# Patient Record
Sex: Female | Born: 1935 | Race: White | Hispanic: No | Marital: Married | Smoking: Never smoker
Health system: Southern US, Community
[De-identification: ages and names within clinical notes are randomized; demographics above are authoritative.]

## PROBLEM LIST (undated history)

## (undated) DIAGNOSIS — F419 Anxiety disorder, unspecified: Secondary | ICD-10-CM

## (undated) DIAGNOSIS — E785 Hyperlipidemia, unspecified: Secondary | ICD-10-CM

## (undated) DIAGNOSIS — F0390 Unspecified dementia without behavioral disturbance: Secondary | ICD-10-CM

## (undated) DIAGNOSIS — T7840XA Allergy, unspecified, initial encounter: Secondary | ICD-10-CM

## (undated) DIAGNOSIS — I10 Essential (primary) hypertension: Secondary | ICD-10-CM

## (undated) HISTORY — PX: VAGINAL HYSTERECTOMY: SUR661

## (undated) HISTORY — DX: Allergy, unspecified, initial encounter: T78.40XA

## (undated) HISTORY — DX: Anxiety disorder, unspecified: F41.9

## (undated) HISTORY — DX: Unspecified dementia, unspecified severity, without behavioral disturbance, psychotic disturbance, mood disturbance, and anxiety: F03.90

## (undated) HISTORY — PX: CATARACT EXTRACTION, BILATERAL: SHX1313

## (undated) HISTORY — PX: EYE SURGERY: SHX253

## (undated) HISTORY — DX: Essential (primary) hypertension: I10

## (undated) HISTORY — DX: Hyperlipidemia, unspecified: E78.5

---

## 2003-01-07 LAB — HM COLONOSCOPY

## 2004-07-01 ENCOUNTER — Ambulatory Visit: Payer: Self-pay | Admitting: Obstetrics and Gynecology

## 2005-07-07 ENCOUNTER — Ambulatory Visit: Payer: Self-pay | Admitting: Obstetrics and Gynecology

## 2006-07-10 ENCOUNTER — Ambulatory Visit: Payer: Self-pay | Admitting: Obstetrics and Gynecology

## 2007-07-16 ENCOUNTER — Ambulatory Visit: Payer: Self-pay | Admitting: Obstetrics and Gynecology

## 2007-12-25 ENCOUNTER — Ambulatory Visit: Payer: Self-pay | Admitting: Ophthalmology

## 2007-12-25 ENCOUNTER — Other Ambulatory Visit: Payer: Self-pay

## 2008-01-01 ENCOUNTER — Ambulatory Visit: Payer: Self-pay | Admitting: Ophthalmology

## 2008-07-17 ENCOUNTER — Ambulatory Visit: Payer: Self-pay | Admitting: Obstetrics and Gynecology

## 2009-04-06 ENCOUNTER — Ambulatory Visit: Payer: Self-pay | Admitting: Ophthalmology

## 2009-07-20 ENCOUNTER — Ambulatory Visit: Payer: Self-pay | Admitting: Obstetrics and Gynecology

## 2010-07-22 ENCOUNTER — Ambulatory Visit: Payer: Self-pay | Admitting: Obstetrics and Gynecology

## 2011-04-15 ENCOUNTER — Ambulatory Visit: Payer: Self-pay | Admitting: Family Medicine

## 2011-07-25 ENCOUNTER — Ambulatory Visit: Payer: Self-pay | Admitting: Obstetrics and Gynecology

## 2012-07-25 ENCOUNTER — Ambulatory Visit: Payer: Self-pay | Admitting: Obstetrics and Gynecology

## 2013-07-30 ENCOUNTER — Ambulatory Visit: Payer: Self-pay | Admitting: Family Medicine

## 2013-08-02 ENCOUNTER — Ambulatory Visit: Payer: Self-pay | Admitting: Family Medicine

## 2014-06-18 LAB — BASIC METABOLIC PANEL
CREATININE: 1.1 mg/dL (ref <=1.1)
GLUCOSE: 111 mg/dL

## 2014-06-18 LAB — LIPID PANEL
CHOLESTEROL: 202 mg/dL — AB (ref 0–200)
HDL: 66 mg/dL (ref 35–70)
LDL Cholesterol: 113 mg/dL
TRIGLYCERIDES: 115 mg/dL (ref 40–160)

## 2014-08-05 ENCOUNTER — Ambulatory Visit: Payer: Self-pay | Admitting: Family Medicine

## 2014-08-05 LAB — HM MAMMOGRAPHY: HM Mammogram: NORMAL

## 2014-11-13 DIAGNOSIS — E7849 Other hyperlipidemia: Secondary | ICD-10-CM | POA: Insufficient documentation

## 2014-11-13 DIAGNOSIS — F419 Anxiety disorder, unspecified: Secondary | ICD-10-CM | POA: Insufficient documentation

## 2014-11-13 DIAGNOSIS — Z0000 Encounter for general adult medical examination without abnormal findings: Secondary | ICD-10-CM | POA: Insufficient documentation

## 2014-11-13 DIAGNOSIS — I10 Essential (primary) hypertension: Secondary | ICD-10-CM | POA: Insufficient documentation

## 2014-11-13 DIAGNOSIS — Z1331 Encounter for screening for depression: Secondary | ICD-10-CM | POA: Insufficient documentation

## 2014-11-13 DIAGNOSIS — J309 Allergic rhinitis, unspecified: Secondary | ICD-10-CM | POA: Insufficient documentation

## 2014-11-13 DIAGNOSIS — N951 Menopausal and female climacteric states: Secondary | ICD-10-CM | POA: Insufficient documentation

## 2014-12-19 ENCOUNTER — Ambulatory Visit: Payer: Self-pay | Admitting: Family Medicine

## 2014-12-23 ENCOUNTER — Encounter: Payer: Self-pay | Admitting: Family Medicine

## 2014-12-23 ENCOUNTER — Ambulatory Visit (INDEPENDENT_AMBULATORY_CARE_PROVIDER_SITE_OTHER): Payer: Medicare Other | Admitting: Family Medicine

## 2014-12-23 VITALS — BP 102/62 | HR 64 | Ht 64.0 in | Wt 169.0 lb

## 2014-12-23 DIAGNOSIS — E784 Other hyperlipidemia: Secondary | ICD-10-CM | POA: Diagnosis not present

## 2014-12-23 DIAGNOSIS — I10 Essential (primary) hypertension: Secondary | ICD-10-CM | POA: Diagnosis not present

## 2014-12-23 DIAGNOSIS — F419 Anxiety disorder, unspecified: Secondary | ICD-10-CM | POA: Diagnosis not present

## 2014-12-23 DIAGNOSIS — J301 Allergic rhinitis due to pollen: Secondary | ICD-10-CM | POA: Diagnosis not present

## 2014-12-23 DIAGNOSIS — E7849 Other hyperlipidemia: Secondary | ICD-10-CM

## 2014-12-23 MED ORDER — METOPROLOL SUCCINATE ER 50 MG PO TB24: 50.0000 mg | ORAL_TABLET | Freq: Every day | ORAL | Status: DC

## 2014-12-23 MED ORDER — CLORAZEPATE DIPOTASSIUM 7.5 MG PO TABS: 7.5000 mg | ORAL_TABLET | Freq: Every day | ORAL | Status: DC

## 2014-12-23 MED ORDER — HYDROCHLOROTHIAZIDE 12.5 MG PO CAPS: 12.5000 mg | ORAL_CAPSULE | Freq: Every day | ORAL | Status: DC

## 2014-12-23 MED ORDER — OMEGA 3 1000 MG PO CAPS: 2.0000 | ORAL_CAPSULE | Freq: Two times a day (BID) | ORAL | Status: DC

## 2014-12-23 MED ORDER — LISINOPRIL 20 MG PO TABS: 20.0000 mg | ORAL_TABLET | Freq: Every day | ORAL | Status: DC

## 2014-12-23 NOTE — Progress Notes (Signed)
Name: Karen Hooper   MRN: [id number]    DOB: 1932-03-18   Date:12/23/2014       Progress Note  Subjective  Chief Complaint  Chief Complaint  Patient presents with  . Hypertension  . Panic Attack  . Allergic Rhinitis     Hypertension This is a chronic problem. The current episode started more than 1 year ago. The problem is unchanged. The problem is controlled. Associated symptoms include anxiety. Pertinent negatives include no blurred vision, chest pain, headaches, malaise/fatigue, neck pain, orthopnea, palpitations, peripheral edema, PND, shortness of breath or sweats. There are no associated agents to hypertension. Risk factors for coronary artery disease include dyslipidemia and post-menopausal state. Past treatments include nothing. The current treatment provides mild improvement. There are no compliance problems.  There is no history of angina, CAD/MI, CVA, heart failure, left ventricular hypertrophy, PVD or retinopathy. There is no history of chronic renal disease.  Hyperlipidemia This is a chronic problem. The current episode started more than 1 year ago. The problem is controlled. Recent lipid tests were reviewed and are variable. She has no history of chronic renal disease. Pertinent negatives include no chest pain, focal weakness, myalgias or shortness of breath. Current antihyperlipidemic treatment includes diet change. The current treatment provides moderate improvement of lipids. There are no compliance problems.   Anxiety Presents for follow-up visit. Symptoms include excessive worry. Patient reports no chest pain, dizziness, insomnia, irritability, malaise, nausea, nervous/anxious behavior, palpitations, shortness of breath or suicidal ideas. Symptoms occur occasionally. The severity of symptoms is mild. The symptoms are aggravated by family issues. The quality of sleep is fair.   There is no history of anxiety/panic attacks. Past treatments include benzodiazephines. Compliance  with prior treatments has been good.    No problem-specific assessment & plan notes found for this encounter.   Past Medical History  Diagnosis Date  . Allergy   . Anxiety   . Hypertension   . Hyperlipidemia     Past Surgical History  Procedure Laterality Date  . Vaginal hysterectomy    . Cataract extraction, bilateral      History reviewed. No pertinent family history.  History   Social History  . Marital Status: Married    Spouse Name: N/A  . Number of Children: N/A  . Years of Education: N/A   Occupational History  . Not on file.   Social History Main Topics  . Smoking status: Never Smoker   . Smokeless tobacco: Not on file  . Alcohol Use: No  . Drug Use: No  . Sexual Activity: Not Currently    Birth Control/ Protection: Abstinence   Other Topics Concern  . Not on file   Social History Narrative    Allergies  Allergen Reactions  . Atorvastatin   . Etodolac   . Ezetimibe      Review of Systems  Constitutional: Negative for fever, chills, weight loss, malaise/fatigue and irritability.  HENT: Negative for ear discharge, ear pain and sore throat.   Eyes: Negative for blurred vision.  Respiratory: Negative for cough, sputum production, shortness of breath and wheezing.   Cardiovascular: Negative for chest pain, palpitations, orthopnea, leg swelling and PND.  Gastrointestinal: Negative for heartburn, nausea, abdominal pain, diarrhea, constipation, blood in stool and melena.  Genitourinary: Negative for dysuria, urgency, frequency and hematuria.  Musculoskeletal: Negative for myalgias, back pain, joint pain and neck pain.  Skin: Negative for rash.  Neurological: Negative for dizziness, tingling, sensory change, focal weakness and headaches.  Endo/Heme/Allergies: Negative  for environmental allergies and polydipsia. Does not bruise/bleed easily.  Psychiatric/Behavioral: Negative for depression and suicidal ideas. The patient is not nervous/anxious and does  not have insomnia.      Objective  Filed Vitals:   12/23/14 0851  BP: 102/62  Pulse: 64  Height: 5\' 4"  (1.626 m)  Weight: 169 lb (76.658 kg)    Physical Exam  Constitutional: She is well-developed, well-nourished, and in no distress. No distress.  HENT:  Head: Normocephalic and atraumatic.  Right Ear: External ear normal.  Left Ear: External ear normal.  Nose: Nose normal.  Mouth/Throat: Oropharynx is clear and moist.  Eyes: Conjunctivae and EOM are normal. Pupils are equal, round, and reactive to light. Right eye exhibits no discharge. Left eye exhibits no discharge.  Neck: Normal range of motion. Neck supple. No JVD present. No thyromegaly present.  Cardiovascular: Normal rate, regular rhythm, normal heart sounds and intact distal pulses.  Exam reveals no gallop and no friction rub.   No murmur heard. Pulmonary/Chest: Effort normal and breath sounds normal. No respiratory distress. She has no wheezes. She has no rales. She exhibits no tenderness.  Abdominal: Soft. Bowel sounds are normal. She exhibits no distension and no mass. There is no tenderness. There is no rebound and no guarding.  Musculoskeletal: Normal range of motion. She exhibits no edema.  Lymphadenopathy:    She has no cervical adenopathy.  Neurological: She is alert. She has normal reflexes. No cranial nerve deficit.  Skin: Skin is warm and dry. No rash noted.  Psychiatric: Mood and affect normal.  Nursing note and vitals reviewed.     No results found for this or any previous visit (from the past 2160 hour(s)).   Assessment & Plan  Problem List Items Addressed This Visit      Cardiovascular and Mediastinum   BP (high blood pressure) - Primary   Relevant Medications   hydrochlorothiazide (MICROZIDE) 12.5 MG capsule   lisinopril (PRINIVIL,ZESTRIL) 20 MG tablet   metoprolol succinate (TOPROL-XL) 50 MG 24 hr tablet   Other Relevant Orders   Renal Function Panel     Respiratory   Allergic rhinitis      Other   Familial multiple lipoprotein-type hyperlipidemia   Relevant Medications   hydrochlorothiazide (MICROZIDE) 12.5 MG capsule   lisinopril (PRINIVIL,ZESTRIL) 20 MG tablet   metoprolol succinate (TOPROL-XL) 50 MG 24 hr tablet   Omega 3 1000 MG CAPS   Other Relevant Orders   Lipid Profile   Anxiety   Relevant Medications   clorazepate (TRANXENE) 7.5 MG tablet        Dr. [facility]  12/23/2014

## 2014-12-24 LAB — RENAL FUNCTION PANEL
Albumin: 4.4 g/dL (ref 3.5–4.7)
BUN/Creatinine Ratio: 28 — ABNORMAL HIGH (ref 11–26)
BUN: 29 mg/dL — ABNORMAL HIGH (ref 8–27)
CALCIUM: 10.7 mg/dL — AB (ref 8.7–10.3)
CO2: 23 mmol/L (ref 18–29)
CREATININE: 1.05 mg/dL — AB (ref 0.57–1.00)
Chloride: 106 mmol/L (ref 97–108)
GFR calc Af Amer: 58 mL/min/{1.73_m2} — ABNORMAL LOW (ref >59)
GFR calc non Af Amer: 50 mL/min/{1.73_m2} — ABNORMAL LOW (ref >59)
GLUCOSE: 113 mg/dL — AB (ref 65–99)
Phosphorus: 3.7 mg/dL (ref 2.5–4.5)
Potassium: 4.5 mmol/L (ref 3.5–5.2)
Sodium: 143 mmol/L (ref 134–144)

## 2014-12-24 LAB — LIPID PANEL
CHOL/HDL RATIO: 3.4 ratio (ref 0.0–4.4)
Cholesterol, Total: 223 mg/dL — ABNORMAL HIGH (ref 100–199)
HDL: 66 mg/dL (ref >39)
LDL CALC: 128 mg/dL — AB (ref 0–99)
TRIGLYCERIDES: 145 mg/dL (ref 0–149)
VLDL Cholesterol Cal: 29 mg/dL (ref 5–40)

## 2015-01-05 ENCOUNTER — Encounter: Payer: Self-pay | Admitting: Family Medicine

## 2015-01-05 ENCOUNTER — Ambulatory Visit (INDEPENDENT_AMBULATORY_CARE_PROVIDER_SITE_OTHER): Payer: Medicare Other | Admitting: Family Medicine

## 2015-01-05 VITALS — BP 120/80 | HR 72 | Temp 98.1°F | Ht 64.0 in | Wt 169.0 lb

## 2015-01-05 DIAGNOSIS — J0100 Acute maxillary sinusitis, unspecified: Secondary | ICD-10-CM | POA: Diagnosis not present

## 2015-01-05 DIAGNOSIS — J029 Acute pharyngitis, unspecified: Secondary | ICD-10-CM

## 2015-01-05 MED ORDER — AMOXICILLIN 500 MG PO CAPS: 500.0000 mg | ORAL_CAPSULE | Freq: Three times a day (TID) | ORAL | Status: DC

## 2015-01-05 NOTE — Progress Notes (Signed)
Name: Karen Hooper   MRN: [id number]    DOB: 06/14/1932   Date:01/05/2015       Progress Note  Subjective  Chief Complaint  Chief Complaint  Patient presents with  . Sinusitis    headache, runnny nose and sore throat     Sinusitis This is a recurrent problem. The current episode started in the past 7 days. The problem has been waxing and waning since onset. There has been no fever. She is experiencing no pain. Associated symptoms include chills, neck pain and sinus pressure. Pertinent negatives include no congestion, coughing, diaphoresis, ear pain, headaches, hoarse voice, shortness of breath, sneezing, sore throat or swollen glands. Past treatments include acetaminophen. The treatment provided mild relief.  Sore Throat  This is a new problem. The current episode started in the past 7 days. The problem has been gradually worsening. There has been no fever. Associated symptoms include neck pain. Pertinent negatives include no congestion, coughing, diarrhea, ear pain, headaches, hoarse voice, shortness of breath or swollen glands. She has had no exposure to strep. She has tried acetaminophen for the symptoms. The treatment provided mild relief.    No problem-specific assessment & plan notes found for this encounter.   Past Medical History  Diagnosis Date  . Allergy   . Anxiety   . Hypertension   . Hyperlipidemia     Past Surgical History  Procedure Laterality Date  . Vaginal hysterectomy    . Cataract extraction, bilateral      History reviewed. No pertinent family history.  History   Social History  . Marital Status: Married    Spouse Name: N/A  . Number of Children: N/A  . Years of Education: N/A   Occupational History  . Not on file.   Social History Main Topics  . Smoking status: Never Smoker   . Smokeless tobacco: Not on file  . Alcohol Use: No  . Drug Use: No  . Sexual Activity: Not Currently    Birth Control/ Protection: Abstinence   Other Topics  Concern  . Not on file   Social History Narrative    Allergies  Allergen Reactions  . Atorvastatin   . Etodolac   . Ezetimibe      Review of Systems  Constitutional: Positive for chills. Negative for fever, weight loss and diaphoresis.  HENT: Positive for sinus pressure. Negative for congestion, ear pain, hoarse voice, sneezing and sore throat.   Eyes: Negative for blurred vision, double vision and redness.  Respiratory: Negative for cough, hemoptysis, sputum production, shortness of breath and wheezing.   Cardiovascular: Negative for chest pain, palpitations, orthopnea and claudication.  Gastrointestinal: Negative for nausea, diarrhea and melena.  Genitourinary: Negative for dysuria, urgency, frequency and hematuria.  Musculoskeletal: Positive for neck pain. Negative for myalgias.  Skin: Negative for rash.  Neurological: Negative for dizziness, weakness and headaches.  Endo/Heme/Allergies: Negative for environmental allergies. Does not bruise/bleed easily.     Objective  Filed Vitals:   01/05/15 0906  BP: 120/80  Pulse: 72  Temp: 98.1 F (36.7 C)  Height: 5\' 4"  (1.626 m)  Weight: 169 lb (76.658 kg)    Physical Exam  Constitutional: She is well-developed, well-nourished, and in no distress. No distress.  HENT:  Head: Normocephalic and atraumatic.  Right Ear: External ear normal.  Left Ear: External ear normal.  Nose: Nose normal.  Mouth/Throat: Uvula is midline and mucous membranes are normal. Posterior oropharyngeal erythema present. No oropharyngeal exudate or posterior oropharyngeal edema.  Eyes:  Conjunctivae and EOM are normal. Pupils are equal, round, and reactive to light. Right eye exhibits no discharge. Left eye exhibits no discharge.  Neck: Normal range of motion. Neck supple. No JVD present. No thyromegaly present.  Cardiovascular: Normal rate, regular rhythm, normal heart sounds and intact distal pulses.  Exam reveals no gallop and no friction rub.   No  murmur heard. Pulmonary/Chest: Effort normal and breath sounds normal.  Abdominal: Soft. Bowel sounds are normal. She exhibits no mass. There is no tenderness. There is no guarding.  Musculoskeletal: Normal range of motion. She exhibits no edema.  Lymphadenopathy:    She has no cervical adenopathy.  Neurological: She is alert. She has normal reflexes.  Skin: Skin is warm and dry. She is not diaphoretic.  Psychiatric: Mood and affect normal.      Assessment & Plan  Problem List Items Addressed This Visit    None    Visit Diagnoses    Pharyngitis    -  Primary    Relevant Medications    amoxicillin (AMOXIL) 500 MG capsule    Acute maxillary sinusitis, recurrence not specified        Relevant Medications    amoxicillin (AMOXIL) 500 MG capsule         Dr. [facility]  01/05/2015

## 2015-02-17 ENCOUNTER — Encounter: Payer: Self-pay | Admitting: Family Medicine

## 2015-02-17 ENCOUNTER — Ambulatory Visit (INDEPENDENT_AMBULATORY_CARE_PROVIDER_SITE_OTHER): Payer: Medicare Other | Admitting: Family Medicine

## 2015-02-17 VITALS — BP 100/60 | HR 80 | Ht 64.0 in | Wt 168.0 lb

## 2015-02-17 DIAGNOSIS — J029 Acute pharyngitis, unspecified: Secondary | ICD-10-CM | POA: Diagnosis not present

## 2015-02-17 MED ORDER — AZITHROMYCIN 250 MG PO TABS: ORAL_TABLET | ORAL | Status: DC

## 2015-02-17 NOTE — Progress Notes (Signed)
Name: Karen Hooper   MRN: [id number]    DOB: 1931-12-09   Date:02/17/2015       Progress Note  Subjective  Chief Complaint  Chief Complaint  Patient presents with  . Sore Throat    hurts to swallow x 1 day    Sore Throat  This is a new problem. The current episode started yesterday. The problem has been gradually worsening. There has been no fever. Associated symptoms include congestion, coughing and ear pain. Pertinent negatives include no abdominal pain, diarrhea, drooling, ear discharge, headaches, hoarse voice, plugged ear sensation, neck pain, shortness of breath, swollen glands, trouble swallowing or vomiting. She has had no exposure to strep or mono. She has tried acetaminophen for the symptoms. The treatment provided mild relief.    No problem-specific assessment & plan notes found for this encounter.   Past Medical History  Diagnosis Date  . Allergy   . Anxiety   . Hypertension   . Hyperlipidemia     Past Surgical History  Procedure Laterality Date  . Vaginal hysterectomy    . Cataract extraction, bilateral      History reviewed. No pertinent family history.  History   Social History  . Marital Status: Married    Spouse Name: N/A  . Number of Children: N/A  . Years of Education: N/A   Occupational History  . Not on file.   Social History Main Topics  . Smoking status: Never Smoker   . Smokeless tobacco: Not on file  . Alcohol Use: No  . Drug Use: No  . Sexual Activity: Not Currently    Birth Control/ Protection: Abstinence   Other Topics Concern  . Not on file   Social History Narrative    Allergies  Allergen Reactions  . Atorvastatin   . Etodolac   . Ezetimibe      Review of Systems  Constitutional: Negative for fever, chills, weight loss and malaise/fatigue.  HENT: Positive for congestion and ear pain. Negative for drooling, ear discharge, hoarse voice, sore throat and trouble swallowing.   Eyes: Negative for blurred vision.   Respiratory: Positive for cough. Negative for sputum production, shortness of breath and wheezing.   Cardiovascular: Negative for chest pain, palpitations and leg swelling.  Gastrointestinal: Negative for heartburn, nausea, vomiting, abdominal pain, diarrhea, constipation, blood in stool and melena.  Genitourinary: Negative for dysuria, urgency, frequency and hematuria.  Musculoskeletal: Negative for myalgias, back pain, joint pain and neck pain.  Skin: Negative for rash.  Neurological: Negative for dizziness, tingling, sensory change, focal weakness and headaches.  Endo/Heme/Allergies: Negative for environmental allergies and polydipsia. Does not bruise/bleed easily.  Psychiatric/Behavioral: Negative for depression and suicidal ideas. The patient is not nervous/anxious and does not have insomnia.      Objective  Filed Vitals:   02/17/15 1416  BP: 100/60  Pulse: 80  Height:  (1.626 m)  Weight: 168 lb (76.204 kg)    Physical Exam  Constitutional: She is well-developed, well-nourished, and in no distress. No distress.  HENT:  Head: Normocephalic and atraumatic.  Right Ear: External ear normal.  Left Ear: External ear normal.  Nose: Nose normal.  Mouth/Throat: Posterior oropharyngeal edema and posterior oropharyngeal erythema present. No oropharyngeal exudate.  Eyes: Conjunctivae and EOM are normal. Pupils are equal, round, and reactive to light. Right eye exhibits no discharge. Left eye exhibits no discharge.  Neck: Normal range of motion. Neck supple. No JVD present. No thyromegaly present.  Cardiovascular: Normal rate, regular rhythm, normal  heart sounds and intact distal pulses.  Exam reveals no gallop and no friction rub.   No murmur heard. Pulmonary/Chest: Effort normal and breath sounds normal.  Abdominal: Soft. Bowel sounds are normal. She exhibits no mass. There is no tenderness. There is no guarding.  Musculoskeletal: Normal range of motion. She exhibits no edema.   Lymphadenopathy:    She has no cervical adenopathy.  Neurological: She is alert. She has normal reflexes.  Skin: Skin is warm and dry. She is not diaphoretic.  Psychiatric: Mood and affect normal.      Assessment & Plan  Problem List Items Addressed This Visit    None    Visit Diagnoses    Pharyngitis    -  Primary    Relevant Medications    azithromycin (ZITHROMAX) 250 MG tablet         Dr. [facility]  02/17/2015

## 2015-03-13 ENCOUNTER — Other Ambulatory Visit: Payer: Self-pay | Admitting: Family Medicine

## 2015-03-13 DIAGNOSIS — I10 Essential (primary) hypertension: Secondary | ICD-10-CM

## 2015-04-21 ENCOUNTER — Ambulatory Visit (INDEPENDENT_AMBULATORY_CARE_PROVIDER_SITE_OTHER): Payer: Medicare Other

## 2015-04-21 DIAGNOSIS — Z23 Encounter for immunization: Secondary | ICD-10-CM | POA: Diagnosis not present

## 2015-05-21 ENCOUNTER — Encounter: Payer: Self-pay | Admitting: Family Medicine

## 2015-05-21 ENCOUNTER — Ambulatory Visit (INDEPENDENT_AMBULATORY_CARE_PROVIDER_SITE_OTHER): Payer: Medicare Other | Admitting: Family Medicine

## 2015-05-21 VITALS — BP 120/60 | HR 68 | Temp 99.2°F | Ht 64.0 in | Wt 167.0 lb

## 2015-05-21 DIAGNOSIS — J0100 Acute maxillary sinusitis, unspecified: Secondary | ICD-10-CM | POA: Diagnosis not present

## 2015-05-21 DIAGNOSIS — J40 Bronchitis, not specified as acute or chronic: Secondary | ICD-10-CM | POA: Diagnosis not present

## 2015-05-21 MED ORDER — AMOXICILLIN 500 MG PO CAPS: 500.0000 mg | ORAL_CAPSULE | Freq: Three times a day (TID) | ORAL | Status: DC

## 2015-05-21 MED ORDER — GUAIFENESIN-CODEINE 100-10 MG/5ML PO SOLN
5.0000 mL | Freq: Three times a day (TID) | ORAL | Status: DC | PRN
Start: 2015-05-21 — End: 2015-06-24

## 2015-05-21 NOTE — Progress Notes (Signed)
Name: Karen Hooper   MRN: [id number]    DOB: [birth year]   Date:05/21/2015       Progress Note  Subjective  Chief Complaint  Chief Complaint  Patient presents with  . Sinusitis    Sinusitis This is a new problem. The current episode started in the past 7 days. The problem has been gradually worsening since onset. The maximum temperature recorded prior to her arrival was 100.4 - 100.9 F. The fever has been present for 1 to 2 days. The pain is mild. Associated symptoms include congestion, coughing, shortness of breath, sinus pressure and swollen glands. Pertinent negatives include no chills, diaphoresis, ear pain, headaches, hoarse voice, neck pain, sneezing or sore throat. Past treatments include acetaminophen.    No problem-specific assessment & plan notes found for this encounter.   Past Medical History  Diagnosis Date  . Allergy   . Anxiety   . Hypertension   . Hyperlipidemia     Past Surgical History  Procedure Laterality Date  . Vaginal hysterectomy    . Cataract extraction, bilateral      History reviewed. No pertinent family history.  Social History   Social History  . Marital Status: Married    Spouse Name: N/A  . Number of Children: N/A  . Years of Education: N/A   Occupational History  . Not on file.   Social History Main Topics  . Smoking status: Never Smoker   . Smokeless tobacco: Not on file  . Alcohol Use: No  . Drug Use: No  . Sexual Activity: Not Currently    Birth Control/ Protection: Abstinence   Other Topics Concern  . Not on file   Social History Narrative    Allergies  Allergen Reactions  . Atorvastatin   . Etodolac   . Ezetimibe      Review of Systems  Constitutional: Negative for fever, chills, weight loss, malaise/fatigue and diaphoresis.  HENT: Positive for congestion and sinus pressure. Negative for ear discharge, ear pain, hoarse voice, sneezing and sore throat.   Eyes: Negative for blurred vision.  Respiratory:  Positive for cough and shortness of breath. Negative for sputum production and wheezing.   Cardiovascular: Negative for chest pain, palpitations and leg swelling.  Gastrointestinal: Negative for heartburn, nausea, abdominal pain, diarrhea, constipation, blood in stool and melena.  Genitourinary: Negative for dysuria, urgency, frequency and hematuria.  Musculoskeletal: Negative for myalgias, back pain, joint pain and neck pain.  Skin: Negative for rash.  Neurological: Negative for dizziness, tingling, sensory change, focal weakness and headaches.  Endo/Heme/Allergies: Negative for environmental allergies and polydipsia. Does not bruise/bleed easily.  Psychiatric/Behavioral: Negative for depression and suicidal ideas. The patient is not nervous/anxious and does not have insomnia.      Objective  Filed Vitals:   05/21/15 1100  BP: 120/60  Pulse: 68  Temp: 99.2 F (37.3 C)  TempSrc: Oral  Height:  (1.626 m)  Weight: 167 lb (75.751 kg)    Physical Exam  Constitutional: She is well-developed, well-nourished, and in no distress. No distress.  HENT:  Head: Normocephalic and atraumatic.  Right Ear: External ear normal.  Left Ear: External ear normal.  Nose: Nose normal.  Mouth/Throat: Oropharynx is clear and moist.  Eyes: Conjunctivae and EOM are normal. Pupils are equal, round, and reactive to light. Right eye exhibits no discharge. Left eye exhibits no discharge.  Neck: Normal range of motion. Neck supple. No JVD present. No thyromegaly present.  Cardiovascular: Normal rate, regular rhythm, normal heart  sounds and intact distal pulses.  Exam reveals no gallop and no friction rub.   No murmur heard. Pulmonary/Chest: Effort normal and breath sounds normal.  Abdominal: Soft. Bowel sounds are normal. She exhibits no mass. There is no tenderness. There is no guarding.  Musculoskeletal: Normal range of motion. She exhibits no edema.  Lymphadenopathy:    She has no cervical  adenopathy.  Neurological: She is alert.  Skin: Skin is warm and dry. She is not diaphoretic.  Psychiatric: Mood and affect normal.  Nursing note and vitals reviewed.     Assessment & Plan  Problem List Items Addressed This Visit    None    Visit Diagnoses    Acute maxillary sinusitis, recurrence not specified    -  Primary    Relevant Medications    amoxicillin (AMOXIL) 500 MG capsule    guaiFENesin-codeine 100-10 MG/5ML syrup    Bronchitis        Relevant Medications    amoxicillin (AMOXIL) 500 MG capsule    guaiFENesin-codeine 100-10 MG/5ML syrup         Dr. Hayden Rasmusseneanna Denyla Cortese [facility]  05/21/2015

## 2015-06-24 ENCOUNTER — Encounter: Payer: Self-pay | Admitting: Family Medicine

## 2015-06-24 ENCOUNTER — Ambulatory Visit (INDEPENDENT_AMBULATORY_CARE_PROVIDER_SITE_OTHER): Payer: Medicare Other | Admitting: Family Medicine

## 2015-06-24 VITALS — BP 100/62 | HR 70 | Ht 64.0 in | Wt 167.0 lb

## 2015-06-24 DIAGNOSIS — F419 Anxiety disorder, unspecified: Secondary | ICD-10-CM | POA: Diagnosis not present

## 2015-06-24 DIAGNOSIS — I10 Essential (primary) hypertension: Secondary | ICD-10-CM | POA: Diagnosis not present

## 2015-06-24 DIAGNOSIS — E7849 Other hyperlipidemia: Secondary | ICD-10-CM

## 2015-06-24 DIAGNOSIS — E784 Other hyperlipidemia: Secondary | ICD-10-CM

## 2015-06-24 MED ORDER — LISINOPRIL 20 MG PO TABS: ORAL_TABLET | ORAL | Status: DC

## 2015-06-24 MED ORDER — HYDROCHLOROTHIAZIDE 12.5 MG PO CAPS: 12.5000 mg | ORAL_CAPSULE | Freq: Every day | ORAL | Status: DC

## 2015-06-24 MED ORDER — CLORAZEPATE DIPOTASSIUM 7.5 MG PO TABS: 7.5000 mg | ORAL_TABLET | Freq: Every day | ORAL | Status: DC

## 2015-06-24 MED ORDER — METOPROLOL SUCCINATE ER 50 MG PO TB24: 50.0000 mg | ORAL_TABLET | Freq: Every day | ORAL | Status: DC

## 2015-06-24 NOTE — Progress Notes (Signed)
Name: Karen Hooper   MRN: [id number]    DOB: 07/20/1931   Date:06/24/2015       Progress Note  Subjective  Chief Complaint  Chief Complaint  Patient presents with  . Hypertension  . Allergic Rhinitis   . Anxiety    Hypertension This is a chronic problem. The current episode started more than 1 year ago. The problem has been gradually improving since onset. The problem is controlled. Associated symptoms include anxiety. Pertinent negatives include no blurred vision, chest pain, headaches, malaise/fatigue, neck pain, orthopnea, palpitations, peripheral edema, PND, shortness of breath or sweats. There are no associated agents to hypertension. There are no known risk factors for coronary artery disease. Past treatments include beta blockers, ACE inhibitors and diuretics. The current treatment provides moderate improvement. There are no compliance problems.  There is no history of angina, kidney disease, CAD/MI, CVA, heart failure, left ventricular hypertrophy, PVD or retinopathy. There is no history of chronic renal disease or a hypertension causing med.  Anxiety Presents for follow-up visit. The problem has been gradually improving. Symptoms include excessive worry, irritability and nervous/anxious behavior. Patient reports no chest pain, compulsions, confusion, decreased concentration, depressed mood, dizziness, dry mouth, feeling of choking, hyperventilation, impotence, insomnia, malaise, muscle tension, nausea, obsessions, palpitations, panic, restlessness, shortness of breath or suicidal ideas. Symptoms occur most days. The severity of symptoms is moderate. Exacerbated by: stress. The quality of sleep is good. Nighttime awakenings: none.   There is no history of anemia, anxiety/panic attacks, arrhythmia, asthma, bipolar disorder, CAD, CHF, chronic lung disease, depression, fibromyalgia, hyperthyroidism or suicide attempts. Past treatments include benzodiazephines. Compliance with prior  treatments has been good.    No problem-specific assessment & plan notes found for this encounter.   Past Medical History  Diagnosis Date  . Allergy   . Anxiety   . Hypertension   . Hyperlipidemia     Past Surgical History  Procedure Laterality Date  . Vaginal hysterectomy    . Cataract extraction, bilateral      History reviewed. No pertinent family history.  Social History   Social History  . Marital Status: Married    Spouse Name: N/A  . Number of Children: N/A  . Years of Education: N/A   Occupational History  . Not on file.   Social History Main Topics  . Smoking status: Never Smoker   . Smokeless tobacco: Not on file  . Alcohol Use: No  . Drug Use: No  . Sexual Activity: Not Currently    Birth Control/ Protection: Abstinence   Other Topics Concern  . Not on file   Social History Narrative    Allergies  Allergen Reactions  . Atorvastatin   . Etodolac   . Ezetimibe      Review of Systems  Constitutional: Positive for irritability. Negative for fever, chills, weight loss and malaise/fatigue.  HENT: Negative for ear discharge, ear pain and sore throat.   Eyes: Negative for blurred vision.  Respiratory: Negative for cough, sputum production, shortness of breath and wheezing.   Cardiovascular: Negative for chest pain, palpitations, orthopnea, leg swelling and PND.  Gastrointestinal: Negative for heartburn, nausea, abdominal pain, diarrhea, constipation, blood in stool and melena.  Genitourinary: Negative for dysuria, urgency, frequency, hematuria and impotence.  Musculoskeletal: Negative for myalgias, back pain, joint pain and neck pain.  Skin: Negative for rash.  Neurological: Negative for dizziness, tingling, sensory change, focal weakness and headaches.  Endo/Heme/Allergies: Negative for environmental allergies and polydipsia. Does not bruise/bleed easily.  Psychiatric/Behavioral: Negative for depression, suicidal ideas, confusion and decreased  concentration. The patient is nervous/anxious. The patient does not have insomnia.      Objective  Filed Vitals:   06/24/15 0802  BP: 100/62  Pulse: 70  Height:  (1.626 m)  Weight: 167 lb (75.751 kg)    Physical Exam  Constitutional: She is well-developed, well-nourished, and in no distress. No distress.  HENT:  Head: Normocephalic and atraumatic.  Right Ear: External ear normal.  Left Ear: External ear normal.  Nose: Nose normal.  Mouth/Throat: Oropharynx is clear and moist.  Eyes: Conjunctivae and EOM are normal. Pupils are equal, round, and reactive to light. Right eye exhibits no discharge. Left eye exhibits no discharge.  Neck: Normal range of motion. Neck supple. No JVD present. No thyromegaly present.  Cardiovascular: Normal rate, regular rhythm, normal heart sounds and intact distal pulses.  Exam reveals no gallop and no friction rub.   No murmur heard. Pulmonary/Chest: Effort normal and breath sounds normal.  Abdominal: Soft. Bowel sounds are normal. She exhibits no mass. There is no tenderness. There is no guarding.  Musculoskeletal: Normal range of motion. She exhibits no edema.  Lymphadenopathy:    She has no cervical adenopathy.  Neurological: She is alert.  Skin: Skin is warm and dry. She is not diaphoretic.  Psychiatric: Mood and affect normal.      Assessment & Plan  Problem List Items Addressed This Visit      Cardiovascular and Mediastinum   BP (high blood pressure) - Primary   Relevant Medications   lisinopril (PRINIVIL,ZESTRIL) 20 MG tablet   metoprolol succinate (TOPROL-XL) 50 MG 24 hr tablet   hydrochlorothiazide (MICROZIDE) 12.5 MG capsule   Other Relevant Orders   Renal Function Panel     Other   Familial multiple lipoprotein-type hyperlipidemia   Relevant Medications   lisinopril (PRINIVIL,ZESTRIL) 20 MG tablet   metoprolol succinate (TOPROL-XL) 50 MG 24 hr tablet   hydrochlorothiazide (MICROZIDE) 12.5 MG capsule   Other Relevant  Orders   Lipid Profile   Anxiety   Relevant Medications   clorazepate (TRANXENE) 7.5 MG tablet        Dr. [facility]  06/24/2015

## 2015-06-25 ENCOUNTER — Encounter: Payer: Self-pay | Admitting: Family Medicine

## 2015-06-25 DIAGNOSIS — N183 Chronic kidney disease, stage 3 unspecified: Secondary | ICD-10-CM | POA: Insufficient documentation

## 2015-06-25 LAB — RENAL FUNCTION PANEL
Albumin: 4.2 g/dL (ref 3.5–4.7)
BUN / CREAT RATIO: 28 — AB (ref 11–26)
BUN: 32 mg/dL — AB (ref 8–27)
CO2: 23 mmol/L (ref 18–29)
CREATININE: 1.14 mg/dL — AB (ref 0.57–1.00)
Calcium: 10 mg/dL (ref 8.7–10.3)
Chloride: 106 mmol/L (ref 96–106)
GFR, EST AFRICAN AMERICAN: 52 mL/min/{1.73_m2} — AB (ref >59)
GFR, EST NON AFRICAN AMERICAN: 45 mL/min/{1.73_m2} — AB (ref >59)
Glucose: 94 mg/dL (ref 65–99)
Phosphorus: 3.4 mg/dL (ref 2.5–4.5)
Potassium: 4.7 mmol/L (ref 3.5–5.2)
Sodium: 144 mmol/L (ref 134–144)

## 2015-06-25 LAB — LIPID PANEL
CHOL/HDL RATIO: 3.2 ratio (ref 0.0–4.4)
Cholesterol, Total: 206 mg/dL — ABNORMAL HIGH (ref 100–199)
HDL: 64 mg/dL (ref >39)
LDL CALC: 121 mg/dL — AB (ref 0–99)
Triglycerides: 103 mg/dL (ref 0–149)
VLDL CHOLESTEROL CAL: 21 mg/dL (ref 5–40)

## 2015-07-27 ENCOUNTER — Other Ambulatory Visit: Payer: Self-pay

## 2015-07-27 DIAGNOSIS — Z1231 Encounter for screening mammogram for malignant neoplasm of breast: Secondary | ICD-10-CM

## 2015-08-06 ENCOUNTER — Encounter: Payer: Self-pay | Admitting: Family Medicine

## 2015-08-06 ENCOUNTER — Ambulatory Visit (INDEPENDENT_AMBULATORY_CARE_PROVIDER_SITE_OTHER): Payer: Medicare Other | Admitting: Family Medicine

## 2015-08-06 VITALS — BP 120/62 | HR 62 | Ht 64.0 in | Wt 163.0 lb

## 2015-08-06 DIAGNOSIS — M545 Low back pain: Secondary | ICD-10-CM | POA: Diagnosis not present

## 2015-08-06 DIAGNOSIS — S338XXA Sprain of other parts of lumbar spine and pelvis, initial encounter: Secondary | ICD-10-CM | POA: Diagnosis not present

## 2015-08-06 DIAGNOSIS — S39012A Strain of muscle, fascia and tendon of lower back, initial encounter: Secondary | ICD-10-CM

## 2015-08-06 LAB — POCT URINALYSIS DIPSTICK
Bilirubin, UA: NEGATIVE
GLUCOSE UA: NEGATIVE
Ketones, UA: NEGATIVE
Leukocytes, UA: NEGATIVE
NITRITE UA: NEGATIVE
PH UA: 6
PROTEIN UA: NEGATIVE
SPEC GRAV UA: 1.01
UROBILINOGEN UA: 0.2

## 2015-08-06 MED ORDER — TRAMADOL HCL 50 MG PO TABS: 50.0000 mg | ORAL_TABLET | Freq: Three times a day (TID) | ORAL | Status: DC | PRN

## 2015-08-06 NOTE — Progress Notes (Signed)
Name: Karen Hooper   MRN: [id number]    DOB: May 10, 1932   Date:08/06/2015       Progress Note  Subjective  Chief Complaint  Chief Complaint  Patient presents with  . Back Pain    hurting in R) lower back- stabbing pain when she starts to walk in the am- started hurting when taking down the christmas tree approx 3 weeks ago    Back Pain This is a new problem. The current episode started 1 to 4 weeks ago. The problem occurs daily. The problem is unchanged. The pain is present in the lumbar spine. The quality of the pain is described as aching. Radiates to: right gluteus. The pain is at a severity of 2/10. The pain is mild. The pain is worse during the day. Stiffness is present in the morning. Pertinent negatives include no abdominal pain, bladder incontinence, bowel incontinence, chest pain, dysuria, fever, headaches, leg pain, numbness, paresis, paresthesias, pelvic pain, perianal numbness, tingling, weakness or weight loss. She has tried NSAIDs for the symptoms. The treatment provided mild relief.    No problem-specific assessment & plan notes found for this encounter.   Past Medical History  Diagnosis Date  . Allergy   . Anxiety   . Hypertension   . Hyperlipidemia     Past Surgical History  Procedure Laterality Date  . Vaginal hysterectomy    . Cataract extraction, bilateral      History reviewed. No pertinent family history.  Social History   Social History  . Marital Status: Married    Spouse Name: N/A  . Number of Children: N/A  . Years of Education: N/A   Occupational History  . Not on file.   Social History Main Topics  . Smoking status: Never Smoker   . Smokeless tobacco: Not on file  . Alcohol Use: No  . Drug Use: No  . Sexual Activity: Not Currently    Birth Control/ Protection: Abstinence   Other Topics Concern  . Not on file   Social History Narrative    Allergies  Allergen Reactions  . Atorvastatin   . Etodolac   . Ezetimibe       Review of Systems  Constitutional: Negative for fever, chills, weight loss and malaise/fatigue.  HENT: Negative for ear discharge, ear pain and sore throat.   Eyes: Negative for blurred vision.  Respiratory: Negative for cough, sputum production, shortness of breath and wheezing.   Cardiovascular: Negative for chest pain, palpitations and leg swelling.  Gastrointestinal: Negative for heartburn, nausea, abdominal pain, diarrhea, constipation, blood in stool, melena and bowel incontinence.  Genitourinary: Negative for bladder incontinence, dysuria, urgency, frequency, hematuria and pelvic pain.  Musculoskeletal: Positive for back pain. Negative for myalgias, joint pain and neck pain.  Skin: Negative for rash.  Neurological: Negative for dizziness, tingling, sensory change, focal weakness, weakness, numbness, headaches and paresthesias.  Endo/Heme/Allergies: Negative for environmental allergies and polydipsia. Does not bruise/bleed easily.  Psychiatric/Behavioral: Negative for depression and suicidal ideas. The patient is not nervous/anxious and does not have insomnia.      Objective  Filed Vitals:   08/06/15 1551  BP: 120/62  Pulse: 62  Height:  (1.626 m)  Weight: 163 lb (73.936 kg)    Physical Exam  Constitutional: She is well-developed, well-nourished, and in no distress. No distress.  HENT:  Head: Normocephalic and atraumatic.  Right Ear: External ear normal.  Left Ear: External ear normal.  Nose: Nose normal.  Mouth/Throat: Oropharynx is clear and moist.  Eyes: Conjunctivae and EOM are normal. Pupils are equal, round, and reactive to light. Right eye exhibits no discharge. Left eye exhibits no discharge.  Neck: Normal range of motion. Neck supple. No JVD present. No thyromegaly present.  Cardiovascular: Normal rate, regular rhythm, normal heart sounds and intact distal pulses.  Exam reveals no gallop and no friction rub.   No murmur heard. Pulmonary/Chest: Effort  normal and breath sounds normal. No respiratory distress. She has no wheezes. She has no rales.  Abdominal: Soft. Bowel sounds are normal. She exhibits no mass. There is no tenderness. There is no guarding.  Musculoskeletal: Normal range of motion. She exhibits no edema.  Lymphadenopathy:    She has no cervical adenopathy.  Neurological: She is alert. She has normal reflexes.  Skin: Skin is warm and dry. She is not diaphoretic.  Psychiatric: Mood and affect normal.      Assessment & Plan  Problem List Items Addressed This Visit    None    Visit Diagnoses    Right low back pain, with sciatica presence unspecified    -  Primary    Relevant Medications    traMADol (ULTRAM) 50 MG tablet    Other Relevant Orders    POCT urinalysis dipstick (Completed)    Lumbosacral strain, initial encounter        Relevant Medications    traMADol (ULTRAM) 50 MG tablet         Dr. [facility]  08/06/2015

## 2015-08-07 ENCOUNTER — Ambulatory Visit: Payer: Medicare Other | Admitting: Family Medicine

## 2015-08-07 ENCOUNTER — Ambulatory Visit
Admission: RE | Admit: 2015-08-07 | Discharge: 2015-08-07 | Disposition: A | Discharge status: Home / Self Care | Payer: Medicare Other | Source: Ambulatory Visit | Attending: Family Medicine | Admitting: Family Medicine

## 2015-08-07 DIAGNOSIS — Z1231 Encounter for screening mammogram for malignant neoplasm of breast: Secondary | ICD-10-CM | POA: Insufficient documentation

## 2015-12-23 ENCOUNTER — Encounter: Payer: Self-pay | Admitting: Family Medicine

## 2015-12-23 ENCOUNTER — Ambulatory Visit (INDEPENDENT_AMBULATORY_CARE_PROVIDER_SITE_OTHER): Payer: Medicare Other | Admitting: Family Medicine

## 2015-12-23 VITALS — BP 120/60 | HR 70 | Ht 64.0 in | Wt 168.0 lb

## 2015-12-23 DIAGNOSIS — I10 Essential (primary) hypertension: Secondary | ICD-10-CM

## 2015-12-23 DIAGNOSIS — M1712 Unilateral primary osteoarthritis, left knee: Secondary | ICD-10-CM

## 2015-12-23 DIAGNOSIS — E784 Other hyperlipidemia: Secondary | ICD-10-CM | POA: Diagnosis not present

## 2015-12-23 DIAGNOSIS — F419 Anxiety disorder, unspecified: Secondary | ICD-10-CM | POA: Diagnosis not present

## 2015-12-23 DIAGNOSIS — Z23 Encounter for immunization: Secondary | ICD-10-CM

## 2015-12-23 DIAGNOSIS — E7849 Other hyperlipidemia: Secondary | ICD-10-CM

## 2015-12-23 MED ORDER — TRAMADOL HCL 50 MG PO TABS
50.0000 mg | ORAL_TABLET | Freq: Three times a day (TID) | ORAL | Status: DC | PRN
Start: 2015-12-23 — End: 2016-05-02

## 2015-12-23 MED ORDER — OMEGA 3 1000 MG PO CAPS: 2.0000 | ORAL_CAPSULE | Freq: Two times a day (BID) | ORAL | Status: DC

## 2015-12-23 MED ORDER — HYDROCHLOROTHIAZIDE 12.5 MG PO CAPS: 12.5000 mg | ORAL_CAPSULE | Freq: Every day | ORAL | Status: DC

## 2015-12-23 MED ORDER — METOPROLOL SUCCINATE ER 50 MG PO TB24: 50.0000 mg | ORAL_TABLET | Freq: Every day | ORAL | Status: DC

## 2015-12-23 MED ORDER — LISINOPRIL 20 MG PO TABS: ORAL_TABLET | ORAL | Status: DC

## 2015-12-23 MED ORDER — CLORAZEPATE DIPOTASSIUM 7.5 MG PO TABS: 7.5000 mg | ORAL_TABLET | Freq: Every day | ORAL | Status: DC

## 2015-12-23 NOTE — Progress Notes (Signed)
Name: Karen Hooper   MRN: [id number]    DOB: 1931/10/07   Date:12/23/2015       Progress Note  Subjective  Chief Complaint  Chief Complaint  Patient presents with  . Allergic Rhinitis   . Hypertension  . Anxiety    Hypertension This is a chronic problem. The current episode started more than 1 year ago. The problem has been gradually improving since onset. The problem is controlled. Associated symptoms include anxiety. Pertinent negatives include no blurred vision, chest pain, headaches, malaise/fatigue, neck pain, orthopnea, palpitations, peripheral edema, PND, shortness of breath or sweats. There are no associated agents to hypertension. There are no known risk factors for coronary artery disease. Past treatments include ACE inhibitors, beta blockers and diuretics. The current treatment provides mild improvement. There are no compliance problems.  There is no history of angina, kidney disease, CAD/MI, CVA, heart failure, left ventricular hypertrophy, PVD, renovascular disease or retinopathy. There is no history of chronic renal disease or a hypertension causing med.  Anxiety Presents for follow-up visit. Onset was at an unknown time. The problem has been waxing and waning. Symptoms include excessive worry. Patient reports no chest pain, dizziness, feeling of choking, insomnia, irritability, nausea, nervous/anxious behavior, palpitations, panic, shortness of breath or suicidal ideas. Symptoms occur most days. The quality of sleep is good.   The treatment provided no relief. Compliance with prior treatments has been good.    No problem-specific assessment & plan notes found for this encounter.   Past Medical History  Diagnosis Date  . Allergy   . Anxiety   . Hypertension   . Hyperlipidemia     Past Surgical History  Procedure Laterality Date  . Vaginal hysterectomy    . Cataract extraction, bilateral      Family History  Problem Relation Age of Onset  . Breast cancer  Sister 45  . Breast cancer Daughter 58    Social History   Social History  . Marital Status: Married    Spouse Name: N/A  . Number of Children: N/A  . Years of Education: N/A   Occupational History  . Not on file.   Social History Main Topics  . Smoking status: Never Smoker   . Smokeless tobacco: Not on file  . Alcohol Use: No  . Drug Use: No  . Sexual Activity: Not Currently    Birth Control/ Protection: Abstinence   Other Topics Concern  . Not on file   Social History Narrative    Allergies  Allergen Reactions  . Atorvastatin   . Etodolac   . Ezetimibe      Review of Systems  Constitutional: Negative for fever, chills, weight loss, malaise/fatigue and irritability.  HENT: Negative for ear discharge, ear pain and sore throat.   Eyes: Negative for blurred vision.  Respiratory: Negative for cough, sputum production, shortness of breath and wheezing.   Cardiovascular: Negative for chest pain, palpitations, orthopnea, leg swelling and PND.  Gastrointestinal: Negative for heartburn, nausea, abdominal pain, diarrhea, constipation, blood in stool and melena.  Genitourinary: Negative for dysuria, urgency, frequency and hematuria.  Musculoskeletal: Negative for myalgias, back pain, joint pain and neck pain.  Skin: Negative for rash.  Neurological: Negative for dizziness, tingling, sensory change, focal weakness and headaches.  Endo/Heme/Allergies: Negative for environmental allergies and polydipsia. Does not bruise/bleed easily.  Psychiatric/Behavioral: Negative for depression and suicidal ideas. The patient is not nervous/anxious and does not have insomnia.      Objective  Filed Vitals:  12/23/15 0800  BP: 120/60  Pulse: 70  Height: 5\' 4"  (1.626 m)  Weight: 168 lb (76.204 kg)    Physical Exam  Constitutional: She is well-developed, well-nourished, and in no distress. No distress.  HENT:  Head: Normocephalic and atraumatic.  Right Ear: External ear normal.   Left Ear: External ear normal.  Nose: Nose normal.  Mouth/Throat: Oropharynx is clear and moist.  Eyes: Conjunctivae and EOM are normal. Pupils are equal, round, and reactive to light. Right eye exhibits no discharge. Left eye exhibits no discharge.  Neck: Normal range of motion. Neck supple. No JVD present. No thyromegaly present.  Cardiovascular: Normal rate, regular rhythm, normal heart sounds and intact distal pulses.  Exam reveals no gallop and no friction rub.   No murmur heard. Pulmonary/Chest: Effort normal and breath sounds normal.  Abdominal: Soft. Bowel sounds are normal. She exhibits no mass. There is no tenderness. There is no guarding.  Musculoskeletal: Normal range of motion. She exhibits no edema.  Lymphadenopathy:    She has no cervical adenopathy.  Neurological: She is alert.  Skin: Skin is warm and dry. She is not diaphoretic.  Psychiatric: Mood and affect normal.  Nursing note and vitals reviewed.     Assessment & Plan  Problem List Items Addressed This Visit      Cardiovascular and Mediastinum   BP (high blood pressure) - Primary   Relevant Medications   hydrochlorothiazide (MICROZIDE) 12.5 MG capsule   lisinopril (PRINIVIL,ZESTRIL) 20 MG tablet   metoprolol succinate (TOPROL-XL) 50 MG 24 hr tablet   Other Relevant Orders   Renal Function Panel   Lipid Panel With LDL/HDL Ratio     Other   Familial multiple lipoprotein-type hyperlipidemia   Relevant Medications   hydrochlorothiazide (MICROZIDE) 12.5 MG capsule   lisinopril (PRINIVIL,ZESTRIL) 20 MG tablet   metoprolol succinate (TOPROL-XL) 50 MG 24 hr tablet   Omega 3 1000 MG CAPS   Other Relevant Orders   Lipid Panel With LDL/HDL Ratio   Anxiety   Relevant Medications   clorazepate (TRANXENE) 7.5 MG tablet    Other Visit Diagnoses    Primary osteoarthritis of left knee        Relevant Medications    traMADol (ULTRAM) 50 MG tablet    Other Relevant Orders    Ambulatory referral to Orthopedic  Surgery    Need for pneumococcal vaccination        Relevant Orders    Pneumococcal conjugate vaccine 13-valent (Completed)         Dr. Hayden [facility]  12/23/2015

## 2015-12-24 LAB — RENAL FUNCTION PANEL
Albumin: 4.3 g/dL (ref 3.5–4.7)
BUN / CREAT RATIO: 29 — AB (ref 12–28)
BUN: 32 mg/dL — ABNORMAL HIGH (ref 8–27)
CALCIUM: 10.4 mg/dL — AB (ref 8.7–10.3)
CHLORIDE: 104 mmol/L (ref 96–106)
CO2: 20 mmol/L (ref 18–29)
Creatinine, Ser: 1.12 mg/dL — ABNORMAL HIGH (ref 0.57–1.00)
GFR calc Af Amer: 53 mL/min/{1.73_m2} — ABNORMAL LOW (ref >59)
GFR calc non Af Amer: 46 mL/min/{1.73_m2} — ABNORMAL LOW (ref >59)
Glucose: 103 mg/dL — ABNORMAL HIGH (ref 65–99)
POTASSIUM: 5.2 mmol/L (ref 3.5–5.2)
Phosphorus: 3.3 mg/dL (ref 2.5–4.5)
SODIUM: 142 mmol/L (ref 134–144)

## 2015-12-24 LAB — LIPID PANEL WITH LDL/HDL RATIO
CHOLESTEROL TOTAL: 210 mg/dL — AB (ref 100–199)
HDL: 63 mg/dL (ref >39)
LDL Calculated: 121 mg/dL — ABNORMAL HIGH (ref 0–99)
LDl/HDL Ratio: 1.9 ratio units (ref 0.0–3.2)
Triglycerides: 130 mg/dL (ref 0–149)
VLDL Cholesterol Cal: 26 mg/dL (ref 5–40)

## 2015-12-30 DIAGNOSIS — M1712 Unilateral primary osteoarthritis, left knee: Secondary | ICD-10-CM | POA: Diagnosis not present

## 2015-12-30 DIAGNOSIS — Z961 Presence of intraocular lens: Secondary | ICD-10-CM | POA: Diagnosis not present

## 2015-12-30 DIAGNOSIS — M25562 Pain in left knee: Secondary | ICD-10-CM | POA: Diagnosis not present

## 2016-03-07 ENCOUNTER — Other Ambulatory Visit: Payer: Self-pay

## 2016-04-12 DIAGNOSIS — M1712 Unilateral primary osteoarthritis, left knee: Secondary | ICD-10-CM | POA: Diagnosis not present

## 2016-05-02 ENCOUNTER — Encounter: Payer: Self-pay | Admitting: Family Medicine

## 2016-05-02 ENCOUNTER — Ambulatory Visit (INDEPENDENT_AMBULATORY_CARE_PROVIDER_SITE_OTHER): Payer: Medicare Other | Admitting: Family Medicine

## 2016-05-02 VITALS — BP 100/62 | HR 66 | Temp 97.6°F | Ht 64.0 in | Wt 168.0 lb

## 2016-05-02 DIAGNOSIS — J0101 Acute recurrent maxillary sinusitis: Secondary | ICD-10-CM

## 2016-05-02 DIAGNOSIS — J40 Bronchitis, not specified as acute or chronic: Secondary | ICD-10-CM

## 2016-05-02 MED ORDER — GUAIFENESIN-CODEINE 100-10 MG/5ML PO SYRP: 5.0000 mL | ORAL_SOLUTION | Freq: Three times a day (TID) | ORAL | 0 refills | Status: DC | PRN

## 2016-05-02 MED ORDER — AZITHROMYCIN 250 MG PO TABS: ORAL_TABLET | ORAL | 0 refills | Status: DC

## 2016-05-02 NOTE — Progress Notes (Signed)
Name: Karen Hooper   MRN: [id number]    DOB: 06/09/1932   Date:05/02/2016       Progress Note  Subjective  Chief Complaint  Chief Complaint  Patient presents with  . Sinusitis    cough and cong- yellow/green bloody production- cough is worse at night    Sinusitis  This is a recurrent problem. The current episode started 1 to 4 weeks ago. The problem has been waxing and waning since onset. There has been no fever. Associated symptoms include congestion, coughing, headaches, sinus pressure and sneezing. Pertinent negatives include no chills, diaphoresis, ear pain, hoarse voice, neck pain, shortness of breath, sore throat or swollen glands. (Frontal headache) Past treatments include acetaminophen. The treatment provided no relief.  Cough  This is a new problem. The current episode started 1 to 4 weeks ago. The problem has been waxing and waning. The cough is productive of purulent sputum and productive of blood-tinged sputum (yellow/green). Associated symptoms include headaches, nasal congestion, postnasal drip and wheezing. Pertinent negatives include no chest pain, chills, ear pain, fever, heartburn, myalgias, rash, sore throat, shortness of breath or weight loss. She has tried a beta-agonist inhaler for the symptoms. The treatment provided mild relief. There is no history of asthma, bronchiectasis, bronchitis, COPD, emphysema, environmental allergies or pneumonia.    No problem-specific Assessment & Plan notes found for this encounter.   Past Medical History:  Diagnosis Date  . Allergy   . Anxiety   . Hyperlipidemia   . Hypertension     Past Surgical History:  Procedure Laterality Date  . CATARACT EXTRACTION, BILATERAL    . VAGINAL HYSTERECTOMY      Family History  Problem Relation Age of Onset  . Breast cancer Sister [id number]  . Breast cancer Daughter [id number]    Social History   Social History  . Marital status: Married    Spouse name: N/A  . Number of children: N/A  . Years  of education: N/A   Occupational History  . Not on file.   Social History Main Topics  . Smoking status: Never Smoker  . Smokeless tobacco: Not on file  . Alcohol use No  . Drug use: No  . Sexual activity: Not Currently    Birth control/ protection: Abstinence   Other Topics Concern  . Not on file   Social History Narrative  . No narrative on file    Allergies  Allergen Reactions  . Atorvastatin   . Etodolac   . Ezetimibe      Review of Systems  Constitutional: Negative for chills, diaphoresis, fever, malaise/fatigue and weight loss.  HENT: Positive for congestion, postnasal drip, sinus pressure and sneezing. Negative for ear discharge, ear pain, hoarse voice and sore throat.   Eyes: Negative for blurred vision.  Respiratory: Positive for cough and wheezing. Negative for sputum production and shortness of breath.   Cardiovascular: Negative for chest pain, palpitations and leg swelling.  Gastrointestinal: Negative for abdominal pain, blood in stool, constipation, diarrhea, heartburn, melena and nausea.  Genitourinary: Negative for dysuria, frequency, hematuria and urgency.  Musculoskeletal: Negative for back pain, joint pain, myalgias and neck pain.  Skin: Negative for rash.  Neurological: Positive for headaches. Negative for dizziness, tingling, sensory change and focal weakness.  Endo/Heme/Allergies: Negative for environmental allergies and polydipsia. Does not bruise/bleed easily.  Psychiatric/Behavioral: Negative for depression and suicidal ideas. The patient is not nervous/anxious and does not have insomnia.      Objective  Vitals:   05/02/16 1119  BP: 100/62  Pulse: 66  Temp: 97.6 F (36.4 C)  TempSrc: Oral  SpO2: 99%  Weight: 168 lb (76.2 kg)  Height: 5\' 4"  (1.626 m)    Physical Exam  Constitutional: She is well-developed, well-nourished, and in no distress. No distress.  HENT:  Head: Normocephalic and atraumatic.  Right Ear: External ear normal.   Left Ear: External ear normal.  Nose: Nose normal.  Mouth/Throat: Oropharynx is clear and moist.  Eyes: Conjunctivae and EOM are normal. Pupils are equal, round, and reactive to light. Right eye exhibits no discharge. Left eye exhibits no discharge.  Neck: Normal range of motion. Neck supple. No JVD present. No thyromegaly present.  Cardiovascular: Normal rate, regular rhythm, normal heart sounds and intact distal pulses.  Exam reveals no gallop and no friction rub.   No murmur heard. Pulmonary/Chest: Effort normal and breath sounds normal. No respiratory distress. She has no wheezes. She has no rales. She exhibits no tenderness.  Abdominal: Soft. Bowel sounds are normal. She exhibits no mass. There is no tenderness. There is no guarding.  Musculoskeletal: Normal range of motion. She exhibits deformity. She exhibits no edema or tenderness.  Lymphadenopathy:    She has no cervical adenopathy.  Neurological: She is alert. She has normal reflexes.  Skin: Skin is warm and dry. She is not diaphoretic.  Psychiatric: Mood and affect normal.  Nursing note and vitals reviewed.     Assessment & Plan  Problem List Items Addressed This Visit    None    Visit Diagnoses    Acute recurrent maxillary sinusitis    -  Primary   Relevant Medications   azithromycin (ZITHROMAX) 250 MG tablet   guaiFENesin-codeine (ROBITUSSIN AC) 100-10 MG/5ML syrup   Bronchitis       Relevant Medications   azithromycin (ZITHROMAX) 250 MG tablet   guaiFENesin-codeine (ROBITUSSIN AC) 100-10 MG/5ML syrup        Dr. [facility]  05/02/16

## 2016-06-07 ENCOUNTER — Other Ambulatory Visit: Payer: Self-pay

## 2016-06-23 ENCOUNTER — Encounter: Payer: Self-pay | Admitting: Family Medicine

## 2016-06-23 ENCOUNTER — Ambulatory Visit (INDEPENDENT_AMBULATORY_CARE_PROVIDER_SITE_OTHER): Payer: Medicare Other | Admitting: Family Medicine

## 2016-06-23 VITALS — BP 120/70 | HR 68 | Ht 64.0 in | Wt 164.0 lb

## 2016-06-23 DIAGNOSIS — E784 Other hyperlipidemia: Secondary | ICD-10-CM

## 2016-06-23 DIAGNOSIS — N183 Chronic kidney disease, stage 3 unspecified: Secondary | ICD-10-CM

## 2016-06-23 DIAGNOSIS — F419 Anxiety disorder, unspecified: Secondary | ICD-10-CM | POA: Diagnosis not present

## 2016-06-23 DIAGNOSIS — Z23 Encounter for immunization: Secondary | ICD-10-CM | POA: Diagnosis not present

## 2016-06-23 DIAGNOSIS — I10 Essential (primary) hypertension: Secondary | ICD-10-CM | POA: Diagnosis not present

## 2016-06-23 DIAGNOSIS — J302 Other seasonal allergic rhinitis: Secondary | ICD-10-CM

## 2016-06-23 DIAGNOSIS — E7849 Other hyperlipidemia: Secondary | ICD-10-CM

## 2016-06-23 MED ORDER — METOPROLOL SUCCINATE ER 50 MG PO TB24: 50.0000 mg | ORAL_TABLET | Freq: Every day | ORAL | 5 refills | Status: DC

## 2016-06-23 MED ORDER — CLORAZEPATE DIPOTASSIUM 7.5 MG PO TABS: 7.5000 mg | ORAL_TABLET | Freq: Every day | ORAL | 5 refills | Status: DC

## 2016-06-23 MED ORDER — OMEGA 3 1000 MG PO CAPS
2.0000 | ORAL_CAPSULE | Freq: Two times a day (BID) | ORAL | 11 refills | Status: DC
Start: 2016-06-23 — End: 2016-12-22

## 2016-06-23 MED ORDER — LISINOPRIL 20 MG PO TABS: ORAL_TABLET | ORAL | 5 refills | Status: DC

## 2016-06-23 MED ORDER — LORATADINE 10 MG PO TABS: 10.0000 mg | ORAL_TABLET | Freq: Every day | ORAL | 5 refills | Status: DC

## 2016-06-23 MED ORDER — HYDROCHLOROTHIAZIDE 12.5 MG PO CAPS: 12.5000 mg | ORAL_CAPSULE | Freq: Every day | ORAL | 5 refills | Status: DC

## 2016-06-23 NOTE — Progress Notes (Signed)
Name: Karen Hooper   MRN: [id number]    DOB: 02/14/1932   Date:06/23/2016       Progress Note  Subjective  Chief Complaint  Chief Complaint  Patient presents with  . Hypertension  . Allergic Rhinitis   . Anxiety    Hypertension  This is a chronic problem. The current episode started more than 1 year ago. The problem has been gradually improving since onset. The problem is controlled. Associated symptoms include anxiety. Pertinent negatives include no blurred vision, chest pain, headaches, malaise/fatigue, neck pain, orthopnea, palpitations, peripheral edema, PND, shortness of breath or sweats. There are no associated agents to hypertension. There are no known risk factors for coronary artery disease. Past treatments include ACE inhibitors, diuretics and beta blockers. The current treatment provides mild improvement. There are no compliance problems.  There is no history of angina, kidney disease, CAD/MI, CVA, heart failure, left ventricular hypertrophy, PVD, renovascular disease or retinopathy. There is no history of chronic renal disease or a hypertension causing med.  Anxiety  Presents for follow-up visit. Symptoms include excessive worry, nervous/anxious behavior and panic. Patient reports no chest pain, compulsions, confusion, decreased concentration, depressed mood, dizziness, dry mouth, feeling of choking, hyperventilation, impotence, insomnia, irritability, malaise, muscle tension, nausea, obsessions, palpitations, restlessness, shortness of breath or suicidal ideas. Symptoms occur most days. The severity of symptoms is mild. The quality of sleep is good. Nighttime awakenings: occasional.      No problem-specific Assessment & Plan notes found for this encounter.   Past Medical History:  Diagnosis Date  . Allergy   . Anxiety   . Hyperlipidemia   . Hypertension     Past Surgical History:  Procedure Laterality Date  . CATARACT EXTRACTION, BILATERAL    . VAGINAL  HYSTERECTOMY      Family History  Problem Relation Age of Onset  . Breast cancer Sister [id number]  . Breast cancer Daughter [id number]    Social History   Social History  . Marital status: Married    Spouse name: N/A  . Number of children: N/A  . Years of education: N/A   Occupational History  . Not on file.   Social History Main Topics  . Smoking status: Never Smoker  . Smokeless tobacco: Not on file  . Alcohol use No  . Drug use: No  . Sexual activity: Not Currently    Birth control/ protection: Abstinence   Other Topics Concern  . Not on file   Social History Narrative  . No narrative on file    Allergies  Allergen Reactions  . Atorvastatin   . Etodolac   . Ezetimibe      Review of Systems  Constitutional: Negative for chills, fever, irritability, malaise/fatigue and weight loss.  HENT: Negative for ear discharge, ear pain and sore throat.   Eyes: Negative for blurred vision.  Respiratory: Negative for cough, sputum production, shortness of breath and wheezing.   Cardiovascular: Negative for chest pain, palpitations, orthopnea, claudication, leg swelling and PND.  Gastrointestinal: Negative for abdominal pain, blood in stool, constipation, diarrhea, heartburn, melena and nausea.  Genitourinary: Negative for dysuria, frequency, hematuria, impotence and urgency.  Musculoskeletal: Negative for back pain, joint pain, myalgias and neck pain.  Skin: Negative for rash.  Neurological: Negative for dizziness, tingling, sensory change, focal weakness and headaches.  Endo/Heme/Allergies: Negative for environmental allergies and polydipsia. Does not bruise/bleed easily.  Psychiatric/Behavioral: Negative for confusion, decreased concentration, depression and suicidal ideas. The patient is nervous/anxious. The patient does not have  insomnia.      Objective  Vitals:   06/23/16 0805  BP: 120/70  Pulse: 68  Weight: 164 lb (74.4 kg)  Height: 5\' 4"  (1.626 m)    Physical Exam   Constitutional: She is well-developed, well-nourished, and in no distress. No distress.  HENT:  Head: Normocephalic and atraumatic.  Right Ear: External ear normal.  Left Ear: External ear normal.  Nose: Nose normal.  Mouth/Throat: Oropharynx is clear and moist.  Eyes: Conjunctivae and EOM are normal. Pupils are equal, round, and reactive to light. Right eye exhibits no discharge. Left eye exhibits no discharge.  Neck: Normal range of motion. Neck supple. No JVD present. No thyromegaly present.  Cardiovascular: Normal rate, regular rhythm, normal heart sounds and intact distal pulses.  Exam reveals no gallop and no friction rub.   No murmur heard. Pulmonary/Chest: Effort normal and breath sounds normal. She has no wheezes. She has no rales.  Abdominal: Soft. Bowel sounds are normal. She exhibits no mass. There is no tenderness. There is no guarding.  Musculoskeletal: Normal range of motion. She exhibits no edema.  Lymphadenopathy:    She has no cervical adenopathy.  Neurological: She is alert. She has normal reflexes.  Skin: Skin is warm and dry. No rash noted. She is not diaphoretic.  Psychiatric: Mood and affect normal.  Nursing note and vitals reviewed.     Assessment & Plan  Problem List Items Addressed This Visit      Cardiovascular and Mediastinum   Essential hypertension - Primary   Relevant Medications   hydrochlorothiazide (MICROZIDE) 12.5 MG capsule   lisinopril (PRINIVIL,ZESTRIL) 20 MG tablet   metoprolol succinate (TOPROL-XL) 50 MG 24 hr tablet   Other Relevant Orders   Renal Function Panel     Respiratory   Allergic rhinitis   Relevant Medications   loratadine (CLARITIN) 10 MG tablet     Genitourinary   CKD (chronic kidney disease) stage 3, GFR 30-59 ml/min     Other   Familial multiple lipoprotein-type hyperlipidemia   Relevant Medications   hydrochlorothiazide (MICROZIDE) 12.5 MG capsule   lisinopril (PRINIVIL,ZESTRIL) 20 MG tablet   metoprolol  succinate (TOPROL-XL) 50 MG 24 hr tablet   Omega 3 1000 MG CAPS   Other Relevant Orders   Lipid Profile   Anxiety   Relevant Medications   clorazepate (TRANXENE) 7.5 MG tablet    Other Visit Diagnoses    Immunization due       Relevant Orders   Flu Vaccine QUAD 36+ mos PF IM (Fluarix & Fluzone Quad PF) (Completed)        Dr. Hayden Rasmusseneanna Eliette Drumwright [facility]  06/23/16

## 2016-06-24 ENCOUNTER — Other Ambulatory Visit: Payer: Self-pay

## 2016-06-24 LAB — RENAL FUNCTION PANEL
ALBUMIN: 4.3 g/dL (ref 3.5–4.7)
BUN/Creatinine Ratio: 30 — ABNORMAL HIGH (ref 12–28)
BUN: 33 mg/dL — ABNORMAL HIGH (ref 8–27)
CALCIUM: 10.4 mg/dL — AB (ref 8.7–10.3)
CO2: 23 mmol/L (ref 18–29)
CREATININE: 1.09 mg/dL — AB (ref 0.57–1.00)
Chloride: 103 mmol/L (ref 96–106)
GFR calc Af Amer: 55 mL/min/{1.73_m2} — ABNORMAL LOW (ref >59)
GFR, EST NON AFRICAN AMERICAN: 47 mL/min/{1.73_m2} — AB (ref >59)
GLUCOSE: 106 mg/dL — AB (ref 65–99)
PHOSPHORUS: 3.3 mg/dL (ref 2.5–4.5)
POTASSIUM: 4.9 mmol/L (ref 3.5–5.2)
SODIUM: 141 mmol/L (ref 134–144)

## 2016-06-24 LAB — LIPID PANEL
CHOLESTEROL TOTAL: 220 mg/dL — AB (ref 100–199)
Chol/HDL Ratio: 3.3 ratio units (ref 0.0–4.4)
HDL: 66 mg/dL (ref >39)
LDL Calculated: 130 mg/dL — ABNORMAL HIGH (ref 0–99)
TRIGLYCERIDES: 118 mg/dL (ref 0–149)
VLDL Cholesterol Cal: 24 mg/dL (ref 5–40)

## 2016-06-27 ENCOUNTER — Other Ambulatory Visit: Payer: Self-pay | Admitting: Family Medicine

## 2016-06-27 DIAGNOSIS — Z1231 Encounter for screening mammogram for malignant neoplasm of breast: Secondary | ICD-10-CM

## 2016-08-08 ENCOUNTER — Ambulatory Visit
Admission: RE | Admit: 2016-08-08 | Discharge: 2016-08-08 | Disposition: A | Discharge status: Home / Self Care | Payer: Medicare Other | Source: Ambulatory Visit | Attending: Family Medicine | Admitting: Family Medicine

## 2016-08-08 DIAGNOSIS — Z1231 Encounter for screening mammogram for malignant neoplasm of breast: Secondary | ICD-10-CM | POA: Diagnosis not present

## 2016-08-11 ENCOUNTER — Other Ambulatory Visit: Payer: Self-pay

## 2016-12-07 ENCOUNTER — Other Ambulatory Visit: Payer: Self-pay | Admitting: Family Medicine

## 2016-12-07 DIAGNOSIS — I10 Essential (primary) hypertension: Secondary | ICD-10-CM

## 2016-12-22 ENCOUNTER — Ambulatory Visit (INDEPENDENT_AMBULATORY_CARE_PROVIDER_SITE_OTHER): Payer: Medicare Other | Admitting: Family Medicine

## 2016-12-22 ENCOUNTER — Encounter: Payer: Self-pay | Admitting: Family Medicine

## 2016-12-22 VITALS — BP 120/60 | HR 60 | Ht 64.0 in | Wt 165.0 lb

## 2016-12-22 DIAGNOSIS — I10 Essential (primary) hypertension: Secondary | ICD-10-CM

## 2016-12-22 DIAGNOSIS — J301 Allergic rhinitis due to pollen: Secondary | ICD-10-CM

## 2016-12-22 DIAGNOSIS — E784 Other hyperlipidemia: Secondary | ICD-10-CM | POA: Diagnosis not present

## 2016-12-22 DIAGNOSIS — E7849 Other hyperlipidemia: Secondary | ICD-10-CM

## 2016-12-22 DIAGNOSIS — H6121 Impacted cerumen, right ear: Secondary | ICD-10-CM | POA: Diagnosis not present

## 2016-12-22 DIAGNOSIS — F419 Anxiety disorder, unspecified: Secondary | ICD-10-CM | POA: Diagnosis not present

## 2016-12-22 MED ORDER — HYDROCHLOROTHIAZIDE 12.5 MG PO CAPS: 12.5000 mg | ORAL_CAPSULE | Freq: Every day | ORAL | 1 refills | Status: DC

## 2016-12-22 MED ORDER — OMEGA 3 1000 MG PO CAPS: 2.0000 | ORAL_CAPSULE | Freq: Two times a day (BID) | ORAL | 11 refills | Status: DC

## 2016-12-22 MED ORDER — METOPROLOL SUCCINATE ER 50 MG PO TB24: 50.0000 mg | ORAL_TABLET | Freq: Every day | ORAL | 1 refills | Status: DC

## 2016-12-22 MED ORDER — LISINOPRIL 20 MG PO TABS: ORAL_TABLET | ORAL | 1 refills | Status: DC

## 2016-12-22 MED ORDER — LORATADINE 10 MG PO TABS: 10.0000 mg | ORAL_TABLET | Freq: Every day | ORAL | 11 refills | Status: DC

## 2016-12-22 NOTE — Progress Notes (Signed)
Name: Karen Hooper   MRN: [id number]    DOB: 1931-08-20   Date:12/22/2016       Progress Note  Subjective  Chief Complaint  Chief Complaint  Patient presents with  . Hypertension  . Anxiety    Hypertension  This is a chronic problem. The current episode started more than 1 year ago. The problem is unchanged. The problem is controlled. Associated symptoms include anxiety. Pertinent negatives include no blurred vision, chest pain, headaches, malaise/fatigue, neck pain, orthopnea, palpitations, peripheral edema, PND, shortness of breath or sweats. There are no associated agents to hypertension. There are no known risk factors for coronary artery disease. Past treatments include ACE inhibitors, beta blockers and diuretics. The current treatment provides moderate improvement. There are no compliance problems.  There is no history of angina, kidney disease, CAD/MI, CVA, heart failure, left ventricular hypertrophy, PVD or retinopathy. There is no history of chronic renal disease, a hypertension causing med or renovascular disease.  Anxiety  Presents for follow-up visit. Symptoms include excessive worry and nervous/anxious behavior. Patient reports no chest pain, compulsions, confusion, decreased concentration, depressed mood, dizziness, dry mouth, feeling of choking, hyperventilation, impotence, insomnia, irritability, muscle tension, nausea, palpitations, panic, shortness of breath or suicidal ideas. Symptoms occur occasionally. The severity of symptoms is mild. The quality of sleep is good.    Hyperlipidemia  This is a chronic problem. The problem is controlled. Recent lipid tests were reviewed and are normal. She has no history of chronic renal disease. Factors aggravating her hyperlipidemia include thiazides. Pertinent negatives include no chest pain, focal weakness, myalgias or shortness of breath. Treatments tried: omega 3. The current treatment provides mild improvement of lipids. There are no  compliance problems.     No problem-specific Assessment & Plan notes found for this encounter.   Past Medical History:  Diagnosis Date  . Allergy   . Anxiety   . Hyperlipidemia   . Hypertension     Past Surgical History:  Procedure Laterality Date  . CATARACT EXTRACTION, BILATERAL    . VAGINAL HYSTERECTOMY      Family History  Problem Relation Age of Onset  . Breast cancer Sister 40  . Breast cancer Daughter 59    Social History   Social History  . Marital status: Married    Spouse name: N/A  . Number of children: N/A  . Years of education: N/A   Occupational History  . Not on file.   Social History Main Topics  . Smoking status: Never Smoker  . Smokeless tobacco: Not on file  . Alcohol use No  . Drug use: No  . Sexual activity: Not Currently    Birth control/ protection: Abstinence   Other Topics Concern  . Not on file   Social History Narrative  . No narrative on file    Allergies  Allergen Reactions  . Atorvastatin   . Etodolac   . Ezetimibe     Outpatient Medications Prior to Visit  Medication Sig Dispense Refill  . clorazepate (TRANXENE) 7.5 MG tablet Take 1 tablet (7.5 mg total) by mouth daily. 30 tablet 5  . hydrochlorothiazide (MICROZIDE) 12.5 MG capsule Take 1 capsule (12.5 mg total) by mouth daily. 30 capsule 5  . lisinopril (PRINIVIL,ZESTRIL) 20 MG tablet TAKE (1) TABLET BY MOUTH EVERY DAY 30 tablet 0  . loratadine (CLARITIN) 10 MG tablet Take 1 tablet (10 mg total) by mouth daily. (Patient taking differently: Take 10 mg by mouth daily. otc) 30 tablet 5  .  metoprolol succinate (TOPROL-XL) 50 MG 24 hr tablet Take 1 tablet (50 mg total) by mouth daily. 30 tablet 5  . Omega 3 1000 MG CAPS Take 2 capsules (2,000 mg total) by mouth 2 (two) times daily. 90 each 11   No facility-administered medications prior to visit.     Review of Systems  Constitutional: Negative for chills, fever, irritability, malaise/fatigue and weight loss.  HENT:  Negative for ear discharge, ear pain and sore throat.   Eyes: Negative for blurred vision.  Respiratory: Negative for cough, sputum production, shortness of breath and wheezing.   Cardiovascular: Negative for chest pain, palpitations, orthopnea, leg swelling and PND.  Gastrointestinal: Negative for abdominal pain, blood in stool, constipation, diarrhea, heartburn, melena and nausea.  Genitourinary: Negative for dysuria, frequency, hematuria, impotence and urgency.  Musculoskeletal: Negative for back pain, joint pain, myalgias and neck pain.  Skin: Negative for rash.  Neurological: Negative for dizziness, tingling, sensory change, focal weakness and headaches.  Endo/Heme/Allergies: Negative for environmental allergies and polydipsia. Does not bruise/bleed easily.  Psychiatric/Behavioral: Negative for confusion, decreased concentration, depression and suicidal ideas. The patient is nervous/anxious. The patient does not have insomnia.      Objective  Vitals:   12/22/16 0804  BP: 120/60  Pulse: 60  Weight: 165 lb (74.8 kg)  Height: 5\' 4"  (1.626 m)    Physical Exam  Constitutional: She is well-developed, well-nourished, and in no distress. No distress.  HENT:  Head: Normocephalic and atraumatic.  Right Ear: External ear normal.  Left Ear: External ear normal.  Nose: Nose normal.  Mouth/Throat: Oropharynx is clear and moist.  Eyes: Conjunctivae and EOM are normal. Pupils are equal, round, and reactive to light. Right eye exhibits no discharge. Left eye exhibits no discharge.  Neck: Normal range of motion. Neck supple. No JVD present. No thyromegaly present.  Cardiovascular: Normal rate, regular rhythm, normal heart sounds and intact distal pulses.  Exam reveals no gallop and no friction rub.   No murmur heard. Pulmonary/Chest: Effort normal and breath sounds normal. She has no wheezes. She has no rales.  Abdominal: Soft. Bowel sounds are normal. She exhibits no mass. There is no  tenderness. There is no guarding.  Musculoskeletal: Normal range of motion. She exhibits no edema.  Lymphadenopathy:    She has no cervical adenopathy.  Neurological: She is alert. She has normal reflexes.  Skin: Skin is warm and dry. She is not diaphoretic.  Psychiatric: Mood and affect normal.  Nursing note and vitals reviewed.     Assessment & Plan  Problem List Items Addressed This Visit      Cardiovascular and Mediastinum   Essential hypertension - Primary   Relevant Medications   hydrochlorothiazide (MICROZIDE) 12.5 MG capsule   lisinopril (PRINIVIL,ZESTRIL) 20 MG tablet   metoprolol succinate (TOPROL-XL) 50 MG 24 hr tablet     Respiratory   Allergic rhinitis   Relevant Orders   Renal function panel     Other   Familial multiple lipoprotein-type hyperlipidemia   Relevant Medications   Omega 3 1000 MG CAPS   hydrochlorothiazide (MICROZIDE) 12.5 MG capsule   lisinopril (PRINIVIL,ZESTRIL) 20 MG tablet   metoprolol succinate (TOPROL-XL) 50 MG 24 hr tablet   Other Relevant Orders   Lipid Profile   Anxiety    Other Visit Diagnoses    Impacted cerumen of right ear          Meds ordered this encounter  Medications  . Omega 3 1000 MG CAPS  Sig: Take 2 capsules (2,000 mg total) by mouth 2 (two) times daily.    Dispense:  90 each    Refill:  11  . hydrochlorothiazide (MICROZIDE) 12.5 MG capsule    Sig: Take 1 capsule (12.5 mg total) by mouth daily.    Dispense:  90 capsule    Refill:  1  . lisinopril (PRINIVIL,ZESTRIL) 20 MG tablet    Sig: TAKE (1) TABLET BY MOUTH EVERY DAY    Dispense:  90 tablet    Refill:  1  . metoprolol succinate (TOPROL-XL) 50 MG 24 hr tablet    Sig: Take 1 tablet (50 mg total) by mouth daily.    Dispense:  90 tablet    Refill:  1  . loratadine (CLARITIN) 10 MG tablet    Sig: Take 1 tablet (10 mg total) by mouth daily. otc    Dispense:  30 tablet    Refill:  11      Dr. Hayden [facility]  12/22/16

## 2016-12-23 LAB — LIPID PANEL
CHOLESTEROL TOTAL: 193 mg/dL (ref 100–199)
Chol/HDL Ratio: 2.9 ratio (ref 0.0–4.4)
HDL: 67 mg/dL (ref >39)
LDL Calculated: 103 mg/dL — ABNORMAL HIGH (ref 0–99)
Triglycerides: 114 mg/dL (ref 0–149)
VLDL Cholesterol Cal: 23 mg/dL (ref 5–40)

## 2016-12-23 LAB — RENAL FUNCTION PANEL
ALBUMIN: 4.2 g/dL (ref 3.5–4.7)
BUN/Creatinine Ratio: 28 (ref 12–28)
BUN: 32 mg/dL — ABNORMAL HIGH (ref 8–27)
CALCIUM: 10.1 mg/dL (ref 8.7–10.3)
CO2: 21 mmol/L (ref 20–29)
CREATININE: 1.13 mg/dL — AB (ref 0.57–1.00)
Chloride: 106 mmol/L (ref 96–106)
GFR calc Af Amer: 52 mL/min/{1.73_m2} — ABNORMAL LOW (ref >59)
GFR calc non Af Amer: 45 mL/min/{1.73_m2} — ABNORMAL LOW (ref >59)
Glucose: 95 mg/dL (ref 65–99)
PHOSPHORUS: 3.3 mg/dL (ref 2.5–4.5)
Potassium: 4.6 mmol/L (ref 3.5–5.2)
SODIUM: 143 mmol/L (ref 134–144)

## 2016-12-30 DIAGNOSIS — Z961 Presence of intraocular lens: Secondary | ICD-10-CM | POA: Diagnosis not present

## 2017-02-23 DIAGNOSIS — M1712 Unilateral primary osteoarthritis, left knee: Secondary | ICD-10-CM | POA: Diagnosis not present

## 2017-04-10 ENCOUNTER — Ambulatory Visit (INDEPENDENT_AMBULATORY_CARE_PROVIDER_SITE_OTHER): Payer: Medicare Other

## 2017-04-10 DIAGNOSIS — Z23 Encounter for immunization: Secondary | ICD-10-CM | POA: Diagnosis not present

## 2017-06-27 ENCOUNTER — Encounter: Payer: Self-pay | Admitting: Family Medicine

## 2017-06-27 ENCOUNTER — Ambulatory Visit: Payer: Medicare Other | Admitting: Family Medicine

## 2017-06-27 VITALS — BP 110/62 | HR 60 | Ht 64.0 in | Wt 158.0 lb

## 2017-06-27 DIAGNOSIS — M1712 Unilateral primary osteoarthritis, left knee: Secondary | ICD-10-CM | POA: Diagnosis not present

## 2017-06-27 DIAGNOSIS — E7849 Other hyperlipidemia: Secondary | ICD-10-CM | POA: Diagnosis not present

## 2017-06-27 DIAGNOSIS — I10 Essential (primary) hypertension: Secondary | ICD-10-CM | POA: Diagnosis not present

## 2017-06-27 MED ORDER — OMEGA 3 1000 MG PO CAPS: 2.0000 | ORAL_CAPSULE | Freq: Two times a day (BID) | ORAL | 11 refills | Status: DC

## 2017-06-27 MED ORDER — LORATADINE 10 MG PO TABS: 10.0000 mg | ORAL_TABLET | Freq: Every day | ORAL | 11 refills | Status: DC

## 2017-06-27 MED ORDER — LISINOPRIL 20 MG PO TABS: ORAL_TABLET | ORAL | 1 refills | Status: DC

## 2017-06-27 MED ORDER — HYDROCHLOROTHIAZIDE 12.5 MG PO CAPS: 12.5000 mg | ORAL_CAPSULE | Freq: Every day | ORAL | 1 refills | Status: DC

## 2017-06-27 MED ORDER — TRAMADOL HCL 50 MG PO TABS
50.0000 mg | ORAL_TABLET | Freq: Three times a day (TID) | ORAL | 0 refills | Status: DC | PRN
Start: 2017-06-27 — End: 2017-09-07

## 2017-06-27 MED ORDER — METOPROLOL SUCCINATE ER 50 MG PO TB24: 50.0000 mg | ORAL_TABLET | Freq: Every day | ORAL | 1 refills | Status: DC

## 2017-06-27 NOTE — Progress Notes (Signed)
Name: Karen Hooper   MRN: [id number]    DOB: 03/27/1932   Date:06/27/2017       Progress Note  Subjective  Chief Complaint  Chief Complaint  Patient presents with  . Hypertension  . Allergic Rhinitis     Hypertension  This is a chronic problem. The current episode started more than 1 year ago. The problem has been gradually improving since onset. The problem is controlled. Pertinent negatives include no anxiety, blurred vision, chest pain, headaches, malaise/fatigue, neck pain, orthopnea, palpitations, peripheral edema, PND, shortness of breath or sweats. There are no associated agents to hypertension. There are no known risk factors for coronary artery disease. Past treatments include ACE inhibitors, beta blockers and diuretics. The current treatment provides no improvement. There are no compliance problems.  There is no history of angina, kidney disease, CAD/MI, CVA, heart failure, left ventricular hypertrophy, PVD or retinopathy. There is no history of chronic renal disease, a hypertension causing med or renovascular disease.  Knee Pain   There was no injury mechanism. The pain is present in the left knee. The quality of the pain is described as aching. The pain is at a severity of 7/10. The pain is moderate. The pain has been worsening since onset. Associated symptoms include an inability to bear weight and a loss of motion. Pertinent negatives include no loss of sensation, muscle weakness, numbness or tingling. The symptoms are aggravated by movement and weight bearing. She has tried acetaminophen for the symptoms. The treatment provided no relief.    No problem-specific Assessment & Plan notes found for this encounter.   Past Medical History:  Diagnosis Date  . Allergy   . Anxiety   . Hyperlipidemia   . Hypertension     Past Surgical History:  Procedure Laterality Date  . CATARACT EXTRACTION, BILATERAL    . VAGINAL HYSTERECTOMY      Family History  Problem Relation Age  of Onset  . Breast cancer Sister [id number]  . Breast cancer Daughter [id number]    Social History   Socioeconomic History  . Marital status: Married    Spouse name: Not on file  . Number of children: Not on file  . Years of education: Not on file  . Highest education level: Not on file  Social Needs  . Financial resource strain: Not on file  . Food insecurity - worry: Not on file  . Food insecurity - inability: Not on file  . Transportation needs - medical: Not on file  . Transportation needs - non-medical: Not on file  Occupational History  . Not on file  Tobacco Use  . Smoking status: Never Smoker  . Smokeless tobacco: Never Used  Substance and Sexual Activity  . Alcohol use: No    Alcohol/week: 0.0 oz  . Drug use: No  . Sexual activity: Not Currently    Birth control/protection: Abstinence  Other Topics Concern  . Not on file  Social History Narrative  . Not on file    Allergies  Allergen Reactions  . Atorvastatin   . Etodolac   . Ezetimibe     Outpatient Medications Prior to Visit  Medication Sig Dispense Refill  . hydrochlorothiazide (MICROZIDE) 12.5 MG capsule Take 1 capsule (12.5 mg total) by mouth daily. 90 capsule 1  . lisinopril (PRINIVIL,ZESTRIL) 20 MG tablet TAKE (1) TABLET BY MOUTH EVERY DAY 90 tablet 1  . loratadine (CLARITIN) 10 MG tablet Take 1 tablet (10 mg total) by mouth daily. otc 30 tablet 11  .  metoprolol succinate (TOPROL-XL) 50 MG 24 hr tablet Take 1 tablet (50 mg total) by mouth daily. 90 tablet 1  . Omega 3 1000 MG CAPS Take 2 capsules (2,000 mg total) by mouth 2 (two) times daily. 90 each 11   No facility-administered medications prior to visit.     Review of Systems  Constitutional: Negative for chills, fever, malaise/fatigue and weight loss.  HENT: Negative for ear discharge, ear pain and sore throat.   Eyes: Negative for blurred vision.  Respiratory: Negative for cough, sputum production, shortness of breath and wheezing.   Cardiovascular:  Negative for chest pain, palpitations, orthopnea, leg swelling and PND.  Gastrointestinal: Negative for abdominal pain, blood in stool, constipation, diarrhea, heartburn, melena and nausea.  Genitourinary: Negative for dysuria, frequency, hematuria and urgency.  Musculoskeletal: Negative for back pain, joint pain, myalgias and neck pain.  Skin: Negative for rash.  Neurological: Negative for dizziness, tingling, sensory change, focal weakness, numbness and headaches.  Endo/Heme/Allergies: Negative for environmental allergies and polydipsia. Does not bruise/bleed easily.  Psychiatric/Behavioral: Negative for depression and suicidal ideas. The patient is not nervous/anxious and does not have insomnia.      Objective  Vitals:   06/27/17 0820  BP: 110/62  Pulse: 60  Weight: 158 lb (71.7 kg)  Height: 5\' 4"  (1.626 m)    Physical Exam  Constitutional: She is well-developed, well-nourished, and in no distress. No distress.  HENT:  Head: Normocephalic and atraumatic.  Right Ear: External ear normal.  Left Ear: External ear normal.  Nose: Nose normal.  Mouth/Throat: Oropharynx is clear and moist.  Eyes: Conjunctivae and EOM are normal. Pupils are equal, round, and reactive to light. Right eye exhibits no discharge. Left eye exhibits no discharge.  Neck: Normal range of motion. Neck supple. No JVD present. No thyromegaly present.  Cardiovascular: Normal rate, regular rhythm, normal heart sounds and intact distal pulses. Exam reveals no gallop and no friction rub.  No murmur heard. Pulmonary/Chest: Effort normal and breath sounds normal. She has no wheezes. She has no rales.  Abdominal: Soft. Bowel sounds are normal. She exhibits no mass. There is no tenderness. There is no guarding.  Musculoskeletal: Normal range of motion. She exhibits no edema.       Left knee: Tenderness found. Medial joint line and lateral joint line tenderness noted. No MCL, no LCL and no patellar tendon tenderness  noted.  Lymphadenopathy:    She has no cervical adenopathy.  Neurological: She is alert. She has normal reflexes.  Skin: Skin is warm and dry. She is not diaphoretic.  Psychiatric: Mood and affect normal.  Nursing note and vitals reviewed.     Assessment & Plan  Problem List Items Addressed This Visit      Cardiovascular and Mediastinum   Essential hypertension - Primary   Relevant Medications   lisinopril (PRINIVIL,ZESTRIL) 20 MG tablet   hydrochlorothiazide (MICROZIDE) 12.5 MG capsule   metoprolol succinate (TOPROL-XL) 50 MG 24 hr tablet   traMADol (ULTRAM) 50 MG tablet     Other   Familial multiple lipoprotein-type hyperlipidemia   Relevant Medications   lisinopril (PRINIVIL,ZESTRIL) 20 MG tablet   hydrochlorothiazide (MICROZIDE) 12.5 MG capsule   metoprolol succinate (TOPROL-XL) 50 MG 24 hr tablet   Omega 3 1000 MG CAPS    Other Visit Diagnoses    Primary osteoarthritis of left knee       Relevant Medications   traMADol (ULTRAM) 50 MG tablet      Meds ordered this encounter  Medications  . DISCONTD: lisinopril (PRINIVIL,ZESTRIL) 20 MG tablet    Sig: TAKE (1) TABLET BY MOUTH EVERY DAY    Dispense:  90 tablet    Refill:  1  . lisinopril (PRINIVIL,ZESTRIL) 20 MG tablet    Sig: TAKE (1) TABLET BY MOUTH EVERY DAY    Dispense:  90 tablet    Refill:  1  . hydrochlorothiazide (MICROZIDE) 12.5 MG capsule    Sig: Take 1 capsule (12.5 mg total) by mouth daily.    Dispense:  90 capsule    Refill:  1  . metoprolol succinate (TOPROL-XL) 50 MG 24 hr tablet    Sig: Take 1 tablet (50 mg total) by mouth daily.    Dispense:  90 tablet    Refill:  1  . loratadine (CLARITIN) 10 MG tablet    Sig: Take 1 tablet (10 mg total) by mouth daily. otc    Dispense:  30 tablet    Refill:  11  . Omega 3 1000 MG CAPS    Sig: Take 2 capsules (2,000 mg total) by mouth 2 (two) times daily.    Dispense:  90 each    Refill:  11  . traMADol (ULTRAM) 50 MG tablet    Sig: Take 1 tablet (50  mg total) by mouth every 8 (eight) hours as needed.    Dispense:  30 tablet    Refill:  0      Dr. Hayden Rasmusseneanna Jones [facility]  06/27/17

## 2017-06-28 ENCOUNTER — Other Ambulatory Visit: Payer: Self-pay

## 2017-06-30 ENCOUNTER — Other Ambulatory Visit: Payer: Self-pay | Admitting: Family Medicine

## 2017-06-30 DIAGNOSIS — Z1231 Encounter for screening mammogram for malignant neoplasm of breast: Secondary | ICD-10-CM

## 2017-08-09 ENCOUNTER — Ambulatory Visit
Admission: RE | Admit: 2017-08-09 | Discharge: 2017-08-09 | Disposition: A | Discharge status: Home / Self Care | Payer: Medicare Other | Source: Ambulatory Visit | Attending: Family Medicine | Admitting: Family Medicine

## 2017-08-09 DIAGNOSIS — Z1231 Encounter for screening mammogram for malignant neoplasm of breast: Secondary | ICD-10-CM | POA: Diagnosis not present

## 2017-08-10 ENCOUNTER — Other Ambulatory Visit: Payer: Self-pay

## 2017-08-29 DIAGNOSIS — M2392 Unspecified internal derangement of left knee: Secondary | ICD-10-CM | POA: Diagnosis not present

## 2017-08-29 DIAGNOSIS — M1712 Unilateral primary osteoarthritis, left knee: Secondary | ICD-10-CM | POA: Diagnosis not present

## 2017-09-07 ENCOUNTER — Encounter: Payer: Self-pay | Admitting: Family Medicine

## 2017-09-07 ENCOUNTER — Ambulatory Visit (INDEPENDENT_AMBULATORY_CARE_PROVIDER_SITE_OTHER): Payer: Medicare Other | Admitting: Family Medicine

## 2017-09-07 VITALS — BP 110/62 | HR 64 | Ht 64.0 in | Wt 151.0 lb

## 2017-09-07 DIAGNOSIS — K121 Other forms of stomatitis: Secondary | ICD-10-CM | POA: Diagnosis not present

## 2017-09-07 DIAGNOSIS — J40 Bronchitis, not specified as acute or chronic: Secondary | ICD-10-CM | POA: Diagnosis not present

## 2017-09-07 DIAGNOSIS — J0100 Acute maxillary sinusitis, unspecified: Secondary | ICD-10-CM | POA: Diagnosis not present

## 2017-09-07 DIAGNOSIS — L570 Actinic keratosis: Secondary | ICD-10-CM | POA: Diagnosis not present

## 2017-09-07 MED ORDER — ACYCLOVIR 800 MG PO TABS: 800.0000 mg | ORAL_TABLET | Freq: Two times a day (BID) | ORAL | 0 refills | Status: DC

## 2017-09-07 MED ORDER — GUAIFENESIN-CODEINE 100-10 MG/5ML PO SYRP: 5.0000 mL | ORAL_SOLUTION | Freq: Three times a day (TID) | ORAL | 0 refills | Status: DC | PRN

## 2017-09-07 MED ORDER — AMOXICILLIN 500 MG PO CAPS: 500.0000 mg | ORAL_CAPSULE | Freq: Three times a day (TID) | ORAL | 0 refills | Status: DC

## 2017-09-07 NOTE — Progress Notes (Signed)
Name: Karen Hooper   MRN: [id number]    DOB: 05-07-1932   Date:09/07/2017       Progress Note  Subjective  Chief Complaint  Chief Complaint  Patient presents with  . Mouth Lesions    has "inside of my mouth is broken out and places on my lips"- has been sick x 2 weeks, was trying to let it run it's course    Mouth Lesions   The current episode started 2 days ago. The onset was sudden. The problem has been gradually improving. The problem is mild. Nothing relieves the symptoms. Associated symptoms include congestion, mouth sores, cough and rash. Pertinent negatives include no fever, no decreased vision, no abdominal pain, no constipation, no diarrhea, no nausea, no ear discharge, no ear pain, no headaches, no hearing loss, no rhinorrhea, no sore throat, no stridor, no swollen glands, no neck pain, no wheezing, no eye pain and no eye redness.  Rash  This is a new problem. The current episode started more than 1 month ago. The problem is unchanged. The affected locations include the face. The rash is characterized by scaling. Associated symptoms include congestion and coughing. Pertinent negatives include no anorexia, diarrhea, eye pain, fever, joint pain, rhinorrhea, shortness of breath or sore throat. The treatment provided moderate relief.  Sinusitis  This is a new problem. The current episode started in the past 7 days. The problem has been gradually worsening since onset. Associated symptoms include congestion, coughing and sinus pressure. Pertinent negatives include no chills, ear pain, headaches, neck pain, shortness of breath, sore throat or swollen glands. Past treatments include nothing.  Cough  This is a new problem. The current episode started in the past 7 days. The problem has been gradually worsening. The cough is productive of purulent sputum (thick green/yellow). Associated symptoms include nasal congestion, postnasal drip and a rash. Pertinent negatives include no chest pain,  chills, ear pain, eye redness, fever, headaches, heartburn, myalgias, rhinorrhea, sore throat, shortness of breath, weight loss or wheezing. Nothing aggravates the symptoms. There is no history of environmental allergies.    No problem-specific Assessment & Plan notes found for this encounter.   Past Medical History:  Diagnosis Date  . Allergy   . Anxiety   . Hyperlipidemia   . Hypertension     Past Surgical History:  Procedure Laterality Date  . CATARACT EXTRACTION, BILATERAL    . VAGINAL HYSTERECTOMY      Family History  Problem Relation Age of Onset  . Breast cancer Sister 65  . Breast cancer Daughter 81    Social History   Socioeconomic History  . Marital status: Married    Spouse name: Not on file  . Number of children: Not on file  . Years of education: Not on file  . Highest education level: Not on file  Social Needs  . Financial resource strain: Not on file  . Food insecurity - worry: Not on file  . Food insecurity - inability: Not on file  . Transportation needs - medical: Not on file  . Transportation needs - non-medical: Not on file  Occupational History  . Not on file  Tobacco Use  . Smoking status: Never Smoker  . Smokeless tobacco: Never Used  Substance and Sexual Activity  . Alcohol use: No    Alcohol/week: 0.0 oz  . Drug use: No  . Sexual activity: Not Currently    Birth control/protection: Abstinence  Other Topics Concern  . Not on file  Social  History Narrative  . Not on file    Allergies  Allergen Reactions  . Atorvastatin   . Etodolac   . Ezetimibe     Outpatient Medications Prior to Visit  Medication Sig Dispense Refill  . hydrochlorothiazide (MICROZIDE) 12.5 MG capsule Take 1 capsule (12.5 mg total) by mouth daily. 90 capsule 1  . lisinopril (PRINIVIL,ZESTRIL) 20 MG tablet TAKE (1) TABLET BY MOUTH EVERY DAY 90 tablet 1  . loratadine (CLARITIN) 10 MG tablet Take 1 tablet (10 mg total) by mouth daily. otc 30 tablet 11  .  metoprolol succinate (TOPROL-XL) 50 MG 24 hr tablet Take 1 tablet (50 mg total) by mouth daily. 90 tablet 1  . Omega 3 1000 MG CAPS Take 2 capsules (2,000 mg total) by mouth 2 (two) times daily. 90 each 11  . traMADol (ULTRAM) 50 MG tablet Take 1 tablet (50 mg total) by mouth every 8 (eight) hours as needed. 30 tablet 0   No facility-administered medications prior to visit.     Review of Systems  Constitutional: Negative for chills, fever, malaise/fatigue and weight loss.  HENT: Positive for congestion, mouth sores, postnasal drip and sinus pressure. Negative for ear discharge, ear pain, hearing loss, rhinorrhea and sore throat.   Eyes: Negative for blurred vision, pain and redness.  Respiratory: Positive for cough. Negative for sputum production, shortness of breath, wheezing and stridor.   Cardiovascular: Negative for chest pain, palpitations and leg swelling.  Gastrointestinal: Negative for abdominal pain, anorexia, blood in stool, constipation, diarrhea, heartburn, melena and nausea.  Genitourinary: Negative for dysuria, frequency, hematuria and urgency.  Musculoskeletal: Negative for back pain, joint pain, myalgias and neck pain.  Skin: Positive for rash.  Neurological: Negative for dizziness, tingling, sensory change, focal weakness and headaches.  Endo/Heme/Allergies: Negative for environmental allergies and polydipsia. Does not bruise/bleed easily.  Psychiatric/Behavioral: Negative for depression and suicidal ideas. The patient is not nervous/anxious and does not have insomnia.      Objective  Vitals:   09/07/17 0927  BP: 110/62  Pulse: 64  Weight: 151 lb (68.5 kg)  Height: 5\' 4"  (1.626 m)    Physical Exam  Constitutional: She is well-developed, well-nourished, and in no distress. No distress.  HENT:  Head: Normocephalic and atraumatic.  Right Ear: Tympanic membrane, external ear and ear canal normal.  Left Ear: Tympanic membrane, external ear and ear canal normal.   Nose: Nose normal.  Mouth/Throat: Uvula is midline, oropharynx is clear and moist and mucous membranes are normal.  Eyes: Conjunctivae and EOM are normal. Pupils are equal, round, and reactive to light. Right eye exhibits no discharge. Left eye exhibits no discharge.  Neck: Normal range of motion. Neck supple. No JVD present. No thyromegaly present.  Cardiovascular: Normal rate, regular rhythm, normal heart sounds and intact distal pulses. Exam reveals no gallop and no friction rub.  No murmur heard. Pulmonary/Chest: Effort normal and breath sounds normal.  Abdominal: Soft. Bowel sounds are normal. She exhibits no mass. There is no tenderness. There is no guarding.  Musculoskeletal: Normal range of motion. She exhibits no edema.  Lymphadenopathy:    She has no cervical adenopathy.  Neurological: She is alert. She has normal reflexes.  Skin: Skin is warm and dry. Lesion noted. She is not diaphoretic.  scaling  Psychiatric: Mood and affect normal.  Nursing note and vitals reviewed.     Assessment & Plan  Problem List Items Addressed This Visit    None    Visit Diagnoses  Acute non-recurrent maxillary sinusitis    -  Primary   Relevant Medications   amoxicillin (AMOXIL) 500 MG capsule   guaiFENesin-codeine (ROBITUSSIN AC) 100-10 MG/5ML syrup   acyclovir (ZOVIRAX) 800 MG tablet   Stomatitis       Relevant Medications   acyclovir (ZOVIRAX) 800 MG tablet   Bronchitis       Relevant Medications   guaiFENesin-codeine (ROBITUSSIN AC) 100-10 MG/5ML syrup   Actinic keratosis       right facial   Relevant Orders   Ambulatory referral to Dermatology      Meds ordered this encounter  Medications  . amoxicillin (AMOXIL) 500 MG capsule    Sig: Take 1 capsule (500 mg total) by mouth 3 (three) times daily.    Dispense:  30 capsule    Refill:  0  . guaiFENesin-codeine (ROBITUSSIN AC) 100-10 MG/5ML syrup    Sig: Take 5 mLs by mouth 3 (three) times daily as needed for cough.     Dispense:  100 mL    Refill:  0  . acyclovir (ZOVIRAX) 800 MG tablet    Sig: Take 1 tablet (800 mg total) by mouth 2 (two) times daily.    Dispense:  10 tablet    Refill:  0      Dr. Hayden Rasmusseneanna Mayson Mcneish [facility]  09/07/17

## 2017-09-14 DIAGNOSIS — K120 Recurrent oral aphthae: Secondary | ICD-10-CM | POA: Diagnosis not present

## 2017-09-14 DIAGNOSIS — L570 Actinic keratosis: Secondary | ICD-10-CM | POA: Diagnosis not present

## 2017-10-12 DIAGNOSIS — K120 Recurrent oral aphthae: Secondary | ICD-10-CM | POA: Diagnosis not present

## 2017-10-12 DIAGNOSIS — L570 Actinic keratosis: Secondary | ICD-10-CM | POA: Diagnosis not present

## 2017-12-01 ENCOUNTER — Other Ambulatory Visit: Payer: Self-pay | Admitting: Family Medicine

## 2017-12-01 DIAGNOSIS — I10 Essential (primary) hypertension: Secondary | ICD-10-CM

## 2017-12-26 ENCOUNTER — Ambulatory Visit: Payer: Medicare Other | Admitting: Family Medicine

## 2017-12-26 ENCOUNTER — Encounter: Payer: Self-pay | Admitting: Family Medicine

## 2017-12-26 VITALS — BP 110/64 | HR 64 | Ht 64.0 in | Wt 152.0 lb

## 2017-12-26 DIAGNOSIS — E7849 Other hyperlipidemia: Secondary | ICD-10-CM

## 2017-12-26 DIAGNOSIS — Z780 Asymptomatic menopausal state: Secondary | ICD-10-CM

## 2017-12-26 DIAGNOSIS — I10 Essential (primary) hypertension: Secondary | ICD-10-CM

## 2017-12-26 DIAGNOSIS — J301 Allergic rhinitis due to pollen: Secondary | ICD-10-CM

## 2017-12-26 MED ORDER — HYDROCHLOROTHIAZIDE 12.5 MG PO CAPS: ORAL_CAPSULE | ORAL | 1 refills | Status: DC

## 2017-12-26 MED ORDER — LORATADINE 10 MG PO TABS: 10.0000 mg | ORAL_TABLET | Freq: Every day | ORAL | 4 refills | Status: DC

## 2017-12-26 MED ORDER — OMEGA 3 1000 MG PO CAPS: 2.0000 | ORAL_CAPSULE | Freq: Two times a day (BID) | ORAL | 11 refills | Status: DC

## 2017-12-26 MED ORDER — LISINOPRIL 20 MG PO TABS: ORAL_TABLET | ORAL | 1 refills | Status: DC

## 2017-12-26 MED ORDER — METOPROLOL SUCCINATE ER 50 MG PO TB24
ORAL_TABLET | ORAL | 1 refills | Status: DC
Start: 2017-12-26 — End: 2018-06-27

## 2017-12-26 NOTE — Assessment & Plan Note (Signed)
Controlled. Will continue lisinopril 20 mg and HCTZ 12.5 daily. Check renal panel.

## 2017-12-26 NOTE — Progress Notes (Signed)
Name: Karen Hooper   MRN: [id number]    DOB: 01/28/1932   Date:12/26/2017       Progress Note  Subjective  Chief Complaint  Chief Complaint  Patient presents with  . Hypertension  . Allergic Rhinitis     Hypertension  This is a chronic problem. The current episode started more than 1 year ago. The problem has been gradually improving since onset. The problem is controlled. Pertinent negatives include no anxiety, blurred vision, chest pain, headaches, malaise/fatigue, neck pain, orthopnea, palpitations, peripheral edema, PND, shortness of breath or sweats. There are no associated agents to hypertension. Risk factors for coronary artery disease include dyslipidemia and post-menopausal state. Past treatments include ACE inhibitors, beta blockers and diuretics. The current treatment provides moderate improvement. There are no compliance problems.  There is no history of angina, kidney disease, CAD/MI, CVA, heart failure, left ventricular hypertrophy, PVD or retinopathy. There is no history of chronic renal disease, a hypertension causing med or renovascular disease.  Hyperlipidemia  This is a chronic problem. The current episode started more than 1 year ago. The problem is controlled. Recent lipid tests were reviewed and are normal. She has no history of chronic renal disease, diabetes, hypothyroidism, liver disease, obesity or nephrotic syndrome. Factors aggravating her hyperlipidemia include thiazides. Pertinent negatives include no chest pain, focal weakness, myalgias or shortness of breath. Treatments tried: diet/omega 3. The current treatment provides moderate improvement of lipids. There are no compliance problems.  Risk factors for coronary artery disease include dyslipidemia and hypertension.  URI   This is a recurrent problem. The current episode started in the past 7 days. The problem has been waxing and waning. There has been no fever. Associated symptoms include rhinorrhea and sneezing.  Pertinent negatives include no abdominal pain, chest pain, congestion, coughing, diarrhea, dysuria, ear pain, headaches, joint pain, nausea, neck pain, rash, sore throat or wheezing. She has tried antihistamine for the symptoms. The treatment provided mild relief.    Essential hypertension Controlled. Will continue lisinopril 20 mg and HCTZ 12.5 daily. Check renal panel.  Familial multiple lipoprotein-type hyperlipidemia Controlled. Continue omega 3 1 gm daily. Check lipid panel   Past Medical History:  Diagnosis Date  . Allergy   . Anxiety   . Hyperlipidemia   . Hypertension     Past Surgical History:  Procedure Laterality Date  . CATARACT EXTRACTION, BILATERAL    . VAGINAL HYSTERECTOMY      Family History  Problem Relation Age of Onset  . Breast cancer Sister 47  . Breast cancer Daughter 69    Social History   Socioeconomic History  . Marital status: Married    Spouse name: Not on file  . Number of children: Not on file  . Years of education: Not on file  . Highest education level: Not on file  Occupational History  . Not on file  Social Needs  . Financial resource strain: Not on file  . Food insecurity:    Worry: Not on file    Inability: Not on file  . Transportation needs:    Medical: Not on file    Non-medical: Not on file  Tobacco Use  . Smoking status: Never Smoker  . Smokeless tobacco: Never Used  Substance and Sexual Activity  . Alcohol use: No    Alcohol/week: 0.0 oz  . Drug use: No  . Sexual activity: Not Currently    Birth control/protection: Abstinence  Lifestyle  . Physical activity:    Days per week:  Not on file    Minutes per session: Not on file  . Stress: Not on file  Relationships  . Social connections:    Talks on phone: Not on file    Gets together: Not on file    Attends religious service: Not on file    Active member of club or organization: Not on file    Attends meetings of clubs or organizations: Not on file     Relationship status: Not on file  . Intimate partner violence:    Fear of current or ex partner: Not on file    Emotionally abused: Not on file    Physically abused: Not on file    Forced sexual activity: Not on file  Other Topics Concern  . Not on file  Social History Narrative  . Not on file    Allergies  Allergen Reactions  . Atorvastatin   . Etodolac   . Ezetimibe     Outpatient Medications Prior to Visit  Medication Sig Dispense Refill  . hydrochlorothiazide (MICROZIDE) 12.5 MG capsule TAKE (1) CAPSULE BY MOUTH EVERY DAY 90 capsule 0  . lisinopril (PRINIVIL,ZESTRIL) 20 MG tablet TAKE (1) TABLET BY MOUTH EVERY DAY 90 tablet 1  . loratadine (CLARITIN) 10 MG tablet Take 1 tablet (10 mg total) by mouth daily. otc 30 tablet 11  . metoprolol succinate (TOPROL-XL) 50 MG 24 hr tablet TAKE (1) TABLET BY MOUTH EVERY DAY 90 tablet 0  . Omega 3 1000 MG CAPS Take 2 capsules (2,000 mg total) by mouth 2 (two) times daily. 90 each 11  . acyclovir (ZOVIRAX) 800 MG tablet Take 1 tablet (800 mg total) by mouth 2 (two) times daily. 10 tablet 0  . amoxicillin (AMOXIL) 500 MG capsule Take 1 capsule (500 mg total) by mouth 3 (three) times daily. 30 capsule 0  . guaiFENesin-codeine (ROBITUSSIN AC) 100-10 MG/5ML syrup Take 5 mLs by mouth 3 (three) times daily as needed for cough. 100 mL 0   No facility-administered medications prior to visit.     Review of Systems  Constitutional: Negative for chills, fever, malaise/fatigue and weight loss.  HENT: Positive for rhinorrhea and sneezing. Negative for congestion, ear discharge, ear pain and sore throat.   Eyes: Negative for blurred vision.  Respiratory: Negative for cough, sputum production, shortness of breath and wheezing.   Cardiovascular: Negative for chest pain, palpitations, orthopnea, leg swelling and PND.  Gastrointestinal: Negative for abdominal pain, blood in stool, constipation, diarrhea, heartburn, melena and nausea.  Genitourinary:  Negative for dysuria, frequency, hematuria and urgency.  Musculoskeletal: Negative for back pain, joint pain, myalgias and neck pain.  Skin: Negative for rash.  Neurological: Negative for dizziness, tingling, sensory change, focal weakness and headaches.  Endo/Heme/Allergies: Negative for environmental allergies and polydipsia. Does not bruise/bleed easily.  Psychiatric/Behavioral: Negative for depression and suicidal ideas. The patient is not nervous/anxious and does not have insomnia.      Objective  Vitals:   12/26/17 0803  BP: 110/64  Pulse: 64  Weight: 152 lb (68.9 kg)  Height: 5\' 4"  (1.626 m)    Physical Exam  Constitutional: She is oriented to person, place, and time. She appears well-developed and well-nourished.  HENT:  Head: Normocephalic.  Right Ear: Tympanic membrane and external ear normal.  Left Ear: Tympanic membrane and external ear normal.  Nose: Nose normal. No mucosal edema.  Mouth/Throat: Uvula is midline and oropharynx is clear and moist. No posterior oropharyngeal edema.  Eyes: Pupils are equal, round, and reactive  to light. Conjunctivae and EOM are normal. Lids are everted and swept, no foreign bodies found. Left eye exhibits no hordeolum. No foreign body present in the left eye. Right conjunctiva is not injected. Left conjunctiva is not injected. No scleral icterus.  Neck: Normal range of motion. Neck supple. No JVD present. No tracheal deviation present. No thyromegaly present.  Cardiovascular: Normal rate, regular rhythm, S1 normal, S2 normal, normal heart sounds and intact distal pulses. Exam reveals no gallop, no S3, no S4 and no friction rub.  No murmur heard.  No systolic murmur is present. Pulses:      Carotid pulses are 2+ on the right side, and 2+ on the left side.      Radial pulses are 2+ on the right side, and 2+ on the left side.       Femoral pulses are 2+ on the right side, and 2+ on the left side.      Popliteal pulses are 2+ on the right  side, and 2+ on the left side.       Dorsalis pedis pulses are 2+ on the right side, and 2+ on the left side.       Posterior tibial pulses are 2+ on the right side, and 2+ on the left side.  Pulmonary/Chest: Effort normal and breath sounds normal. No respiratory distress. She has no wheezes. She has no rales.  Abdominal: Soft. Bowel sounds are normal. She exhibits no mass. There is no hepatosplenomegaly. There is no tenderness. There is no rebound and no guarding.  Musculoskeletal: Normal range of motion. She exhibits no edema or tenderness.  Lymphadenopathy:    She has no cervical adenopathy.  Neurological: She is alert and oriented to person, place, and time. She has normal strength. She displays normal reflexes. No cranial nerve deficit.  Skin: Skin is warm. No rash noted.  Psychiatric: She has a normal mood and affect. Her mood appears not anxious. She does not exhibit a depressed mood.  Nursing note and vitals reviewed.     Assessment & Plan  Problem List Items Addressed This Visit      Cardiovascular and Mediastinum   Essential hypertension - Primary    Controlled. Will continue lisinopril 20 mg and HCTZ 12.5 daily. Check renal panel.      Relevant Medications   metoprolol succinate (TOPROL-XL) 50 MG 24 hr tablet   lisinopril (PRINIVIL,ZESTRIL) 20 MG tablet   hydrochlorothiazide (MICROZIDE) 12.5 MG capsule   Other Relevant Orders   Renal Function Panel     Respiratory   Allergic rhinitis   Relevant Medications   loratadine (CLARITIN) 10 MG tablet     Other   Familial multiple lipoprotein-type hyperlipidemia    Controlled. Continue omega 3 1 gm daily. Check lipid panel      Relevant Medications   metoprolol succinate (TOPROL-XL) 50 MG 24 hr tablet   lisinopril (PRINIVIL,ZESTRIL) 20 MG tablet   hydrochlorothiazide (MICROZIDE) 12.5 MG capsule   Omega 3 1000 MG CAPS   Other Relevant Orders   Lipid panel    Other Visit Diagnoses    Menopause       Relevant Orders    DG Bone Density      Meds ordered this encounter  Medications  . metoprolol succinate (TOPROL-XL) 50 MG 24 hr tablet    Sig: TAKE (1) TABLET BY MOUTH EVERY DAY    Dispense:  90 tablet    Refill:  1  . lisinopril (PRINIVIL,ZESTRIL) 20 MG tablet  Sig: TAKE (1) TABLET BY MOUTH EVERY DAY    Dispense:  90 tablet    Refill:  1  . hydrochlorothiazide (MICROZIDE) 12.5 MG capsule    Sig: TAKE (1) CAPSULE BY MOUTH EVERY DAY    Dispense:  90 capsule    Refill:  1  . Omega 3 1000 MG CAPS    Sig: Take 2 capsules (2,000 mg total) by mouth 2 (two) times daily.    Dispense:  90 each    Refill:  11  . loratadine (CLARITIN) 10 MG tablet    Sig: Take 1 tablet (10 mg total) by mouth daily. otc    Dispense:  90 tablet    Refill:  4      Dr. Elizabeth Sauereanna Haruko [facility]  12/26/17

## 2017-12-26 NOTE — Assessment & Plan Note (Signed)
Controlled. Continue omega 3 1 gm daily. Check lipid panel

## 2017-12-27 ENCOUNTER — Other Ambulatory Visit: Payer: Self-pay

## 2017-12-27 LAB — RENAL FUNCTION PANEL
ALBUMIN: 4.3 g/dL (ref 3.5–4.7)
BUN / CREAT RATIO: 27 (ref 12–28)
BUN: 29 mg/dL — ABNORMAL HIGH (ref 8–27)
CHLORIDE: 106 mmol/L (ref 96–106)
CO2: 20 mmol/L (ref 20–29)
Calcium: 10.3 mg/dL (ref 8.7–10.3)
Creatinine, Ser: 1.08 mg/dL — ABNORMAL HIGH (ref 0.57–1.00)
GFR calc non Af Amer: 48 mL/min/{1.73_m2} — ABNORMAL LOW (ref >59)
GFR, EST AFRICAN AMERICAN: 55 mL/min/{1.73_m2} — AB (ref >59)
GLUCOSE: 101 mg/dL — AB (ref 65–99)
POTASSIUM: 4.4 mmol/L (ref 3.5–5.2)
Phosphorus: 3 mg/dL (ref 2.5–4.5)
Sodium: 142 mmol/L (ref 134–144)

## 2017-12-27 LAB — LIPID PANEL
Chol/HDL Ratio: 3 ratio (ref 0.0–4.4)
Cholesterol, Total: 202 mg/dL — ABNORMAL HIGH (ref 100–199)
HDL: 68 mg/dL (ref >39)
LDL CALC: 110 mg/dL — AB (ref 0–99)
Triglycerides: 120 mg/dL (ref 0–149)
VLDL CHOLESTEROL CAL: 24 mg/dL (ref 5–40)

## 2018-01-31 DIAGNOSIS — T8522XD Displacement of intraocular lens, subsequent encounter: Secondary | ICD-10-CM | POA: Diagnosis not present

## 2018-03-30 ENCOUNTER — Ambulatory Visit (INDEPENDENT_AMBULATORY_CARE_PROVIDER_SITE_OTHER): Payer: Medicare Other

## 2018-03-30 DIAGNOSIS — Z23 Encounter for immunization: Secondary | ICD-10-CM | POA: Diagnosis not present

## 2018-06-27 ENCOUNTER — Encounter: Payer: Self-pay | Admitting: Family Medicine

## 2018-06-27 ENCOUNTER — Ambulatory Visit: Payer: Medicare Other | Admitting: Family Medicine

## 2018-06-27 DIAGNOSIS — I10 Essential (primary) hypertension: Secondary | ICD-10-CM

## 2018-06-27 DIAGNOSIS — E7849 Other hyperlipidemia: Secondary | ICD-10-CM | POA: Diagnosis not present

## 2018-06-27 DIAGNOSIS — J301 Allergic rhinitis due to pollen: Secondary | ICD-10-CM | POA: Diagnosis not present

## 2018-06-27 MED ORDER — METOPROLOL SUCCINATE ER 50 MG PO TB24: ORAL_TABLET | ORAL | 1 refills | Status: DC

## 2018-06-27 MED ORDER — LORATADINE 10 MG PO TABS: 10.0000 mg | ORAL_TABLET | Freq: Every day | ORAL | 4 refills | Status: DC

## 2018-06-27 MED ORDER — HYDROCHLOROTHIAZIDE 12.5 MG PO CAPS: ORAL_CAPSULE | ORAL | 1 refills | Status: DC

## 2018-06-27 MED ORDER — OMEGA 3 1000 MG PO CAPS: 2.0000 | ORAL_CAPSULE | Freq: Two times a day (BID) | ORAL | 11 refills | Status: DC

## 2018-06-27 MED ORDER — LISINOPRIL 20 MG PO TABS: ORAL_TABLET | ORAL | 1 refills | Status: DC

## 2018-06-27 NOTE — Progress Notes (Signed)
Date:  06/27/2018   Name:  Karen Hooper   DOB:  [birth year]   MRN:  [id number]   Chief Complaint: Hypertension and Allergic Rhinitis   Hypertension  This is a chronic problem. The current episode started more than 1 year ago. The problem is unchanged. The problem is controlled. Pertinent negatives include no anxiety, blurred vision, chest pain, headaches, malaise/fatigue, neck pain, orthopnea, palpitations, peripheral edema, PND, shortness of breath or sweats. There are no associated agents to hypertension. Risk factors for coronary artery disease include diabetes mellitus, dyslipidemia and post-menopausal state. Past treatments include ACE inhibitors, diuretics and beta blockers. The current treatment provides moderate improvement. There are no compliance problems.  There is no history of angina, kidney disease, CAD/MI, CVA, heart failure, left ventricular hypertrophy, PVD or retinopathy. There is no history of chronic renal disease, a hypertension causing med or renovascular disease.  Hyperlipidemia  This is a chronic problem. The current episode started more than 1 year ago. The problem is controlled. Recent lipid tests were reviewed and are normal. She has no history of chronic renal disease, diabetes, hypothyroidism, liver disease, obesity or nephrotic syndrome. Pertinent negatives include no chest pain, focal sensory loss, focal weakness, leg pain, myalgias or shortness of breath. Treatments tried: omega 3. The current treatment provides mild improvement of lipids. There are no compliance problems.  Risk factors for coronary artery disease include dyslipidemia and hypertension.    Review of Systems  Constitutional: Negative.  Negative for chills, fatigue, fever, malaise/fatigue and unexpected weight change.  HENT: Negative for congestion, ear discharge, ear pain, rhinorrhea, sinus pressure, sneezing and sore throat.   Eyes: Negative for blurred vision, photophobia, pain, discharge,  redness and itching.  Respiratory: Negative for cough, shortness of breath, wheezing and stridor.   Cardiovascular: Negative for chest pain, palpitations, orthopnea and PND.  Gastrointestinal: Negative for abdominal pain, blood in stool, constipation, diarrhea, nausea and vomiting.  Endocrine: Negative for cold intolerance, heat intolerance, polydipsia, polyphagia and polyuria.  Genitourinary: Negative for dysuria, flank pain, frequency, hematuria, menstrual problem, pelvic pain, urgency, vaginal bleeding and vaginal discharge.  Musculoskeletal: Negative for arthralgias, back pain, myalgias and neck pain.  Skin: Negative for rash.  Allergic/Immunologic: Negative for environmental allergies and food allergies.  Neurological: Negative for dizziness, focal weakness, weakness, light-headedness, numbness and headaches.  Hematological: Negative for adenopathy. Does not bruise/bleed easily.  Psychiatric/Behavioral: Negative for dysphoric mood. The patient is not nervous/anxious.     Patient Active Problem List   Diagnosis Date Noted  . CKD (chronic kidney disease) stage 3, GFR 30-59 ml/min (HCC) 06/25/2015  . 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 Allergic rhinitis 11/13/2014  . Anxiety 11/13/2014  . Essential hypertension 11/13/2014  . Routine general medical examination at a health care facility 11/13/2014  . Hot flash, menopausal 11/13/2014  . Screening for depression 11/13/2014    Allergies  Allergen Reactions  . Atorvastatin   . Etodolac   . Ezetimibe     Past Surgical History:  Procedure Laterality Date  . CATARACT EXTRACTION, BILATERAL    . VAGINAL HYSTERECTOMY      Social History   Tobacco Use  . Smoking status: Never Smoker  . Smokeless tobacco: Never Used  Substance Use Topics  . Alcohol use: No    Alcohol/week: 0.0 standard drinks  . Drug use: No     Medication list has been reviewed and updated.  Current Meds  Medication Sig  .  hydrochlorothiazide (MICROZIDE) 12.5 MG capsule TAKE (1)  CAPSULE BY MOUTH EVERY DAY  . lisinopril (PRINIVIL,ZESTRIL) 20 MG tablet TAKE (1) TABLET BY MOUTH EVERY DAY  . loratadine (CLARITIN) 10 MG tablet Take 1 tablet (10 mg total) by mouth daily. otc  . metoprolol succinate (TOPROL-XL) 50 MG 24 hr tablet TAKE (1) TABLET BY MOUTH EVERY DAY  . Omega 3 1000 MG CAPS Take 2 capsules (2,000 mg total) by mouth 2 (two) times daily.    PHQ 2/9 Scores 06/27/2018 12/26/2017 12/22/2016 12/23/2015  PHQ - 2 Score 0 0 0 0  PHQ- 9 Score 0 0 - -    Physical Exam Vitals signs and nursing note reviewed.  Constitutional:      General: She is not in acute distress.    Appearance: She is not diaphoretic.  HENT:     Head: Normocephalic and atraumatic.     Right Ear: Tympanic membrane and external ear normal.     Left Ear: Tympanic membrane and external ear normal.     Nose: Nose normal. No congestion.  Eyes:     General:        Right eye: No discharge.        Left eye: No discharge.     Conjunctiva/sclera: Conjunctivae normal.     Pupils: Pupils are equal, round, and reactive to light.  Neck:     Musculoskeletal: Normal range of motion and neck supple.     Thyroid: No thyromegaly.     Vascular: No JVD.  Cardiovascular:     Rate and Rhythm: Normal rate and regular rhythm.     Pulses:          Carotid pulses are 2+ on the right side and 2+ on the left side.      Radial pulses are 2+ on the right side and 2+ on the left side.       Femoral pulses are 2+ on the right side and 2+ on the left side.      Popliteal pulses are 2+ on the right side and 2+ on the left side.       Dorsalis pedis pulses are 2+ on the right side and 2+ on the left side.       Posterior tibial pulses are 2+ on the right side and 2+ on the left side.     Heart sounds: Normal heart sounds, S1 normal and S2 normal. Heart sounds not distant. No murmur. No systolic murmur. No diastolic murmur. No friction rub. No gallop. No S3 or S4  sounds.   Pulmonary:     Effort: Pulmonary effort is normal.     Breath sounds: Normal breath sounds. No wheezing or rhonchi.  Abdominal:     General: Bowel sounds are normal.     Palpations: Abdomen is soft. There is no mass.     Tenderness: There is no abdominal tenderness. There is no guarding.  Musculoskeletal: Normal range of motion.  Lymphadenopathy:     Cervical: No cervical adenopathy.  Skin:    General: Skin is warm and dry.  Neurological:     Mental Status: She is alert.     Deep Tendon Reflexes: Reflexes are normal and symmetric.     BP 110/70   Pulse 64   Ht 5\' 4"  (1.626 m)   Wt 150 lb (68 kg)   BMI 25.75 kg/m   Assessment and Plan: 1. Essential hypertension Chronic, stable on meds- refill metoprolol, lisinopril and HCTZ - metoprolol succinate (TOPROL-XL) 50 MG 24 hr tablet; TAKE (1)  TABLET BY MOUTH EVERY DAY  Dispense: 90 tablet; Refill: 1 - lisinopril (PRINIVIL,ZESTRIL) 20 MG tablet; TAKE (1) TABLET BY MOUTH EVERY DAY  Dispense: 90 tablet; Refill: 1 - hydrochlorothiazide (MICROZIDE) 12.5 MG capsule; TAKE (1) CAPSULE BY MOUTH EVERY DAY  Dispense: 90 capsule; Refill: 1  2. Seasonal allergic rhinitis due to pollen Chronic, stable- refill loratadine - loratadine (CLARITIN) 10 MG tablet; Take 1 tablet (10 mg total) by mouth daily. otc  Dispense: 90 tablet; Refill: 4  3. Familial multiple lipoprotein-type hyperlipidemia Chronic, stable on med- continue otc Omega 3 - Omega 3 1000 MG CAPS; Take 2 capsules (2,000 mg total) by mouth 2 (two) times daily.  Dispense: 90 each; Refill: 11

## 2018-07-02 DIAGNOSIS — M1712 Unilateral primary osteoarthritis, left knee: Secondary | ICD-10-CM | POA: Diagnosis not present

## 2018-07-12 ENCOUNTER — Other Ambulatory Visit: Payer: Self-pay | Admitting: Family Medicine

## 2018-07-12 DIAGNOSIS — Z1231 Encounter for screening mammogram for malignant neoplasm of breast: Secondary | ICD-10-CM

## 2018-08-13 ENCOUNTER — Encounter: Payer: Self-pay | Admitting: Family Medicine

## 2018-08-13 ENCOUNTER — Ambulatory Visit
Admission: RE | Admit: 2018-08-13 | Discharge: 2018-08-13 | Disposition: A | Discharge status: Home / Self Care | Payer: Medicare Other | Source: Home / Self Care | Attending: Family Medicine | Admitting: Family Medicine

## 2018-08-13 ENCOUNTER — Ambulatory Visit
Admission: RE | Admit: 2018-08-13 | Discharge: 2018-08-13 | Disposition: A | Discharge status: Home / Self Care | Payer: Medicare Other | Source: Ambulatory Visit | Attending: Family Medicine | Admitting: Family Medicine

## 2018-08-13 ENCOUNTER — Ambulatory Visit: Payer: Medicare Other | Admitting: Family Medicine

## 2018-08-13 VITALS — BP 102/60 | HR 72 | Ht 64.0 in | Wt 147.0 lb

## 2018-08-13 DIAGNOSIS — W19XXXA Unspecified fall, initial encounter: Secondary | ICD-10-CM

## 2018-08-13 DIAGNOSIS — E860 Dehydration: Secondary | ICD-10-CM | POA: Diagnosis not present

## 2018-08-13 DIAGNOSIS — I9589 Other hypotension: Secondary | ICD-10-CM

## 2018-08-13 DIAGNOSIS — S161XXA Strain of muscle, fascia and tendon at neck level, initial encounter: Secondary | ICD-10-CM | POA: Insufficient documentation

## 2018-08-13 DIAGNOSIS — E861 Hypovolemia: Secondary | ICD-10-CM

## 2018-08-13 DIAGNOSIS — R1030 Lower abdominal pain, unspecified: Secondary | ICD-10-CM | POA: Diagnosis not present

## 2018-08-13 DIAGNOSIS — M47812 Spondylosis without myelopathy or radiculopathy, cervical region: Secondary | ICD-10-CM | POA: Diagnosis not present

## 2018-08-13 DIAGNOSIS — Z1231 Encounter for screening mammogram for malignant neoplasm of breast: Secondary | ICD-10-CM

## 2018-08-13 DIAGNOSIS — N3090 Cystitis, unspecified without hematuria: Secondary | ICD-10-CM

## 2018-08-13 DIAGNOSIS — M542 Cervicalgia: Secondary | ICD-10-CM | POA: Diagnosis not present

## 2018-08-13 LAB — POCT URINALYSIS DIPSTICK
BILIRUBIN UA: NEGATIVE
Glucose, UA: NEGATIVE
KETONES UA: NEGATIVE
Nitrite, UA: NEGATIVE
Protein, UA: NEGATIVE
Spec Grav, UA: 1.025 (ref 1.010–1.025)
Urobilinogen, UA: 0.2 E.U./dL
pH, UA: 5 (ref 5.0–8.0)

## 2018-08-13 LAB — HEMOCCULT GUIAC POC 1CARD (OFFICE): Fecal Occult Blood, POC: NEGATIVE

## 2018-08-13 MED ORDER — LISINOPRIL 10 MG PO TABS: 10.0000 mg | ORAL_TABLET | Freq: Every day | ORAL | 3 refills | Status: DC

## 2018-08-13 MED ORDER — CEPHALEXIN 500 MG PO CAPS: 500.0000 mg | ORAL_CAPSULE | Freq: Two times a day (BID) | ORAL | 0 refills | Status: DC

## 2018-08-13 NOTE — Progress Notes (Signed)
Date:  08/13/2018   Name:  Karen Hooper   DOB:  1931-09-10   MRN:  [id number]   Chief Complaint: Abdominal Pain (mid abdominal pain)  Abdominal Pain  This is a new problem. The current episode started in the past 7 days. The onset quality is sudden. The problem occurs intermittently. Progression since onset: comes and goes. The pain is located in the suprapubic region. The patient is experiencing no pain (presently). The quality of the pain is aching. The abdominal pain does not radiate. Associated symptoms include frequency. Pertinent negatives include no anorexia, arthralgias, belching, constipation, diarrhea, dysuria, fever, flatus, headaches, hematochezia, hematuria, melena, myalgias, nausea, vomiting or weight loss. Associated symptoms comments: urgency. Nothing aggravates the pain. She has tried nothing for the symptoms. The treatment provided no relief. There is no history of abdominal surgery, colon cancer, Crohn's disease, gallstones, GERD, irritable bowel syndrome, pancreatitis, PUD or ulcerative colitis.  Fall  The accident occurred 2 days ago (Saturday night). Fall occurred: looking at thermostat. She landed on hard floor. There was no blood loss. The point of impact was the head. The pain is at a severity of 3/10 ("just sore"). The pain is mild. Associated symptoms include abdominal pain. Pertinent negatives include no fever, headaches, hearing loss, hematuria, nausea, numbness or vomiting.    Review of Systems  Constitutional: Negative.  Negative for chills, fatigue, fever, unexpected weight change and weight loss.  HENT: Negative for congestion, ear discharge, ear pain, rhinorrhea, sinus pressure, sneezing and sore throat.   Eyes: Negative for photophobia, pain, discharge, redness and itching.  Respiratory: Negative for cough, shortness of breath, wheezing and stridor.   Gastrointestinal: Positive for abdominal pain. Negative for anorexia, blood in stool, constipation,  diarrhea, flatus, hematochezia, melena, nausea and vomiting.  Endocrine: Negative for cold intolerance, heat intolerance, polydipsia, polyphagia and polyuria.  Genitourinary: Positive for frequency. Negative for dysuria, flank pain, hematuria, menstrual problem, pelvic pain, urgency, vaginal bleeding and vaginal discharge.  Musculoskeletal: Negative for arthralgias, back pain and myalgias.  Skin: Negative for rash.  Allergic/Immunologic: Negative for environmental allergies and food allergies.  Neurological: Negative for dizziness, weakness, light-headedness, numbness and headaches.  Hematological: Negative for adenopathy. Does not bruise/bleed easily.  Psychiatric/Behavioral: Negative for dysphoric mood. The patient is not nervous/anxious.     Patient Active Problem List   Diagnosis Date Noted  . CKD (chronic kidney disease) stage 3, GFR 30-59 ml/min (HCC) 06/25/2015  . 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 Allergic rhinitis 11/13/2014  . Anxiety 11/13/2014  . Essential hypertension 11/13/2014  . Routine general medical examination at a health care facility 11/13/2014  . Hot flash, menopausal 11/13/2014  . Screening for depression 11/13/2014    Allergies  Allergen Reactions  . Atorvastatin   . Etodolac   . Ezetimibe     Past Surgical History:  Procedure Laterality Date  . CATARACT EXTRACTION, BILATERAL    . VAGINAL HYSTERECTOMY      Social History   Tobacco Use  . Smoking status: Never Smoker  . Smokeless tobacco: Never Used  Substance Use Topics  . Alcohol use: No    Alcohol/week: 0.0 standard drinks  . Drug use: No     Medication list has been reviewed and updated.  Current Meds  Medication Sig  . hydrochlorothiazide (MICROZIDE) 12.5 MG capsule TAKE (1) CAPSULE BY MOUTH EVERY DAY  . lisinopril (PRINIVIL,ZESTRIL) 20 MG tablet TAKE (1) TABLET BY MOUTH EVERY DAY  . loratadine (CLARITIN) 10 MG tablet Take 1  tablet (10 mg total) by mouth  daily. otc  . metoprolol succinate (TOPROL-XL) 50 MG 24 hr tablet TAKE (1) TABLET BY MOUTH EVERY DAY  . Omega 3 1000 MG CAPS Take 2 capsules (2,000 mg total) by mouth 2 (two) times daily.    PHQ 2/9 Scores 06/27/2018 12/26/2017 12/22/2016 12/23/2015  PHQ - 2 Score 0 0 0 0  PHQ- 9 Score 0 0 - -    Physical Exam Vitals signs and nursing note reviewed.  Constitutional:      General: She is not in acute distress.    Appearance: She is not diaphoretic.  HENT:     Head: Normocephalic and atraumatic.     Right Ear: External ear normal.     Left Ear: External ear normal.     Nose: Nose normal.  Eyes:     General:        Right eye: No discharge.        Left eye: No discharge.     Conjunctiva/sclera: Conjunctivae normal.     Pupils: Pupils are equal, round, and reactive to light.  Neck:     Musculoskeletal: Normal range of motion and neck supple.     Thyroid: No thyromegaly.     Vascular: No JVD.  Cardiovascular:     Rate and Rhythm: Normal rate and regular rhythm.     Heart sounds: Normal heart sounds and S1 normal. No murmur. No friction rub. No gallop.   Pulmonary:     Effort: Pulmonary effort is normal.     Breath sounds: Normal breath sounds.  Abdominal:     General: Bowel sounds are normal.     Palpations: Abdomen is soft. There is no hepatomegaly, splenomegaly or mass.     Tenderness: There is no abdominal tenderness. There is no right CVA tenderness, left CVA tenderness, guarding or rebound.     Hernia: No hernia is present.  Genitourinary:    Rectum: Normal. Guaiac result negative. No mass.  Musculoskeletal: Normal range of motion.     Cervical back: She exhibits tenderness and spasm.  Lymphadenopathy:     Cervical: No cervical adenopathy.  Skin:    General: Skin is warm and dry.  Neurological:     Mental Status: She is alert.     Deep Tendon Reflexes: Reflexes are normal and symmetric.     BP 102/60   Pulse 72   Wt 147 lb (66.7 kg)   BMI 25.23 kg/m    Assessment and Plan:  1. Lower abdominal pain And has been having suprapubic discomfort for approximately a week.  Will check a CBC with differential reviewed her last colonoscopy which was in 2004 which did not note any abnormality other than polyp.  Will check a urinalysis. - CBC with Differential/Platelet - POCT Occult Blood Stool  2. Dehydration Patient exam is consistent with dehydration she had an episode of near syncope in which she was looking at a thermostat and she fell backwards hitting her head.  Patient did not lose consciousness.  Rehydration was discussed with patient and patient will comply.  3. Hypotension due to hypovolemia Was noted to have hypotension for which we will discontinue her hydrochlorothiazide diuretic and will decrease her lisinopril to 10 mg.  Recheck in 6 weeks - lisinopril (PRINIVIL,ZESTRIL) 10 MG tablet; Take 1 tablet (10 mg total) by mouth daily.  Dispense: 90 tablet; Refill: 3  4. Fall, initial encounter Patient encounter a fall where she struck her head on the floor over  the weekend patient has some discomfort of the neck exam is unremarkable except for spasm will do spine x-ray to rule out any abnormality. - DG Cervical Spine Complete; Future  5. Strain of neck muscle, initial encounter Patient does have bilateral muscle spasm of the trapezius consistent with a strain of the cervical spine. - DG Cervical Spine Complete; Future  6. Cystitis Patient's urinalysis is consistent with an early cystitis, will initiate cephalexin 500 mg twice a day for 3 days. - cephALEXin (KEFLEX) 500 MG capsule; Take 1 capsule (500 mg total) by mouth 2 (two) times daily.  Dispense: 3 capsule; Refill: 0 - POCT urinalysis dipstick

## 2018-08-13 NOTE — Patient Instructions (Signed)
Dehydration, Adult  Dehydration is when there is not enough fluid or water in your body. This happens when you lose more fluids than you take in. Dehydration can range from mild to very bad. It should be treated right away to keep it from getting very bad. Symptoms of mild dehydration may include:  Thirst.  Dry lips.  Slightly dry mouth.  Dry, warm skin.  Dizziness. Symptoms of moderate dehydration may include:  Very dry mouth.  Muscle cramps.  Dark pee (urine). Pee may be the color of tea.  Your body making less pee.  Your eyes making fewer tears.  Heartbeat that is uneven or faster than normal (palpitations).  Headache.  Light-headedness, especially when you stand up from sitting.  Fainting (syncope). Symptoms of very bad dehydration may include:  Changes in skin, such as: ? Cold and clammy skin. ? Blotchy (mottled) or pale skin. ? Skin that does not quickly return to normal after being lightly pinched and let go (poor skin turgor).  Changes in body fluids, such as: ? Feeling very thirsty. ? Your eyes making fewer tears. ? Not sweating when body temperature is high, such as in hot weather. ? Your body making very little pee.  Changes in vital signs, such as: ? Weak pulse. ? Pulse that is more than 100 beats a minute when you are sitting still. ? Fast breathing. ? Low blood pressure.  Other changes, such as: ? Sunken eyes. ? Cold hands and feet. ? Confusion. ? Lack of energy (lethargy). ? Trouble waking up from sleep. ? Short-term weight loss. ? Unconsciousness. Follow these instructions at home:   If told by your doctor, drink an ORS: ? Make an ORS by using instructions on the package. ? Start by drinking small amounts, about  cup (120 mL) every 5-10 minutes. ? Slowly drink more until you have had the amount that your doctor said to have.  Drink enough clear fluid to keep your pee clear or pale yellow. If you were told to drink an ORS, finish the  ORS first, then start slowly drinking clear fluids. Drink fluids such as: ? Water. Do not drink only water by itself. Doing that can make the salt (sodium) level in your body get too low (hyponatremia). ? Ice chips. ? Fruit juice that you have added water to (diluted). ? Low-calorie sports drinks.  Avoid: ? Alcohol. ? Drinks that have a lot of sugar. These include high-calorie sports drinks, fruit juice that does not have water added, and soda. ? Caffeine. ? Foods that are greasy or have a lot of fat or sugar.  Take over-the-counter and prescription medicines only as told by your doctor.  Do not take salt tablets. Doing that can make the salt level in your body get too high (hypernatremia).  Eat foods that have minerals (electrolytes). Examples include bananas, oranges, potatoes, tomatoes, and spinach.  Keep all follow-up visits as told by your doctor. This is important. Contact a doctor if:  You have belly (abdominal) pain that: ? Gets worse. ? Stays in one area (localizes).  You have a rash.  You have a stiff neck.  You get angry or annoyed more easily than normal (irritability).  You are more sleepy than normal.  You have a harder time waking up than normal.  You feel: ? Weak. ? Dizzy. ? Very thirsty.  You have peed (urinated) only a small amount of very dark pee during 6-8 hours. Get help right away if:  You have   symptoms of very bad dehydration.  You cannot drink fluids without throwing up (vomiting).  Your symptoms get worse with treatment.  You have a fever.  You have a very bad headache.  You are throwing up or having watery poop (diarrhea) and it: ? Gets worse. ? Does not go away.  You have blood or something green (bile) in your throw-up.  You have blood in your poop (stool). This may cause poop to look black and tarry.  You have not peed in 6-8 hours.  You pass out (faint).  Your heart rate when you are sitting still is more than 100 beats a  minute.  You have trouble breathing. This information is not intended to replace advice given to you by your health care provider. Make sure you discuss any questions you have with your health care provider. Document Released: 04/23/2009 Document Revised: 01/15/2016 Document Reviewed: 08/21/2015 [employer] Interactive Patient Education  2019 [employer]  

## 2018-08-14 LAB — CBC WITH DIFFERENTIAL/PLATELET
BASOS: 1 %
Basophils Absolute: 0.1 10*3/uL (ref 0.0–0.2)
EOS (ABSOLUTE): 0.1 10*3/uL (ref 0.0–0.4)
Eos: 2 %
Hematocrit: 36 % (ref 34.0–46.6)
Hemoglobin: 12.3 g/dL (ref 11.1–15.9)
IMMATURE GRANS (ABS): 0 10*3/uL (ref 0.0–0.1)
IMMATURE GRANULOCYTES: 0 %
Lymphocytes Absolute: 1.6 10*3/uL (ref 0.7–3.1)
Lymphs: 26 %
MCH: 32.9 pg (ref 26.6–33.0)
MCHC: 34.2 g/dL (ref 31.5–35.7)
MCV: 96 fL (ref 79–97)
Monocytes Absolute: 0.7 10*3/uL (ref 0.1–0.9)
Monocytes: 11 %
NEUTROS PCT: 60 %
Neutrophils Absolute: 3.8 10*3/uL (ref 1.4–7.0)
Platelets: 149 10*3/uL — ABNORMAL LOW (ref 150–450)
RBC: 3.74 x10E6/uL — ABNORMAL LOW (ref 3.77–5.28)
RDW: 11.6 % — ABNORMAL LOW (ref 11.7–15.4)
WBC: 6.3 10*3/uL (ref 3.4–10.8)

## 2018-08-17 ENCOUNTER — Encounter: Payer: Self-pay | Admitting: Family Medicine

## 2018-08-17 ENCOUNTER — Ambulatory Visit: Payer: Medicare Other | Admitting: Family Medicine

## 2018-08-17 ENCOUNTER — Ambulatory Visit
Admission: RE | Admit: 2018-08-17 | Discharge: 2018-08-17 | Disposition: A | Discharge status: Home / Self Care | Payer: Medicare Other | Attending: Family Medicine | Admitting: Family Medicine

## 2018-08-17 ENCOUNTER — Ambulatory Visit
Admission: RE | Admit: 2018-08-17 | Discharge: 2018-08-17 | Disposition: A | Discharge status: Home / Self Care | Payer: Medicare Other | Source: Ambulatory Visit | Attending: Family Medicine | Admitting: Family Medicine

## 2018-08-17 VITALS — BP 124/60 | HR 100 | Temp 98.0°F | Ht 64.0 in | Wt 149.0 lb

## 2018-08-17 DIAGNOSIS — E860 Dehydration: Secondary | ICD-10-CM | POA: Diagnosis not present

## 2018-08-17 DIAGNOSIS — R1033 Periumbilical pain: Secondary | ICD-10-CM

## 2018-08-17 DIAGNOSIS — K5900 Constipation, unspecified: Secondary | ICD-10-CM | POA: Diagnosis not present

## 2018-08-17 NOTE — Progress Notes (Signed)
Date:  08/17/2018   Name:  Karen Hooper   DOB:  03/20/1932   MRN:  [id number]   Chief Complaint: Abdominal Pain (Upper stomach pain. If taking tums it goes away. On and off pain. Cannot describe pain she is feeling. Doesn't go to the bathroom easily. Constipated. Goes once a day.  )  Abdominal Pain  This is a new problem. The current episode started 1 to 4 weeks ago (1-2 weeks). The onset quality is gradual. The problem occurs intermittently. The problem has been waxing and waning. The pain is located in the periumbilical region. The pain is at a severity of 2/10. The pain is mild. Pertinent negatives include no arthralgias, constipation, diarrhea, dysuria, fever, frequency, headaches, hematuria, myalgias, nausea or vomiting.    Review of Systems  Constitutional: Negative.  Negative for chills, fatigue, fever and unexpected weight change.  HENT: Negative for congestion, ear discharge, ear pain, rhinorrhea, sinus pressure, sneezing and sore throat.   Eyes: Negative for photophobia, pain, discharge, redness and itching.  Respiratory: Negative for cough, shortness of breath, wheezing and stridor.   Gastrointestinal: Positive for abdominal pain. Negative for blood in stool, constipation, diarrhea, nausea and vomiting.  Endocrine: Negative for cold intolerance, heat intolerance, polydipsia, polyphagia and polyuria.  Genitourinary: Negative for dysuria, flank pain, frequency, hematuria, menstrual problem, pelvic pain, urgency, vaginal bleeding and vaginal discharge.  Musculoskeletal: Negative for arthralgias, back pain and myalgias.  Skin: Negative for rash.  Allergic/Immunologic: Negative for environmental allergies and food allergies.  Neurological: Negative for dizziness, weakness, light-headedness, numbness and headaches.  Hematological: Negative for adenopathy. Does not bruise/bleed easily.  Psychiatric/Behavioral: Negative for dysphoric mood. The patient is not nervous/anxious.      Patient Active Problem List   Diagnosis Date Noted  . CKD (chronic kidney disease) stage 3, GFR 30-59 ml/min (HCC) 06/25/2015  . 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 Allergic rhinitis 11/13/2014  . Anxiety 11/13/2014  . Essential hypertension 11/13/2014  . Routine general medical examination at a health care facility 11/13/2014  . Hot flash, menopausal 11/13/2014  . Screening for depression 11/13/2014    Allergies  Allergen Reactions  . Atorvastatin   . Etodolac   . Ezetimibe     Past Surgical History:  Procedure Laterality Date  . CATARACT EXTRACTION, BILATERAL    . VAGINAL HYSTERECTOMY      Social History   Tobacco Use  . Smoking status: Never Smoker  . Smokeless tobacco: Never Used  Substance Use Topics  . Alcohol use: No    Alcohol/week: 0.0 standard drinks  . Drug use: No     Medication list has been reviewed and updated.  Current Meds  Medication Sig  . lisinopril (PRINIVIL,ZESTRIL) 10 MG tablet Take 1 tablet (10 mg total) by mouth daily.  Marland Kitchen loratadine (CLARITIN) 10 MG tablet Take 1 tablet (10 mg total) by mouth daily. otc  . metoprolol succinate (TOPROL-XL) 50 MG 24 hr tablet TAKE (1) TABLET BY MOUTH EVERY DAY  . Omega 3 1000 MG CAPS Take 2 capsules (2,000 mg total) by mouth 2 (two) times daily.    PHQ 2/9 Scores 06/27/2018 12/26/2017 12/22/2016 12/23/2015  PHQ - 2 Score 0 0 0 0  PHQ- 9 Score 0 0 - -    Physical Exam Vitals signs and nursing note reviewed.  Constitutional:      General: She is not in acute distress.    Appearance: She is not diaphoretic.  HENT:     Head: Normocephalic  and atraumatic.     Right Ear: External ear normal.     Left Ear: External ear normal.     Nose: Nose normal.  Eyes:     General:        Right eye: No discharge.        Left eye: No discharge.     Conjunctiva/sclera: Conjunctivae normal.     Pupils: Pupils are equal, round, and reactive to light.  Neck:     Musculoskeletal: Normal  range of motion and neck supple.     Thyroid: No thyromegaly.     Vascular: No JVD.  Cardiovascular:     Rate and Rhythm: Normal rate and regular rhythm.     Heart sounds: Normal heart sounds. No murmur. No friction rub. No gallop.   Pulmonary:     Effort: Pulmonary effort is normal.     Breath sounds: Normal breath sounds.  Abdominal:     General: Abdomen is flat. Bowel sounds are normal. There is no distension or abdominal bruit. There are no signs of injury.     Palpations: Abdomen is soft. There is no mass.     Tenderness: There is abdominal tenderness in the periumbilical area. There is no right CVA tenderness, left CVA tenderness, guarding or rebound.  Musculoskeletal: Normal range of motion.  Lymphadenopathy:     Cervical: No cervical adenopathy.  Skin:    General: Skin is warm and dry.  Neurological:     Mental Status: She is alert.     Deep Tendon Reflexes: Reflexes are normal and symmetric.     BP 124/60   Pulse 100   Temp 98 F (36.7 C) (Oral)   Ht 5\' 4"  (1.626 m)   Wt 149 lb (67.6 kg)   SpO2 98%   BMI 25.58 kg/m   Assessment and Plan: 1. Periumbilical abdominal pain Acute. Follow up from visit on 08/13/2018. Patient complains of pain 2/10 on pain scale across periumbilical region. Will obtain STAT flat plate of abdomen. Start colace 100mg  bid and Miralax as directed. Called into pharmacy. - DG Abd 2 Views; Future  2. Dehydration Patient continues to be dehydrated today. She has been advised to push fluids

## 2018-08-22 ENCOUNTER — Other Ambulatory Visit: Payer: Self-pay

## 2018-08-22 NOTE — Progress Notes (Unsigned)
Spoke to family

## 2018-08-27 ENCOUNTER — Other Ambulatory Visit: Payer: Self-pay

## 2018-08-27 ENCOUNTER — Emergency Department: Payer: Medicare Other

## 2018-08-27 ENCOUNTER — Encounter: Payer: Self-pay | Admitting: Emergency Medicine

## 2018-08-27 ENCOUNTER — Emergency Department
Admission: EM | Admit: 2018-08-27 | Discharge: 2018-08-27 | Disposition: A | Discharge status: Home / Self Care | Payer: Medicare Other | Attending: Emergency Medicine | Admitting: Emergency Medicine

## 2018-08-27 DIAGNOSIS — I10 Essential (primary) hypertension: Secondary | ICD-10-CM | POA: Diagnosis not present

## 2018-08-27 DIAGNOSIS — R109 Unspecified abdominal pain: Secondary | ICD-10-CM

## 2018-08-27 DIAGNOSIS — K5900 Constipation, unspecified: Secondary | ICD-10-CM | POA: Insufficient documentation

## 2018-08-27 DIAGNOSIS — K279 Peptic ulcer, site unspecified, unspecified as acute or chronic, without hemorrhage or perforation: Secondary | ICD-10-CM | POA: Diagnosis not present

## 2018-08-27 DIAGNOSIS — Z79899 Other long term (current) drug therapy: Secondary | ICD-10-CM | POA: Diagnosis not present

## 2018-08-27 LAB — CBC
HCT: 37.9 % (ref 36.0–46.0)
Hemoglobin: 13 g/dL (ref 12.0–15.0)
MCH: 32.7 pg (ref 26.0–34.0)
MCHC: 34.3 g/dL (ref 30.0–36.0)
MCV: 95.2 fL (ref 80.0–100.0)
Platelets: 217 10*3/uL (ref 150–400)
RBC: 3.98 MIL/uL (ref 3.87–5.11)
RDW: 11.6 % (ref 11.5–15.5)
WBC: 8.3 10*3/uL (ref 4.0–10.5)
nRBC: 0 % (ref 0.0–0.2)

## 2018-08-27 LAB — COMPREHENSIVE METABOLIC PANEL
ALBUMIN: 4 g/dL (ref 3.5–5.0)
ALT: 15 U/L (ref 0–44)
AST: 26 U/L (ref 15–41)
Alkaline Phosphatase: 109 U/L (ref 38–126)
Anion gap: 7 (ref 5–15)
BUN: 18 mg/dL (ref 8–23)
CO2: 24 mmol/L (ref 22–32)
Calcium: 10.5 mg/dL — ABNORMAL HIGH (ref 8.9–10.3)
Chloride: 103 mmol/L (ref 98–111)
Creatinine, Ser: 0.88 mg/dL (ref 0.44–1.00)
GFR calc Af Amer: >60 mL/min (ref >60)
GFR calc non Af Amer: >60 mL/min (ref >60)
Glucose, Bld: 118 mg/dL — ABNORMAL HIGH (ref 70–99)
Potassium: 3.8 mmol/L (ref 3.5–5.1)
Sodium: 134 mmol/L — ABNORMAL LOW (ref 135–145)
Total Bilirubin: 0.8 mg/dL (ref 0.3–1.2)
Total Protein: 6.4 g/dL — ABNORMAL LOW (ref 6.5–8.1)

## 2018-08-27 LAB — URINALYSIS, COMPLETE (UACMP) WITH MICROSCOPIC
Bacteria, UA: NONE SEEN
Bilirubin Urine: NEGATIVE
Glucose, UA: NEGATIVE mg/dL
Ketones, ur: 5 mg/dL — AB
Nitrite: NEGATIVE
Protein, ur: NEGATIVE mg/dL
Specific Gravity, Urine: 1.014 (ref 1.005–1.030)
pH: 6 (ref 5.0–8.0)

## 2018-08-27 LAB — TROPONIN I: Troponin I: <0.03 ng/mL (ref <0.03)

## 2018-08-27 LAB — LIPASE, BLOOD: Lipase: 49 U/L (ref 11–51)

## 2018-08-27 MED ORDER — IOPAMIDOL (ISOVUE-300) INJECTION 61%
30.0000 mL | Freq: Once | INTRAVENOUS | Status: AC | PRN
  Administered 2018-08-27: 30 mL via ORAL

## 2018-08-27 MED ORDER — FAMOTIDINE 20 MG PO TABS: 20.0000 mg | ORAL_TABLET | Freq: Two times a day (BID) | ORAL | 1 refills | Status: DC

## 2018-08-27 MED ORDER — SODIUM CHLORIDE 0.9% FLUSH: 3.0000 mL | Freq: Once | INTRAVENOUS | Status: DC

## 2018-08-27 MED ORDER — IOHEXOL 300 MG/ML  SOLN
100.0000 mL | Freq: Once | INTRAMUSCULAR | Status: AC | PRN
  Administered 2018-08-27: 100 mL via INTRAVENOUS

## 2018-08-27 MED ORDER — POLYETHYLENE GLYCOL 3350 17 G PO PACK: 17.0000 g | PACK | Freq: Every day | ORAL | 0 refills | Status: DC

## 2018-08-27 NOTE — ED Provider Notes (Signed)
[facility] Emergency Department Provider Note  Time seen: 7:49 PM  I have reviewed the triage vital signs and the nursing notes.   HISTORY  Chief Complaint Abdominal Pain    HPI Karen Hooper is a 85 y.o. female with a past medical history of anxiety, hypertension, hyperlipidemia, CKD, presents to the emergency department for abdominal pain.  According to the patient for the past 4 to 5 weeks she has been experiencing right-sided abdominal pain.  Saw her primary care physician who ordered x-rays as well as treated her with 3 days of antibiotics for possible urinary tract infection.  Patient was told if her pain worsened or did not improve to go to the ER for possible CT scan.  Patient states her pain had been intermittent however today it was more constant on the right side.  Described as a mild to moderate dull pain.  Denies any association with food.  Patient states she is status post appendectomy but still has her gallbladder.  Denies any chest pain or trouble breathing.  Denies any nausea vomiting or diarrhea.  States her PCP thought she could be constipated recommended laxatives, produce bowel movements but no improvement in abdominal pain.  Patient denies any dysuria or frequency.  Largely negative review of systems.   Past Medical History:  Diagnosis Date  . Allergy   . Anxiety   . Hyperlipidemia   . Hypertension     Patient Active Problem List   Diagnosis Date Noted  . CKD (chronic kidney disease) stage 3, GFR 30-59 ml/min (HCC) 06/25/2015  . 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 Allergic rhinitis 11/13/2014  . Anxiety 11/13/2014  . Essential hypertension 11/13/2014  . Routine general medical examination at a health care facility 11/13/2014  . Hot flash, menopausal 11/13/2014  . Screening for depression 11/13/2014    Past Surgical History:  Procedure Laterality Date  . CATARACT EXTRACTION, BILATERAL    . VAGINAL  HYSTERECTOMY      Prior to Admission medications   Medication Sig Start Date End Date Taking? Authorizing Provider  lisinopril (PRINIVIL,ZESTRIL) 10 MG tablet Take 1 tablet (10 mg total) by mouth daily. 08/13/18   Duanne Limerick, MD  loratadine (CLARITIN) 10 MG tablet Take 1 tablet (10 mg total) by mouth daily. otc 06/27/18   Duanne Limerick, MD  metoprolol succinate (TOPROL-XL) 50 MG 24 hr tablet TAKE (1) TABLET BY MOUTH EVERY DAY 06/27/18   Duanne Limerick, MD  Omega 3 1000 MG CAPS Take 2 capsules (2,000 mg total) by mouth 2 (two) times daily. 06/27/18   Duanne Limerick, MD    Allergies  Allergen Reactions  . Atorvastatin   . Etodolac   . Ezetimibe     Family History  Problem Relation Age of Onset  . Breast cancer Sister 23  . Breast cancer Daughter 11    Social History Social History   Tobacco Use  . Smoking status: Never Smoker  . Smokeless tobacco: Never Used  Substance Use Topics  . Alcohol use: No    Alcohol/week: 0.0 standard drinks  . Drug use: No    Review of Systems Constitutional: Negative for fever. Cardiovascular: Negative for chest pain. Respiratory: Negative for shortness of breath. Gastrointestinal: Diffuse abdominal pain more so on the right side, mild to moderate.  Denies nausea vomiting or diarrhea. Genitourinary: Negative for urinary compaints Musculoskeletal: Negative for musculoskeletal complaints Skin: Negative for skin complaints  Neurological: Negative for headache All other ROS negative  ____________________________________________   PHYSICAL EXAM:  VITAL SIGNS: ED Triage Vitals  Enc Vitals Group     BP 08/27/18 1704 (!) 157/58     Pulse Rate 08/27/18 1704 73     Resp 08/27/18 1704 16     Temp 08/27/18 1704 97.8 F (36.6 C)     Temp Source 08/27/18 1704 Oral     SpO2 08/27/18 1704 99 %     Weight --      Height --      Head Circumference --      Peak Flow --      Pain Score 08/27/18 1705 10     Pain Loc --      Pain Edu? --       Excl. in GC? --    Constitutional: Alert and oriented. Well appearing and in no distress. Eyes: Normal exam ENT   Head: Normocephalic and atraumatic.   Mouth/Throat: Mucous membranes are moist. Cardiovascular: Normal rate, regular rhythm. Respiratory: Normal respiratory effort without tachypnea nor retractions. Breath sounds are clear  Gastrointestinal: Soft and nontender. No distention.   Musculoskeletal: Nontender with normal range of motion in all extremities.  Neurologic:  Normal speech and language. No gross focal neurologic deficits  Skin:  Skin is warm, dry and intact.  Psychiatric: Mood and affect are normal.  ____________________________________________   INITIAL IMPRESSION / ASSESSMENT AND PLAN / ED COURSE  Pertinent labs & imaging results that were available during my care of the patient were reviewed by me and considered in my medical decision making (see chart for details).  Patient presents to the emergency department with abdominal pain x4 to 5 weeks.  Differential is quite broad but would include diverticulitis, colitis, peptic ulcer disease, gastritis, urinary tract infection, mass/tumor, gallbladder disease.  On examination patient has mild fairly diffuse abdominal tenderness to palpation without rebound guarding or distention.  Patient appears to be slightly more tender in the right and left upper quadrants as well as epigastrium.  Patient's lab work is thus far reassuring.  Negative LFTs, normal lipase, normal white blood cell count.  Urinalysis appears normal.  I have added on a troponin as a precaution.  CT scan of the abdomen/pelvis is pending.  Patient care signed out to Dr. Alphonzo Lemmings.  ____________________________________________   FINAL CLINICAL IMPRESSION(S) / ED DIAGNOSES  Abdominal pain   Minna Antis, MD 08/27/18 [phone]

## 2018-08-27 NOTE — ED Provider Notes (Signed)
-----------------------------------------   11:01 PM on 08/27/2018 -----------------------------------------  Signed out to me by Dr. Lenard Lance, patient with abdominal pain for a prolonged period of time.  Thought to be constipation but they wanted to be sure.  CT scan does not show any acute pathology, she does have gastritis or likely PUD but no identified ulcer, I have counseled the family about this and the need for follow-up for GI but there is no evidence of perforation bleed or other complications from that.  Is likely a chronic condition.  Although radiology did not specifically, not, I have looked at her CT scan personally and there is a pretty significant amount of stool in the right colon which I think is likely contributing to her discomfort.  Nothing to suggest ischemia, we will start her on what I hope will be more aggressive GI medications including Pepcid for the upper and MiraLAX for the lower and we will have her follow closely with GI medicine.  She understands what to look out for in terms of peptic ulcer disease including melena and she has been given extensive return precautions and follow-up.   Jeanmarie Plant, MD 08/27/18 [phone]

## 2018-08-27 NOTE — ED Triage Notes (Signed)
PT c/o Rt sided abd pain xfew weeks, pt has been seen by PCP with neg xrays and blood work. PT was given antibiotics for early cystitis  Was told to follow up for possible CT scan if pain worsened. Denies vomiting  or diarrhea . PT A&OX4

## 2018-09-06 ENCOUNTER — Encounter: Payer: Self-pay | Admitting: Gastroenterology

## 2018-09-06 ENCOUNTER — Ambulatory Visit: Payer: Medicare Other | Admitting: Gastroenterology

## 2018-09-06 ENCOUNTER — Other Ambulatory Visit: Payer: Self-pay

## 2018-09-06 VITALS — BP 107/59 | HR 64 | Ht 64.0 in | Wt 152.8 lb

## 2018-09-06 DIAGNOSIS — R935 Abnormal findings on diagnostic imaging of other abdominal regions, including retroperitoneum: Secondary | ICD-10-CM | POA: Diagnosis not present

## 2018-09-06 DIAGNOSIS — R1013 Epigastric pain: Secondary | ICD-10-CM | POA: Diagnosis not present

## 2018-09-06 DIAGNOSIS — K5900 Constipation, unspecified: Secondary | ICD-10-CM

## 2018-09-06 NOTE — Progress Notes (Signed)
Karen BouillonVarnita Leanord Hooper [address]  [address]  [place], [place] [address]  Main: [phone]  Fax: [phone]   Gastroenterology Consultation  Referring Provider:     Duanne LimerickJones, Deanna C, MD Primary Care Physician:  Karen LimerickJones, Deanna C, MD Reason for Consultation:     Abdominal pain        HPI:    Chief Complaint  Patient presents with  . New Patient (Initial Visit)    ED f/u abdominal pain    Karen Hooper is a 85 y.o. y/o female referred for consultation & management  by Dr. Duanne LimerickJones, Deanna C, MD.  Patient recently went to the ER with 5 to 6-week history of abdominal pain and was diagnosed with constipation and states abdominal pain has completely resolved after resolution of constipation.  Also had one episode of emesis about 3 weeks ago that consisted of green bile.  No blood.  Denies any blood in stool.  However, patient and family reports some weight loss due to decreased appetite during these 5 to 6-week periods.  No dysphagia.  No heartburn.  No prior EGDs.  CT scan during the ER visit reported duodenitis.  Patient last colonoscopy was in 2004 by Dr. Henrene HawkingSiegel and one 6 mm colon polyp was removed.  Patient and family state they were told due to her age she does not need any further colonoscopies.  The pathology report from this procedure is not available.  Past Medical History:  Diagnosis Date  . Allergy   . Anxiety   . Hyperlipidemia   . Hypertension     Past Surgical History:  Procedure Laterality Date  . CATARACT EXTRACTION, BILATERAL    . VAGINAL HYSTERECTOMY      Prior to Admission medications   Medication Sig Start Date End Date Taking? Authorizing Provider  famotidine (PEPCID) 20 MG tablet Take 1 tablet (20 mg total) by mouth 2 (two) times daily. 08/27/18 08/27/19 Yes McShane, Rudy JewJames A, MD  lisinopril (PRINIVIL,ZESTRIL) 10 MG tablet Take 1 tablet (10 mg total) by mouth daily. 08/13/18  Yes Karen LimerickJones, Deanna C, MD  loratadine (CLARITIN) 10 MG tablet Take 1 tablet (10  mg total) by mouth daily. otc 06/27/18  Yes Karen LimerickJones, Deanna C, MD  metoprolol succinate (TOPROL-XL) 50 MG 24 hr tablet TAKE (1) TABLET BY MOUTH EVERY DAY 06/27/18  Yes Karen LimerickJones, Deanna C, MD  Omega 3 1000 MG CAPS Take 2 capsules (2,000 mg total) by mouth 2 (two) times daily. 06/27/18  Yes Karen LimerickJones, Deanna C, MD  polyethylene glycol [facility](MIRALAX) packet Take 17 g by mouth daily. 08/27/18  Yes Jeanmarie PlantMcShane, James A, MD    Family History  Problem Relation Age of Onset  . Breast cancer Sister 9171  . Breast cancer Daughter [id number]     Social History   Tobacco Use  . Smoking status: Never Smoker  . Smokeless tobacco: Never Used  Substance Use Topics  . Alcohol use: No    Alcohol/week: 0.0 standard drinks  . Drug use: No    Allergies as of 09/06/2018 - Review Complete 08/27/2018  Allergen Reaction Noted  . Atorvastatin  11/13/2014  . Etodolac  11/13/2014  . Ezetimibe  11/13/2014    Review of Systems:    All systems reviewed and negative except where noted in HPI.   Physical Exam:  BP (!) 107/59   Pulse 64   Ht 5\' 4"  (1.626 m)   Wt 152 lb 12.8 oz (69.3 kg)   BMI 26.23 kg/m  No LMP  recorded. Patient has had a hysterectomy. Psych:  Alert and cooperative. Normal mood and affect. General:   Alert,  Well-developed, well-nourished, pleasant and cooperative in NAD Head:  Normocephalic and atraumatic. Eyes:  Sclera clear, no icterus.   Conjunctiva pink. Ears:  Normal auditory acuity. Nose:  No deformity, discharge, or lesions. Mouth:  No deformity or lesions,oropharynx pink & moist. Neck:  Supple; no masses or thyromegaly. Abdomen:  Normal bowel sounds.  No bruits.  Soft, non-tender and non-distended without masses, hepatosplenomegaly or hernias noted.  No guarding or rebound tenderness.    Msk:  Symmetrical without gross deformities. Good, equal movement & strength bilaterally. Pulses:  Normal pulses noted. Extremities:  No clubbing or edema.  No cyanosis. Neurologic:  Alert and oriented x3;  grossly  normal neurologically. Skin:  Intact without significant lesions or rashes. No jaundice. Lymph Nodes:  No significant cervical adenopathy. Psych:  Alert and cooperative. Normal mood and affect.   Labs: CBC    Component Value Date/Time   WBC 8.3 08/27/2018 1706   RBC 3.98 08/27/2018 1706   HGB 13.0 08/27/2018 1706   HGB 12.3 08/13/2018 0907   HCT 37.9 08/27/2018 1706   HCT 36.0 08/13/2018 0907   PLT 217 08/27/2018 1706   PLT 149 (L) 08/13/2018 0907   MCV 95.2 08/27/2018 1706   MCV 96 08/13/2018 0907   MCH 32.7 08/27/2018 1706   MCHC 34.3 08/27/2018 1706   RDW 11.6 08/27/2018 1706   RDW 11.6 (L) 08/13/2018 0907   LYMPHSABS 1.6 08/13/2018 0907   EOSABS 0.1 08/13/2018 0907   BASOSABS 0.1 08/13/2018 0907   CMP     Component Value Date/Time   NA 134 (L) 08/27/2018 1706   NA 142 12/26/2017 0849   K 3.8 08/27/2018 1706   CL 103 08/27/2018 1706   CO2 24 08/27/2018 1706   GLUCOSE 118 (H) 08/27/2018 1706   BUN 18 08/27/2018 1706   BUN 29 (H) 12/26/2017 0849   CREATININE 0.88 08/27/2018 1706   CALCIUM 10.5 (H) 08/27/2018 1706   PROT 6.4 (L) 08/27/2018 1706   ALBUMIN 4.0 08/27/2018 1706   ALBUMIN 4.3 12/26/2017 0849   AST 26 08/27/2018 1706   ALT 15 08/27/2018 1706   ALKPHOS 109 08/27/2018 1706   BILITOT 0.8 08/27/2018 1706   GFRNONAA >60 08/27/2018 1706   GFRAA >60 08/27/2018 1706    Imaging Studies: Dg Cervical Spine Complete  Result Date: 08/13/2018 CLINICAL DATA:  Bilateral neck pain following a fall 2 days ago. EXAM: CERVICAL SPINE - COMPLETE 4+ VIEW COMPARISON:  None. FINDINGS: Multilevel facet degenerative changes. Minimal anterior spur formation at the C6-7 level. No prevertebral soft tissue swelling, fractures or subluxations. Mild foraminal stenosis on the left at the C5-6 level. IMPRESSION: 1. No fracture or subluxation. 2. Degenerative changes. Electronically Signed   By: Beckie Salts M.D.   On: 08/13/2018 13:57   Ct Abdomen Pelvis W Contrast  Result Date:  08/27/2018 CLINICAL DATA:  Right-sided abdominal pain for 3 days. EXAM: CT ABDOMEN AND PELVIS WITH CONTRAST TECHNIQUE: Multidetector CT imaging of the abdomen and pelvis was performed using the standard protocol following bolus administration of intravenous contrast. CONTRAST:  OMNIPAQUE IOHEXOL 300 MG/ML  SOLN COMPARISON:  None. FINDINGS: Lower chest: The lung bases are clear of an acute process. No pleural effusion or pulmonary lesions. The heart is normal in size. No pericardial effusion. Hepatobiliary: No focal hepatic lesions or intrahepatic biliary dilatation. The gallbladder appears normal. No common bile duct dilatation. Pancreas:  No mass, inflammation or ductal dilatation. Spleen: Normal size.  No focal lesions. Adrenals/Urinary Tract: The adrenal glands and kidneys are unremarkable. The bladder appears normal. Stomach/Bowel: The stomach is unremarkable. There is fairly marked inflammation involving the second portion of the duodenum consistent with significant duodenitis or peptic ulcer disease. No masses identified. No perforated ulcer. The third portion the duodenum appears normal. The small bowel and colon are unremarkable. No findings for obstruction or perforation. The terminal ileum and appendix are normal. Vascular/Lymphatic: Advanced atherosclerotic calcifications involving the aorta and iliac arteries. The major venous structures are patent. Small scattered mesenteric and retroperitoneal lymph nodes but no mass or overt adenopathy. Reproductive: Surgically absent. Other: No pelvic mass or adenopathy. No free pelvic fluid collections. No inguinal mass or adenopathy. No abdominal wall hernia or subcutaneous lesions. Musculoskeletal: No significant bony findings. Age related osteoporosis and degenerative changes involving the spine along with scoliosis. IMPRESSION: 1. CT findings consistent with significant duodenitis or peptic ulcer disease involving the second portion the duodenum. No  discrete ulcer or evidence for perforation. No obstructive findings. Adjacent small paraduodenal lymph nodes are noted. 2. No other significant abdominal/pelvic findings are identified. 3. Advanced atherosclerotic calcifications involving the aorta and iliac arteries but no focal aneurysm or dissection. Electronically Signed   By: Rudie Meyer M.D.   On: 08/27/2018 21:53   Dg Abd 2 Views  Result Date: 08/17/2018 CLINICAL DATA:  Lower abdominal pain and constipation for the past 2 weeks. EXAM: ABDOMEN - 2 VIEW COMPARISON:  None. FINDINGS: The bowel gas pattern is normal. Mild to moderate increased colonic stool burden. There is no evidence of free air. No radio-opaque calculi or other significant radiographic abnormality is seen. No acute osseous abnormality. Lumbar dextroscoliosis. IMPRESSION: 1. No acute findings. 2. Mild to moderate increased colonic stool burden. These results will be called to the ordering clinician or representative by the Radiology Department at the imaging location. Electronically Signed   By: Obie Dredge M.D.   On: 08/17/2018 17:14   Mm 3d Screen Breast Bilateral  Result Date: 08/13/2018 CLINICAL DATA:  Screening. EXAM: DIGITAL SCREENING BILATERAL MAMMOGRAM WITH TOMO AND CAD COMPARISON:  Previous exam(s). ACR Breast Density Category c: The breast tissue is heterogeneously dense, which may obscure small masses. FINDINGS: There are no findings suspicious for malignancy. Images were processed with CAD. IMPRESSION: No mammographic evidence of malignancy. A result letter of this screening mammogram will be mailed directly to the patient. RECOMMENDATION: Screening mammogram in one year. (Code:SM-B-01Y) BI-RADS CATEGORY  1: Negative. Electronically Signed   By: Frederico Hamman M.D.   On: 08/13/2018 10:33    Assessment and Plan:   Haydn Coogan is a 85 y.o. y/o female has been referred for abdominal pain with CT reporting significant duodenitis  I discussed the finding of  duodenitis seen on her CT scan I discussed that this could be due to underlying peptic ulcer disease, or also could be due to contraction the area at the time of the CT scan. She does not have any evidence of active GI bleeding Her hemoglobin was normal However, given her recent weight loss, and significant abdominal pain that led to an ER visit, we discussed that an EGD can help rule out peptic ulcer disease or mass in the area.  I have discussed alternative options, risks & benefits,  which include, but are not limited to, bleeding, infection, perforation,respiratory complication & drug reaction.  The patient agrees with this plan & written consent will be obtained.  I also discussed that her last colonoscopy was in 2004 and one polyp was removed.  Since we do not have the pathology results, we do not know if this was an adenoma polyp.  Even though she is above 55 years of age, colonoscopy for polyp surveillance does not have a definitive stopping age.  Screening colonoscopies are only recommended up till 83 years of age for average risk screening.  I discussed that if they are interested, we can proceed with a colonoscopy along with the EGD to evaluate for any other underlying polyps and remove them during the procedure.  However, patient and family are not interested in colonoscopy at this time and would just like to proceed with EGD.  This is reasonable.  They understand the risk of underlying malignancy or polyps if colonoscopy is not done and she was not to proceed with colonoscopy at this time.  Continue MiraLAX daily Continue high-fiber diet   Dr Karen Hooper  Speech recognition software was used to dictate the above note.

## 2018-09-07 ENCOUNTER — Encounter: Payer: Self-pay | Admitting: *Deleted

## 2018-09-07 ENCOUNTER — Encounter
Admission: RE | Disposition: A | Discharge status: Home / Self Care | Payer: Self-pay | Source: Home / Self Care | Attending: Gastroenterology

## 2018-09-07 ENCOUNTER — Ambulatory Visit: Payer: Medicare Other | Admitting: Certified Registered Nurse Anesthetist

## 2018-09-07 ENCOUNTER — Ambulatory Visit
Admission: RE | Admit: 2018-09-07 | Discharge: 2018-09-07 | Disposition: A | Discharge status: Home / Self Care | Payer: Medicare Other | Attending: Gastroenterology | Admitting: Gastroenterology

## 2018-09-07 DIAGNOSIS — R1013 Epigastric pain: Secondary | ICD-10-CM

## 2018-09-07 DIAGNOSIS — F419 Anxiety disorder, unspecified: Secondary | ICD-10-CM | POA: Diagnosis not present

## 2018-09-07 DIAGNOSIS — R933 Abnormal findings on diagnostic imaging of other parts of digestive tract: Secondary | ICD-10-CM

## 2018-09-07 DIAGNOSIS — Z79899 Other long term (current) drug therapy: Secondary | ICD-10-CM | POA: Insufficient documentation

## 2018-09-07 DIAGNOSIS — K269 Duodenal ulcer, unspecified as acute or chronic, without hemorrhage or perforation: Secondary | ICD-10-CM

## 2018-09-07 DIAGNOSIS — K259 Gastric ulcer, unspecified as acute or chronic, without hemorrhage or perforation: Secondary | ICD-10-CM | POA: Insufficient documentation

## 2018-09-07 DIAGNOSIS — I10 Essential (primary) hypertension: Secondary | ICD-10-CM | POA: Insufficient documentation

## 2018-09-07 DIAGNOSIS — E785 Hyperlipidemia, unspecified: Secondary | ICD-10-CM | POA: Diagnosis not present

## 2018-09-07 DIAGNOSIS — Z888 Allergy status to other drugs, medicaments and biological substances status: Secondary | ICD-10-CM | POA: Diagnosis not present

## 2018-09-07 DIAGNOSIS — K279 Peptic ulcer, site unspecified, unspecified as acute or chronic, without hemorrhage or perforation: Secondary | ICD-10-CM | POA: Diagnosis not present

## 2018-09-07 DIAGNOSIS — K2960 Other gastritis without bleeding: Secondary | ICD-10-CM | POA: Diagnosis not present

## 2018-09-07 DIAGNOSIS — K2950 Unspecified chronic gastritis without bleeding: Secondary | ICD-10-CM | POA: Diagnosis not present

## 2018-09-07 DIAGNOSIS — K253 Acute gastric ulcer without hemorrhage or perforation: Secondary | ICD-10-CM

## 2018-09-07 HISTORY — PX: ESOPHAGOGASTRODUODENOSCOPY (EGD) WITH PROPOFOL: SHX5813

## 2018-09-07 SURGERY — ESOPHAGOGASTRODUODENOSCOPY (EGD) WITH PROPOFOL
Anesthesia: General

## 2018-09-07 MED ORDER — LIDOCAINE HCL (CARDIAC) PF 100 MG/5ML IV SOSY
PREFILLED_SYRINGE | INTRAVENOUS | Status: DC | PRN
  Administered 2018-09-07: 50 mg via INTRAVENOUS

## 2018-09-07 MED ORDER — PROPOFOL 500 MG/50ML IV EMUL
INTRAVENOUS | Status: AC
  Filled 2018-09-07: qty 50

## 2018-09-07 MED ORDER — DEXAMETHASONE SODIUM PHOSPHATE 10 MG/ML IJ SOLN: INTRAMUSCULAR | Status: DC | PRN

## 2018-09-07 MED ORDER — PROPOFOL 10 MG/ML IV BOLUS
INTRAVENOUS | Status: DC | PRN
  Administered 2018-09-07: 50 mg via INTRAVENOUS

## 2018-09-07 MED ORDER — OMEPRAZOLE 20 MG PO CPDR: 20.0000 mg | DELAYED_RELEASE_CAPSULE | Freq: Two times a day (BID) | ORAL | 1 refills | Status: DC

## 2018-09-07 MED ORDER — SODIUM CHLORIDE 0.9 % IV SOLN
INTRAVENOUS | Status: DC
  Administered 2018-09-07: 1000 mL via INTRAVENOUS

## 2018-09-07 MED ORDER — PROPOFOL 500 MG/50ML IV EMUL
INTRAVENOUS | Status: DC | PRN
  Administered 2018-09-07: 175 ug/kg/min via INTRAVENOUS

## 2018-09-07 NOTE — Transfer of Care (Signed)
Immediate Anesthesia Transfer of Care Note  Patient: Karen Hooper  Procedure(s) Performed: ESOPHAGOGASTRODUODENOSCOPY (EGD) WITH PROPOFOL (N/A )  Patient Location: PACU  Anesthesia Type:General  Level of Consciousness: sedated  Airway & Oxygen Therapy: Patient Spontanous Breathing  Post-op Assessment: Report given to RN and Post -op Vital signs reviewed and stable  Post vital signs: Reviewed and stable  Last Vitals:  Vitals Value Taken Time  BP 124/50 09/07/2018 11:50 AM  Temp 36.7 C 09/07/2018 11:50 AM  Pulse 60 09/07/2018 11:50 AM  Resp 13 09/07/2018 11:50 AM  SpO2 99 % 09/07/2018 11:50 AM    Last Pain:  Vitals:   09/07/18 1150  TempSrc: Tympanic  PainSc: Asleep         Complications: No apparent anesthesia complications

## 2018-09-07 NOTE — Op Note (Signed)
[facility] Gastroenterology Patient Name: Karen Hooper Procedure Date: 09/07/2018 11:17 AM MRN: [id number] Account #: [id number] Date of Birth: May 03, 1932 Admit Type: Outpatient Age: 85 Room: [facility] ENDO ROOM 2 Gender: Female Note Status: Finalized Procedure:            Upper GI endoscopy Indications:          Generalized abdominal pain, Abnormal CT of the GI tract Providers:            Eshani Maestre B. Maximino Greenland MD, MD Medicines:            Monitored Anesthesia Care Complications:        No immediate complications. Procedure:            Pre-Anesthesia Assessment:                       - The risks and benefits of the procedure and the                        sedation options and risks were discussed with the                        patient. All questions were answered and informed                        consent was obtained.                       - Patient identification and proposed procedure were                        verified prior to the procedure.                       - ASA Grade Assessment: II - A patient with mild                        systemic disease.                       After obtaining informed consent, the endoscope was                        passed under direct vision. Throughout the procedure,                        the patient's blood pressure, pulse, and oxygen                        saturations were monitored continuously. The Endoscope                        was introduced through the mouth, and advanced to the                        third part of duodenum. The upper GI endoscopy was                        accomplished with ease. The patient tolerated the                        procedure well. Findings:  The gastroesophageal junction and examined esophagus were normal.      One non-bleeding gastric ulcer with a clean ulcer base (Forrest Class       III) was found in the gastric antrum. The lesion was 4 mm in largest       dimension. Biopsies were  taken with a cold forceps for histology.       Biopsies were taken with a cold forceps for Helicobacter pylori testing.      The exam of the stomach was otherwise normal.      One non-bleeding duodenal ulcer with a clean ulcer base (Forrest Class       III) was found in the duodenal bulb. The lesion was 6 mm in largest       dimension. Biopsies were taken with a cold forceps for histology. The       ulcer was present in the duodenal sweep, just past the duodenal bulb.       The biopsies were taken in proximity to the ulcer, and not of the ulcer       itself to prevent bleeding.      The exam of the duodenum was otherwise normal. Impression:           - Normal gastroesophageal junction and esophagus.                       - Non-bleeding gastric ulcer with a clean ulcer base                        (Forrest Class III). Biopsied.                       - One non-bleeding duodenal ulcer with a clean ulcer                        base (Forrest Class III). Biopsied.                       - No evidence of active or recent bleeding seen                        throughout the exam. Recommendation:       - Await pathology results.                       - Avoid NSAIDs except Aspirin if medically indicated by                        PCP                       - Take prescribed proton pump inhibitor or H2 blocker                        (antacid) medications 30 - 60 minutes before meals.                       - Return to my office in 4 weeks.                       - Continue present medications.                       -  Resume previous diet.                       - The findings and recommendations were discussed with                        the patient.                       - The findings and recommendations were discussed with                        the patient's family. Procedure Code(s):    --- Professional ---                       [phone], Esophagogastroduodenoscopy, flexible, transoral;                         with biopsy, single or multiple Diagnosis Code(s):    --- Professional ---                       K25.9, Gastric ulcer, unspecified as acute or chronic,                        without hemorrhage or perforation                       K26.9, Duodenal ulcer, unspecified as acute or chronic,                        without hemorrhage or perforation                       R10.84, Generalized abdominal pain                       R93.3, Abnormal findings on diagnostic imaging of other                        parts of digestive tract CPT copyright 2018 American Medical Association. All rights reserved. The codes documented in this report are preliminary and upon coder review may  be revised to meet current compliance requirements.  [name], MD Michel Bickers B. Maximino Greenland MD, MD 09/07/2018 11:50:02 AM This report has been signed electronically. Number of Addenda: 0 Note Initiated On: 09/07/2018 11:17 AM Estimated Blood Loss: Estimated blood loss: none.      [facility]

## 2018-09-07 NOTE — Anesthesia Post-op Follow-up Note (Signed)
Anesthesia QCDR form completed.        

## 2018-09-07 NOTE — Anesthesia Postprocedure Evaluation (Signed)
Anesthesia Post Note  Patient: Karen Hooper  Procedure(s) Performed: ESOPHAGOGASTRODUODENOSCOPY (EGD) WITH PROPOFOL (N/A )  Patient location during evaluation: Endoscopy Anesthesia Type: General Level of consciousness: awake and alert Pain management: pain level controlled Vital Signs Assessment: post-procedure vital signs reviewed and stable Respiratory status: spontaneous breathing, nonlabored ventilation, respiratory function stable and patient connected to nasal cannula oxygen Cardiovascular status: blood pressure returned to baseline and stable Postop Assessment: no apparent nausea or vomiting Anesthetic complications: no     Last Vitals:  Vitals:   09/07/18 1037 09/07/18 1150  BP: (!) 138/57 (!) 124/50  Pulse: 64 60  Resp: 16 13  Temp: 36.6 C 36.7 C  SpO2: 100% 99%    Last Pain:  Vitals:   09/07/18 1220  TempSrc:   PainSc: 0-No pain                 Lenard Simmer

## 2018-09-07 NOTE — Anesthesia Procedure Notes (Signed)
Date/Time: 09/07/2018 11:30 AM Performed by: Ginger Carne, CRNA Pre-anesthesia Checklist: Patient identified, Emergency Drugs available, Suction available, Patient being monitored and Timeout performed Patient Re-evaluated:Patient Re-evaluated prior to induction Oxygen Delivery Method: Nasal cannula Preoxygenation: Pre-oxygenation with 100% oxygen Induction Type: IV induction

## 2018-09-07 NOTE — Anesthesia Preprocedure Evaluation (Signed)
Anesthesia Evaluation  Patient identified by MRN, date of birth, ID band Patient awake    Reviewed: Allergy & Precautions, H&P , NPO status , Patient's Chart, lab work & pertinent test results, reviewed documented beta blocker date and time   History of Anesthesia Complications Negative for: history of anesthetic complications  Airway Mallampati: I  TM Distance: >3 FB Neck ROM: full    Dental  (+) Caps, Dental Advidsory Given, Teeth Intact   Pulmonary neg pulmonary ROS,           Cardiovascular Exercise Tolerance: Good hypertension, (-) angina(-) CAD, (-) Past MI, (-) Cardiac Stents and (-) CABG (-) dysrhythmias (-) Valvular Problems/Murmurs     Neuro/Psych Anxiety negative neurological ROS  negative psych ROS   GI/Hepatic negative GI ROS, Neg liver ROS,   Endo/Other  negative endocrine ROS  Renal/GU CRFRenal disease  negative genitourinary   Musculoskeletal   Abdominal   Peds  Hematology negative hematology ROS (+)   Anesthesia Other Findings Past Medical History: No date: Allergy No date: Anxiety No date: Hyperlipidemia No date: Hypertension   Reproductive/Obstetrics negative OB ROS                             Anesthesia Physical Anesthesia Plan  ASA: II  Anesthesia Plan: General   Post-op Pain Management:    Induction: Intravenous  PONV Risk Score and Plan: 3 and Propofol infusion and TIVA  Airway Management Planned: Nasal Cannula and Natural Airway  Additional Equipment:   Intra-op Plan:   Post-operative Plan:   Informed Consent: I have reviewed the patients History and Physical, chart, labs and discussed the procedure including the risks, benefits and alternatives for the proposed anesthesia with the patient or authorized representative who has indicated his/her understanding and acceptance.     Dental Advisory Given  Plan Discussed with: Anesthesiologist, CRNA  and Surgeon  Anesthesia Plan Comments:         Anesthesia Quick Evaluation

## 2018-09-07 NOTE — H&P (Signed)
Karen Bouillon, MD [address], [address], [place], [place], [address] [address], [place], [place], [address] Phone: [phone]  Fax: [phone]  Primary Care Physician:  Duanne Limerick, MD   Pre-Procedure History & Physical: HPI:  Karen Hooper is a 85 y.o. female is here for an EGD.   Past Medical History:  Diagnosis Date  . Allergy   . Anxiety   . Hyperlipidemia   . Hypertension     Past Surgical History:  Procedure Laterality Date  . CATARACT EXTRACTION, BILATERAL    . EYE SURGERY    . VAGINAL HYSTERECTOMY      Prior to Admission medications   Medication Sig Start Date End Date Taking? Authorizing Provider  famotidine (PEPCID) 20 MG tablet Take 1 tablet (20 mg total) by mouth 2 (two) times daily. 08/27/18 08/27/19 Yes McShane, Rudy Jew, MD  lisinopril (PRINIVIL,ZESTRIL) 10 MG tablet Take 1 tablet (10 mg total) by mouth daily. 08/13/18  Yes Duanne Limerick, MD  loratadine (CLARITIN) 10 MG tablet Take 1 tablet (10 mg total) by mouth daily. otc 06/27/18  Yes Duanne Limerick, MD  metoprolol succinate (TOPROL-XL) 50 MG 24 hr tablet TAKE (1) TABLET BY MOUTH EVERY DAY 06/27/18  Yes Duanne Limerick, MD  Omega 3 1000 MG CAPS Take 2 capsules (2,000 mg total) by mouth 2 (two) times daily. 06/27/18  Yes Duanne Limerick, MD  polyethylene glycol [facility] packet Take 17 g by mouth daily. 08/27/18  Yes Jeanmarie Plant, MD    Allergies as of 09/06/2018 - Review Complete 08/27/2018  Allergen Reaction Noted  . Atorvastatin  11/13/2014  . Etodolac  11/13/2014  . Ezetimibe  11/13/2014    Family History  Problem Relation Age of Onset  . Breast cancer Sister 3  . Breast cancer Daughter 29    Social History   Socioeconomic History  . Marital status: Married    Spouse name: Not on file  . Number of children: Not on file  . Years of education: Not on file  . Highest education level: Not on file  Occupational History  . Not on file  Social Needs  .  Financial resource strain: Not on file  . Food insecurity:    Worry: Not on file    Inability: Not on file  . Transportation needs:    Medical: Not on file    Non-medical: Not on file  Tobacco Use  . Smoking status: Never Smoker  . Smokeless tobacco: Never Used  Substance and Sexual Activity  . Alcohol use: No    Alcohol/week: 0.0 standard drinks  . Drug use: Never  . Sexual activity: Not Currently    Birth control/protection: Abstinence  Lifestyle  . Physical activity:    Days per week: Not on file    Minutes per session: Not on file  . Stress: Not on file  Relationships  . Social connections:    Talks on phone: Not on file    Gets together: Not on file    Attends religious service: Not on file    Active member of club or organization: Not on file    Attends meetings of clubs or organizations: Not on file    Relationship status: Not on file  . Intimate partner violence:    Fear of current or ex partner: Not on file    Emotionally abused: Not on file    Physically abused: Not on file    Forced sexual activity: Not  on file  Other Topics Concern  . Not on file  Social History Narrative  . Not on file    Review of Systems: See HPI, otherwise negative ROS  Physical Exam: BP (!) 138/57   Pulse 64   Temp 97.8 F (36.6 C) (Oral)   Resp 16   Ht 5\' 4"  (1.626 m)   Wt 68 kg   SpO2 100%   BMI 25.75 kg/m  General:   Alert,  pleasant and cooperative in NAD Head:  Normocephalic and atraumatic. Neck:  Supple; no masses or thyromegaly. Lungs:  Clear throughout to auscultation, normal respiratory effort.    Heart:  +S1, +S2, Regular rate and rhythm, No edema. Abdomen:  Soft, nontender and nondistended. Normal bowel sounds, without guarding, and without rebound.   Neurologic:  Alert and  oriented x4;  grossly normal neurologically.  Impression/Plan: Karen Hooper is here for an EGD for abdominal pain.  Risks, benefits, limitations, and alternatives regarding the  procedure have been reviewed with the patient.  Questions have been answered.  All parties agreeable.   Pasty Spillers, MD  09/07/2018, 11:13 AM

## 2018-09-11 ENCOUNTER — Encounter: Payer: Self-pay | Admitting: Gastroenterology

## 2018-09-11 LAB — SURGICAL PATHOLOGY

## 2018-09-13 ENCOUNTER — Telehealth: Payer: Self-pay | Admitting: Gastroenterology

## 2018-09-13 NOTE — Telephone Encounter (Signed)
Karen Hooper (daughter) called about patient's test results(upper endoscopy) Please call Karen Hooper  on her cell # with the results. Since the patient has fallen she doesn't remember that well.

## 2018-09-13 NOTE — Telephone Encounter (Signed)
[name] (daughter) called & wanted her to be called about

## 2018-09-14 NOTE — Telephone Encounter (Signed)
Daughter informed that her mother had no infection in her stomach. Also informed that letter was mailed.

## 2018-12-13 ENCOUNTER — Ambulatory Visit: Payer: Medicare Other | Admitting: Gastroenterology

## 2018-12-17 ENCOUNTER — Ambulatory Visit (INDEPENDENT_AMBULATORY_CARE_PROVIDER_SITE_OTHER): Payer: Medicare Other | Admitting: Gastroenterology

## 2018-12-17 ENCOUNTER — Telehealth: Payer: Self-pay

## 2018-12-17 ENCOUNTER — Encounter: Payer: Self-pay | Admitting: Gastroenterology

## 2018-12-17 ENCOUNTER — Other Ambulatory Visit: Payer: Self-pay

## 2018-12-17 DIAGNOSIS — K259 Gastric ulcer, unspecified as acute or chronic, without hemorrhage or perforation: Secondary | ICD-10-CM

## 2018-12-17 NOTE — Progress Notes (Signed)
Vonda Antigua, MD [address]  [place]  [place], [place] [address]  Main: [phone]  Fax: [phone]   Primary Care Physician: Juline Patch, MD  Virtual Visit via Telephone Note  I connected with patient on 12/17/18 at 11:00 AM EDT by telephone and verified that I am speaking with the correct person using two identifiers.   I discussed the limitations, risks, security and privacy concerns of performing an evaluation and management service by telephone and the availability of in person appointments. I also discussed with the patient that there may be a patient responsible charge related to this service. The patient expressed understanding and agreed to proceed.  Location of Patient: Home Location of Provider: Home Persons involved: Patient and provider only during the visit (nursing staff and front desk staff was involved in communicating with the patient prior to the appointment, reviewing medications and checking them in)   History of Present Illness: Chief Complaint  Patient presents with  . Follow-up    epigastric abdominal pain     HPI: Torianne Hooper is a 85 y.o. female previously seen in February 2020 after an ER visit for abdominal pain and CT reporting duodenitis.  Patient underwent EGD to evaluate the abnormal CT finding and a clean-based 6 mm duodenal bulb ulcer, and clean base 4 mm gastric antral ulcer was seen.  Biopsies were negative for H. pylori.  Patient was taking Advil prior to the upper endoscopy.  Denies any further NSAIDs since the procedure.  Is only using Tylenol as needed instead of NSAIDs.  The patient denies abdominal or flank pain, anorexia, nausea or vomiting, dysphagia, change in bowel habits or black or bloody stools or weight loss.  Patient took omeprazole twice daily for a total of 2 months and has completed the course  Current Outpatient Medications  Medication Sig Dispense Refill  . lisinopril (PRINIVIL,ZESTRIL) 10 MG  tablet Take 1 tablet (10 mg total) by mouth daily. 90 tablet 3  . loratadine (CLARITIN) 10 MG tablet Take 1 tablet (10 mg total) by mouth daily. otc 90 tablet 4  . metoprolol succinate (TOPROL-XL) 50 MG 24 hr tablet TAKE (1) TABLET BY MOUTH EVERY DAY 90 tablet 1  . Omega 3 1000 MG CAPS Take 2 capsules (2,000 mg total) by mouth 2 (two) times daily. 90 each 11  . polyethylene glycol (MIRALAX) packet Take 17 g by mouth daily. 14 each 0   No current facility-administered medications for this visit.     Allergies as of 12/17/2018 - Review Complete 12/17/2018  Allergen Reaction Noted  . Atorvastatin  11/13/2014  . Etodolac  11/13/2014  . Ezetimibe  11/13/2014    Review of Systems:    All systems reviewed and negative except where noted in HPI.   Observations/Objective:  Labs: CMP     Component Value Date/Time   NA 134 (L) 08/27/2018 1706   NA 142 12/26/2017 0849   K 3.8 08/27/2018 1706   CL 103 08/27/2018 1706   CO2 24 08/27/2018 1706   GLUCOSE 118 (H) 08/27/2018 1706   BUN 18 08/27/2018 1706   BUN 29 (H) 12/26/2017 0849   CREATININE 0.88 08/27/2018 1706   CALCIUM 10.5 (H) 08/27/2018 1706   PROT 6.4 (L) 08/27/2018 1706   ALBUMIN 4.0 08/27/2018 1706   ALBUMIN 4.3 12/26/2017 0849   AST 26 08/27/2018 1706   ALT 15 08/27/2018 1706   ALKPHOS 109 08/27/2018 1706   BILITOT 0.8 08/27/2018 1706   GFRNONAA >  60 08/27/2018 1706   GFRAA >60 08/27/2018 1706   Lab Results  Component Value Date   WBC 8.3 08/27/2018   HGB 13.0 08/27/2018   HCT 37.9 08/27/2018   MCV 95.2 08/27/2018   PLT 217 08/27/2018    Imaging Studies: No results found.  Assessment and Plan:   Karen Hooper is a 85 y.o. y/o female with history of NSAID induced duodenal bulb and gastric antrum ulcer in February 2020  Assessment and Plan: Patient has completed PPI therapy No abdominal pain or any other GI symptoms present at this time No alarm symptoms present Negative for H. pylori and biopsies   Patient strongly advised to avoid NSAIDs I gave her detailed instructions on avoiding NSAIDs, and over-the-counter medications that may have NSAIDs mixed in them.  Patient was able to write these instructions down and repeat them back to me.  She is only using Tylenol as needed sparingly instead of NSAIDs.  Patient has refused colonoscopy as detailed in previous notes.  Repeat upper endoscopy not indicated given biopsies were performed on previous procedure, and etiology of ulcers is known which was her NSAID use in the past.  Follow Up Instructions: Follow-up as needed   I discussed the assessment and treatment plan with the patient. The patient was provided an opportunity to ask questions and all were answered. The patient agreed with the plan and demonstrated an understanding of the instructions.   The patient was advised to call back or seek an in-person evaluation if the symptoms worsen or if the condition fails to improve as anticipated.  I provided 20  minutes of non-face-to-face time during this encounter. Additional time was spent in reviewing patient's chart, placing orders etc.   [name], MD  Speech recognition software was used to dictate this note.

## 2018-12-17 NOTE — Telephone Encounter (Signed)
error 

## 2018-12-31 ENCOUNTER — Encounter: Payer: Self-pay | Admitting: Family Medicine

## 2018-12-31 ENCOUNTER — Other Ambulatory Visit: Payer: Self-pay

## 2018-12-31 ENCOUNTER — Ambulatory Visit (INDEPENDENT_AMBULATORY_CARE_PROVIDER_SITE_OTHER): Payer: Medicare Other | Admitting: Family Medicine

## 2018-12-31 VITALS — BP 112/60 | HR 60 | Ht 64.0 in | Wt 153.0 lb

## 2018-12-31 DIAGNOSIS — Z780 Asymptomatic menopausal state: Secondary | ICD-10-CM | POA: Diagnosis not present

## 2018-12-31 DIAGNOSIS — E7849 Other hyperlipidemia: Secondary | ICD-10-CM

## 2018-12-31 DIAGNOSIS — I9589 Other hypotension: Secondary | ICD-10-CM | POA: Diagnosis not present

## 2018-12-31 DIAGNOSIS — G4709 Other insomnia: Secondary | ICD-10-CM

## 2018-12-31 DIAGNOSIS — I10 Essential (primary) hypertension: Secondary | ICD-10-CM

## 2018-12-31 DIAGNOSIS — F068 Other specified mental disorders due to known physiological condition: Secondary | ICD-10-CM

## 2018-12-31 DIAGNOSIS — R413 Other amnesia: Secondary | ICD-10-CM

## 2018-12-31 DIAGNOSIS — E861 Hypovolemia: Secondary | ICD-10-CM

## 2018-12-31 MED ORDER — LISINOPRIL 10 MG PO TABS: 10.0000 mg | ORAL_TABLET | Freq: Every day | ORAL | 3 refills | Status: DC

## 2018-12-31 MED ORDER — METOPROLOL SUCCINATE ER 50 MG PO TB24: ORAL_TABLET | ORAL | 1 refills | Status: DC

## 2018-12-31 MED ORDER — MIRTAZAPINE 7.5 MG PO TABS: 7.5000 mg | ORAL_TABLET | Freq: Every day | ORAL | 5 refills | Status: DC

## 2018-12-31 MED ORDER — OMEGA 3 1000 MG PO CAPS: 2.0000 | ORAL_CAPSULE | Freq: Two times a day (BID) | ORAL | Status: DC

## 2018-12-31 NOTE — Patient Instructions (Signed)

## 2018-12-31 NOTE — Progress Notes (Signed)
Date:  12/31/2018   Name:  Karen Hooper   DOB:  1932-02-28   MRN:  [id number]   Chief Complaint: Hypertension  Hypertension This is a chronic problem. The current episode started more than 1 year ago. The problem has been gradually improving since onset. The problem is controlled. Pertinent negatives include no anxiety, blurred vision, chest pain, headaches, malaise/fatigue, neck pain, orthopnea, palpitations, peripheral edema, PND, shortness of breath or sweats. There are no associated agents to hypertension. There are no known risk factors for coronary artery disease. Past treatments include nothing. The current treatment provides moderate improvement. There are no compliance problems.  There is no history of angina, kidney disease, CAD/MI, CVA, heart failure, left ventricular hypertrophy, PVD or retinopathy. There is no history of chronic renal disease, a hypertension causing med or renovascular disease.  Insomnia Primary symptoms: no fragmented sleep, no sleep disturbance, no difficulty falling asleep, no somnolence, no frequent awakening, premature morning awakening, no malaise/fatigue, no napping.   The current episode started more than one month. The problem has been waxing and waning (sometimes i sleep pretty good) since onset. Past treatments include nothing. The treatment provided mild relief. PMH includes: hypertension.  Hyperlipidemia This is a chronic problem. The current episode started more than 1 year ago. The problem is controlled. Recent lipid tests were reviewed and are normal. She has no history of chronic renal disease, diabetes, hypothyroidism, liver disease, obesity or nephrotic syndrome. There are no known factors aggravating her hyperlipidemia. Pertinent negatives include no chest pain, focal sensory loss, focal weakness, myalgias or shortness of breath. Treatments tried: omega 3. The current treatment provides moderate improvement of lipids. There are no compliance  problems.   Neurologic Problem The patient's pertinent negatives include no altered mental status, clumsiness, focal sensory loss, focal weakness, loss of balance, memory loss, near-syncope, slurred speech, syncope, visual change or weakness. Primary symptoms comment: mild confusion. This is a new problem. The current episode started more than 1 month ago. The problem has been waxing and waning since onset. Pertinent negatives include no abdominal pain, back pain, chest pain, dizziness, fatigue, fever, headaches, light-headedness, nausea, neck pain, palpitations, shortness of breath or vomiting. Past treatments include nothing. There is no history of liver disease.    Review of Systems  Constitutional: Negative.  Negative for chills, fatigue, fever, malaise/fatigue and unexpected weight change.  HENT: Negative for congestion, ear discharge, ear pain, rhinorrhea, sinus pressure, sneezing and sore throat.   Eyes: Negative for blurred vision, photophobia, pain, discharge, redness and itching.  Respiratory: Negative for cough, shortness of breath, wheezing and stridor.   Cardiovascular: Negative for chest pain, palpitations, orthopnea, PND and near-syncope.  Gastrointestinal: Negative for abdominal pain, blood in stool, constipation, diarrhea, nausea and vomiting.  Endocrine: Negative for cold intolerance, heat intolerance, polydipsia, polyphagia and polyuria.  Genitourinary: Negative for dysuria, flank pain, frequency, hematuria, menstrual problem, pelvic pain, urgency, vaginal bleeding and vaginal discharge.  Musculoskeletal: Negative for arthralgias, back pain, myalgias and neck pain.  Skin: Negative for rash.  Allergic/Immunologic: Negative for environmental allergies and food allergies.  Neurological: Negative for dizziness, focal weakness, syncope, weakness, light-headedness, numbness, headaches and loss of balance.  Hematological: Negative for adenopathy. Does not bruise/bleed easily.    Psychiatric/Behavioral: Negative for dysphoric mood, memory loss and sleep disturbance. The patient has insomnia. The patient is not nervous/anxious.     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   Allergic rhinitis 11/13/2014   Anxiety 11/13/2014   Essential hypertension 11/13/2014   Routine general medical examination at a health care facility 11/13/2014   Hot flash, menopausal 11/13/2014   Screening for depression 11/13/2014    Allergies  Allergen Reactions   Atorvastatin    Etodolac    Ezetimibe     Past Surgical History:  Procedure Laterality Date   CATARACT EXTRACTION, BILATERAL     ESOPHAGOGASTRODUODENOSCOPY (EGD) WITH PROPOFOL N/A 09/07/2018   Procedure: ESOPHAGOGASTRODUODENOSCOPY (EGD) WITH PROPOFOL;  Surgeon: Pasty Spillersahiliani, Varnita B, MD;  Location: [facility] ENDOSCOPY;  Service: Endoscopy;  Laterality: N/A;   EYE SURGERY     VAGINAL HYSTERECTOMY      Social History   Tobacco Use   Smoking status: Never Smoker   Smokeless tobacco: Never Used  Substance Use Topics   Alcohol use: No    Alcohol/week: 0.0 standard drinks   Drug use: Never     Medication list has been reviewed and updated.  Current Meds  Medication Sig   lisinopril (PRINIVIL,ZESTRIL) 10 MG tablet Take 1 tablet (10 mg total) by mouth daily.   metoprolol succinate (TOPROL-XL) 50 MG 24 hr tablet TAKE (1) TABLET BY MOUTH EVERY DAY   Omega 3 1000 MG CAPS Take 2 capsules (2,000 mg total) by mouth 2 (two) times daily.   polyethylene glycol (MIRALAX) packet Take 17 g by mouth daily.    PHQ 2/9 Scores 12/31/2018 12/31/2018 06/27/2018 12/26/2017  PHQ - 2 Score 0 0 0 0  PHQ- 9 Score 4 0 0 0    BP Readings from Last 3  Encounters:  12/31/18 112/60  09/07/18 (!) 124/50  09/06/18 (!) 107/59    Physical Exam Vitals signs and nursing note reviewed.  Constitutional:      General: She is not in acute distress.    Appearance: She is not diaphoretic.  HENT:     Head: Normocephalic and atraumatic.     Right Ear: Tympanic membrane, ear canal and external ear normal.     Left Ear: Tympanic membrane, ear canal and external ear normal.     Nose: Nose normal.     Mouth/Throat:     Mouth: Mucous membranes are moist.  Eyes:     General:        Right eye: No discharge.        Left eye: No discharge.     Conjunctiva/sclera: Conjunctivae normal.     Pupils: Pupils are equal, round, and reactive to light.  Neck:     Musculoskeletal: Normal range of motion and neck supple.     Thyroid: No thyromegaly.     Vascular: No JVD.  Cardiovascular:     Rate and Rhythm: Normal rate and regular rhythm.     Heart sounds: Normal heart sounds. No murmur. No friction rub. No gallop.   Pulmonary:     Effort: Pulmonary effort is normal. No respiratory distress.     Breath sounds: Normal breath sounds. No stridor. No wheezing, rhonchi or rales.  Abdominal:     General: Bowel sounds are normal.     Palpations: Abdomen is soft. There is no mass.     Tenderness: There is no abdominal tenderness. There is no guarding.  Musculoskeletal: Normal range of motion.  Lymphadenopathy:  Cervical: No cervical adenopathy.  Skin:    General: Skin is warm and dry.  Neurological:     Mental Status: She is alert.     Deep Tendon Reflexes: Reflexes are normal and symmetric.     Wt Readings from Last 3 Encounters:  12/31/18 153 lb (69.4 kg)  09/07/18 150 lb (68 kg)  09/06/18 152 lb 12.8 oz (69.3 kg)    BP 112/60    Pulse 60    Ht 5\' 4"  (1.626 m)    Wt 153 lb (69.4 kg)    BMI 26.26 kg/m   Assessment and Plan: 1. Hypotension due to hypovolemia This has resolved due to hydration  2. Essential hypertension Chronic.  Controlled.   Continue lisinopril 10 mg once a day metoprolol XL 50 mg once a day.  Patient's previous labs were reviewed and will repeat in 6 months. - lisinopril (ZESTRIL) 10 MG tablet; Take 1 tablet (10 mg total) by mouth daily.  Dispense: 90 tablet; Refill: 3 - metoprolol succinate (TOPROL-XL) 50 MG 24 hr tablet; TAKE (1) TABLET BY MOUTH EVERY DAY  Dispense: 90 tablet; Refill: 1  3. Familial multiple lipoprotein-type hyperlipidemia Chronic.  Controlled.  Continue omega-3 2 capsules daily. - Omega 3 1000 MG CAPS; Take 2 capsules (2,000 mg total) by mouth 2 (two) times daily.  4. Menopause Patient with history of menopause will undergo evaluation with bone density DEXA scan. - DG Bone Density; Future  5. Other insomnia New onset insomnia patient is actually having early morning awakening in the 4-5 o'clock range.  Patient will begin mirtazapine 7.5 mg nightly.                                                                                     6.mild memory disturbance not amounting to dementia-patient has some mild memory and dementia-like circumstances.  On her MiniCon she scored a 3.  Her PHQ 9 she scored a 4.  We believe that she may have some mild symptoms suggesting dementia but but not to the point of evaluation or medication.  Will observe and recheck in 6 months and if further progression on the mini con will refer to neurology.  This was discussed with daughter and she is in agreement with this approach.

## 2019-01-01 ENCOUNTER — Telehealth: Payer: Self-pay

## 2019-01-01 NOTE — Telephone Encounter (Signed)
Bone density in [place] July 1 @ 3:40- pt notified

## 2019-01-09 ENCOUNTER — Inpatient Hospital Stay: Admission: RE | Admit: 2019-01-09 | Payer: Medicare Other | Source: Ambulatory Visit

## 2019-01-24 ENCOUNTER — Ambulatory Visit
Admission: RE | Admit: 2019-01-24 | Discharge: 2019-01-24 | Disposition: A | Discharge status: Home / Self Care | Payer: Medicare Other | Source: Ambulatory Visit | Attending: Family Medicine | Admitting: Family Medicine

## 2019-01-24 ENCOUNTER — Other Ambulatory Visit: Payer: Self-pay

## 2019-01-24 DIAGNOSIS — M8589 Other specified disorders of bone density and structure, multiple sites: Secondary | ICD-10-CM | POA: Diagnosis not present

## 2019-01-24 DIAGNOSIS — Z780 Asymptomatic menopausal state: Secondary | ICD-10-CM | POA: Diagnosis not present

## 2019-01-28 ENCOUNTER — Other Ambulatory Visit: Payer: Self-pay

## 2019-01-28 DIAGNOSIS — G4709 Other insomnia: Secondary | ICD-10-CM

## 2019-01-28 MED ORDER — MIRTAZAPINE 15 MG PO TABS: 7.5000 mg | ORAL_TABLET | Freq: Every day | ORAL | 1 refills | Status: DC

## 2019-01-28 NOTE — Progress Notes (Unsigned)
Changed mirtazepine to 15mg  tablet, due to cost of 7.5mg  tabs- however, pt is still to only take 1/2= 7.5 mg qhs- daughter notified

## 2019-03-14 DIAGNOSIS — M1712 Unilateral primary osteoarthritis, left knee: Secondary | ICD-10-CM | POA: Diagnosis not present

## 2019-03-26 ENCOUNTER — Other Ambulatory Visit: Payer: Self-pay

## 2019-03-26 ENCOUNTER — Ambulatory Visit (INDEPENDENT_AMBULATORY_CARE_PROVIDER_SITE_OTHER): Payer: Medicare Other

## 2019-03-26 DIAGNOSIS — Z23 Encounter for immunization: Secondary | ICD-10-CM

## 2019-03-27 ENCOUNTER — Other Ambulatory Visit: Payer: Self-pay | Admitting: Family Medicine

## 2019-03-27 DIAGNOSIS — G4709 Other insomnia: Secondary | ICD-10-CM

## 2019-04-01 DIAGNOSIS — Z0120 Encounter for dental examination and cleaning without abnormal findings: Secondary | ICD-10-CM | POA: Diagnosis not present

## 2019-04-17 DIAGNOSIS — Z961 Presence of intraocular lens: Secondary | ICD-10-CM | POA: Diagnosis not present

## 2019-04-27 ENCOUNTER — Other Ambulatory Visit: Payer: Self-pay | Admitting: Family Medicine

## 2019-04-27 DIAGNOSIS — G4709 Other insomnia: Secondary | ICD-10-CM

## 2019-05-27 ENCOUNTER — Other Ambulatory Visit: Payer: Self-pay

## 2019-05-27 DIAGNOSIS — I10 Essential (primary) hypertension: Secondary | ICD-10-CM

## 2019-05-27 DIAGNOSIS — G4709 Other insomnia: Secondary | ICD-10-CM

## 2019-05-27 MED ORDER — MIRTAZAPINE 15 MG PO TABS: 7.5000 mg | ORAL_TABLET | Freq: Every day | ORAL | 0 refills | Status: DC

## 2019-05-27 MED ORDER — METOPROLOL SUCCINATE ER 50 MG PO TB24: ORAL_TABLET | ORAL | 0 refills | Status: DC

## 2019-05-27 NOTE — Progress Notes (Unsigned)
Sent in metoprolol and mirtazepine to [facility]

## 2019-06-26 ENCOUNTER — Other Ambulatory Visit: Payer: Self-pay | Admitting: Family Medicine

## 2019-06-26 DIAGNOSIS — G4709 Other insomnia: Secondary | ICD-10-CM

## 2019-07-03 ENCOUNTER — Other Ambulatory Visit: Payer: Self-pay

## 2019-07-03 ENCOUNTER — Encounter: Payer: Self-pay | Admitting: Family Medicine

## 2019-07-03 ENCOUNTER — Ambulatory Visit (INDEPENDENT_AMBULATORY_CARE_PROVIDER_SITE_OTHER): Payer: Medicare Other | Admitting: Family Medicine

## 2019-07-03 VITALS — BP 120/70 | HR 72 | Ht 64.0 in | Wt 151.0 lb

## 2019-07-03 DIAGNOSIS — G4709 Other insomnia: Secondary | ICD-10-CM

## 2019-07-03 DIAGNOSIS — I10 Essential (primary) hypertension: Secondary | ICD-10-CM | POA: Diagnosis not present

## 2019-07-03 DIAGNOSIS — E7849 Other hyperlipidemia: Secondary | ICD-10-CM

## 2019-07-03 MED ORDER — METOPROLOL SUCCINATE ER 50 MG PO TB24: ORAL_TABLET | ORAL | 1 refills | Status: DC

## 2019-07-03 MED ORDER — LISINOPRIL 10 MG PO TABS: 10.0000 mg | ORAL_TABLET | Freq: Every day | ORAL | 1 refills | Status: DC

## 2019-07-03 MED ORDER — MIRTAZAPINE 15 MG PO TABS: 7.5000 mg | ORAL_TABLET | Freq: Every day | ORAL | 5 refills | Status: DC

## 2019-07-03 NOTE — Progress Notes (Signed)
Date:  07/03/2019   Name:  Karen Hooper   DOB:  Oct 09, 1931   MRN:  [id number]   Chief Complaint: Hypertension and Insomnia  Hypertension This is a chronic problem. The current episode started more than 1 year ago. The problem has been gradually improving since onset. The problem is controlled. Pertinent negatives include no anxiety, blurred vision, chest pain, headaches, malaise/fatigue, neck pain, orthopnea, palpitations, peripheral edema, PND, shortness of breath or sweats. There are no associated agents to hypertension. Risk factors for coronary artery disease include dyslipidemia and post-menopausal state. Past treatments include beta blockers and ACE inhibitors. The current treatment provides moderate improvement. There are no compliance problems.  There is no history of angina, kidney disease, CAD/MI, CVA, heart failure, left ventricular hypertrophy, PVD or retinopathy. There is no history of chronic renal disease, a hypertension causing med or renovascular disease.  Insomnia Primary symptoms: no fragmented sleep, no sleep disturbance, no difficulty falling asleep, no somnolence, no frequent awakening, no premature morning awakening, no malaise/fatigue, no napping.   The current episode started more than one year. The problem occurs nightly. The problem has been gradually improving since onset. PMH includes: hypertension.    Lab Results  Component Value Date   CREATININE 0.88 08/27/2018   BUN 18 08/27/2018   NA 134 (L) 08/27/2018   K 3.8 08/27/2018   CL 103 08/27/2018   CO2 24 08/27/2018   Lab Results  Component Value Date   CHOL 202 (H) 12/26/2017   HDL 68 12/26/2017   LDLCALC 110 (H) 12/26/2017   TRIG 120 12/26/2017   CHOLHDL 3.0 12/26/2017   No results found for: TSH No results found for: HGBA1C   Review of Systems  Constitutional: Negative.  Negative for chills, fatigue, fever, malaise/fatigue and unexpected weight change.  HENT: Negative for congestion, ear  discharge, ear pain, postnasal drip, rhinorrhea, sinus pressure, sinus pain, sneezing, sore throat and tinnitus.   Eyes: Negative for blurred vision, photophobia, pain, discharge, redness and itching.  Respiratory: Negative for cough, shortness of breath, wheezing and stridor.   Cardiovascular: Negative for chest pain, palpitations, orthopnea and PND.  Gastrointestinal: Negative for abdominal pain, blood in stool, constipation, diarrhea, nausea and vomiting.  Endocrine: Negative for cold intolerance, heat intolerance, polydipsia, polyphagia and polyuria.  Genitourinary: Negative for dysuria, flank pain, frequency, hematuria, menstrual problem, pelvic pain, urgency, vaginal bleeding and vaginal discharge.  Musculoskeletal: Negative for arthralgias, back pain, myalgias and neck pain.  Skin: Negative for rash.  Allergic/Immunologic: Negative for environmental allergies and food allergies.  Neurological: Negative for dizziness, weakness, light-headedness, numbness and headaches.  Hematological: Negative for adenopathy. Does not bruise/bleed easily.  Psychiatric/Behavioral: Negative for dysphoric mood and sleep disturbance. The patient has insomnia. The patient is not nervous/anxious.     Patient Active Problem List   Diagnosis Date Noted  . Epigastric abdominal pain   . Acute peptic ulcer of stomach   . Postpyloric ulcer   . Abnormal CT scan, small bowel   . CKD (chronic kidney disease) stage 3, GFR 30-59 ml/min 06/25/2015  . 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 Allergic rhinitis 11/13/2014  . Anxiety 11/13/2014  . Essential hypertension 11/13/2014  . Routine general medical examination at a health care facility 11/13/2014  . Hot flash, menopausal 11/13/2014  . Screening for depression 11/13/2014    Allergies  Allergen Reactions  . Atorvastatin   . Etodolac   . Ezetimibe     Past Surgical History:  Procedure Laterality Date  .  CATARACT EXTRACTION,  BILATERAL    . ESOPHAGOGASTRODUODENOSCOPY (EGD) WITH PROPOFOL N/A 09/07/2018   Procedure: ESOPHAGOGASTRODUODENOSCOPY (EGD) WITH PROPOFOL;  Surgeon: Pasty Spillers, MD;  Location: [facility] ENDOSCOPY;  Service: Endoscopy;  Laterality: N/A;  . EYE SURGERY    . VAGINAL HYSTERECTOMY      Social History   Tobacco Use  . Smoking status: Never Smoker  . Smokeless tobacco: Never Used  Substance Use Topics  . Alcohol use: No    Alcohol/week: 0.0 standard drinks  . Drug use: Never     Medication list has been reviewed and updated.  Current Meds  Medication Sig  . lisinopril (ZESTRIL) 10 MG tablet Take 1 tablet (10 mg total) by mouth daily.  . metoprolol succinate (TOPROL-XL) 50 MG 24 hr tablet TAKE (1) TABLET BY MOUTH EVERY DAY  . mirtazapine (REMERON) 15 MG tablet TAKE 1/2 TABLET BY MOUTH AT BEDTIME  . Omega 3 1000 MG CAPS Take 2 capsules (2,000 mg total) by mouth 2 (two) times daily.  . polyethylene glycol (MIRALAX) packet Take 17 g by mouth daily.    PHQ 2/9 Scores 07/03/2019 12/31/2018 12/31/2018 06/27/2018  PHQ - 2 Score 0 0 0 0  PHQ- 9 Score 0 4 0 0    BP Readings from Last 3 Encounters:  07/03/19 120/70  12/31/18 112/60  09/07/18 (!) 124/50    Physical Exam  Wt Readings from Last 3 Encounters:  07/03/19 151 lb (68.5 kg)  12/31/18 153 lb (69.4 kg)  09/07/18 150 lb (68 kg)    BP 120/70   Pulse 72   Ht 5\' 4"  (1.626 m)   Wt 151 lb (68.5 kg)   BMI 25.92 kg/m   Assessment and Plan: 1. Essential hypertension Chronic.  Controlled.  Stable.  Continue lisinopril 10 mg once a day and metoprolol XL 50 mg once a day.  Will check CMP.  Recheck patient in 6 months. - lisinopril (ZESTRIL) 10 MG tablet; Take 1 tablet (10 mg total) by mouth daily.  Dispense: 90 tablet; Refill: 1 - metoprolol succinate (TOPROL-XL) 50 MG 24 hr tablet; TAKE (1) TABLET BY MOUTH EVERY DAY  Dispense: 90 tablet; Refill: 1 - Comprehensive Metabolic Panel (CMET)  2. Other insomnia Chronic.   Persistent.  Controlled.  Patient is controlled with initiation of sleep and resolution of fragmentation with mirtazapine 15 mg 1/2 tablet for a total of 7.5 mg nightly. - mirtazapine (REMERON) 15 MG tablet; Take 0.5 tablets (7.5 mg total) by mouth at bedtime.  Dispense: 15 tablet; Refill: 5  3. Familial multiple lipoprotein-type hyperlipidemia Chronic.  Controlled.  Stable.  Continue omega-3 over-the-counter.  Will recheck lipid panel. - Lipid Panel With LDL/HDL Ratio

## 2019-07-04 LAB — COMPREHENSIVE METABOLIC PANEL
ALT: 15 IU/L (ref 0–32)
AST: 29 IU/L (ref 0–40)
Albumin/Globulin Ratio: 2.4 — ABNORMAL HIGH (ref 1.2–2.2)
Albumin: 4 g/dL (ref 3.6–4.6)
Alkaline Phosphatase: 83 IU/L (ref 39–117)
BUN/Creatinine Ratio: 22 (ref 12–28)
BUN: 25 mg/dL (ref 8–27)
Bilirubin Total: 0.5 mg/dL (ref 0.0–1.2)
CO2: 21 mmol/L (ref 20–29)
Calcium: 10.9 mg/dL — ABNORMAL HIGH (ref 8.7–10.3)
Chloride: 105 mmol/L (ref 96–106)
Creatinine, Ser: 1.14 mg/dL — ABNORMAL HIGH (ref 0.57–1.00)
GFR calc Af Amer: 51 mL/min/{1.73_m2} — ABNORMAL LOW (ref >59)
GFR calc non Af Amer: 44 mL/min/{1.73_m2} — ABNORMAL LOW (ref >59)
Globulin, Total: 1.7 g/dL (ref 1.5–4.5)
Glucose: 97 mg/dL (ref 65–99)
Potassium: 4.6 mmol/L (ref 3.5–5.2)
Sodium: 142 mmol/L (ref 134–144)
Total Protein: 5.7 g/dL — ABNORMAL LOW (ref 6.0–8.5)

## 2019-07-04 LAB — LIPID PANEL WITH LDL/HDL RATIO
Cholesterol, Total: 180 mg/dL (ref 100–199)
HDL: 65 mg/dL (ref >39)
LDL Chol Calc (NIH): 95 mg/dL (ref 0–99)
LDL/HDL Ratio: 1.5 ratio (ref 0.0–3.2)
Triglycerides: 115 mg/dL (ref 0–149)
VLDL Cholesterol Cal: 20 mg/dL (ref 5–40)

## 2019-07-31 ENCOUNTER — Ambulatory Visit (INDEPENDENT_AMBULATORY_CARE_PROVIDER_SITE_OTHER): Payer: Medicare Other

## 2019-07-31 VITALS — Ht 64.0 in | Wt 150.0 lb

## 2019-07-31 DIAGNOSIS — Z1231 Encounter for screening mammogram for malignant neoplasm of breast: Secondary | ICD-10-CM

## 2019-07-31 DIAGNOSIS — Z0000 Encounter for general adult medical examination without abnormal findings: Secondary | ICD-10-CM | POA: Diagnosis not present

## 2019-07-31 NOTE — Progress Notes (Signed)
Subjective:   Karen Hooper is a 86 y.o. female who presents for an Initial Medicare Annual Wellness Visit.  Virtual Visit via Telephone Note  I connected with Karen Hooper on 07/31/19 at 11:20 AM EST by telephone and verified that I am speaking with the correct person using two identifiers.  Medicare Annual Wellness visit completed telephonically due to Covid-19 pandemic.   Location: Patient: home Provider: office   I discussed the limitations, risks, security and privacy concerns of performing an evaluation and management service by telephone and the availability of in person appointments. The patient expressed understanding and agreed to proceed.  Some vital signs may be absent or patient reported.   Karen Marker, LPN    Review of Systems      Cardiac Risk Factors include: advanced age (>74men, >43 women);hypertension     Objective:    Today's Vitals   07/31/19 1137  Weight: 150 lb (68 kg)  Height: 5\' 4"  (1.626 m)  PainSc: 5    Body mass index is 25.75 kg/m.  Advanced Directives 07/31/2019 09/07/2018 08/27/2018 02/17/2015 01/05/2015 12/23/2014  Does Patient Have a Medical Advance Directive? No No No Yes Yes Yes  Type of Advance Directive - - - Living will Living will Living will  Would patient like information on creating a medical advance directive? No - Patient declined - - - - -    Current Medications (verified) Outpatient Encounter Medications as of 07/31/2019  Medication Sig  . calcium carbonate (OS-CAL - DOSED IN MG OF ELEMENTAL CALCIUM) 1250 (500 Ca) MG tablet Take 1 tablet by mouth.  Marland Kitchen lisinopril (ZESTRIL) 10 MG tablet Take 1 tablet (10 mg total) by mouth daily.  . metoprolol succinate (TOPROL-XL) 50 MG 24 hr tablet TAKE (1) TABLET BY MOUTH EVERY DAY  . mirtazapine (REMERON) 15 MG tablet Take 0.5 tablets (7.5 mg total) by mouth at bedtime.  . Omega 3 1000 MG CAPS Take 2 capsules (2,000 mg total) by mouth 2 (two) times daily.  . polyethylene glycol  (MIRALAX) packet Take 17 g by mouth daily.   No facility-administered encounter medications on file as of 07/31/2019.    Allergies (verified) Atorvastatin, Etodolac, and Ezetimibe   History: Past Medical History:  Diagnosis Date  . Allergy   . Anxiety   . Hyperlipidemia   . Hypertension    Past Surgical History:  Procedure Laterality Date  . CATARACT EXTRACTION, BILATERAL    . ESOPHAGOGASTRODUODENOSCOPY (EGD) WITH PROPOFOL N/A 09/07/2018   Procedure: ESOPHAGOGASTRODUODENOSCOPY (EGD) WITH PROPOFOL;  Surgeon: Virgel Manifold, MD;  Location: [facility] ENDOSCOPY;  Service: Endoscopy;  Laterality: N/A;  . EYE SURGERY    . VAGINAL HYSTERECTOMY     Family History  Problem Relation Age of Onset  . Breast cancer Sister 2  . Breast cancer Daughter 18   Social History   Socioeconomic History  . Marital status: Married    Spouse name: Not on file  . Number of children: 2  . Years of education: Not on file  . Highest education level: Not on file  Occupational History  . Not on file  Tobacco Use  . Smoking status: Never Smoker  . Smokeless tobacco: Never Used  Substance and Sexual Activity  . Alcohol use: No    Alcohol/week: 0.0 standard drinks  . Drug use: Never  . Sexual activity: Not Currently    Birth control/protection: Abstinence  Other Topics Concern  . Not on file  Social History Narrative  . Not on  file   Social Determinants of Health   Financial Resource Strain: Low Risk   . Difficulty of Paying Living Expenses: Not hard at all  Food Insecurity: No Food Insecurity  . Worried About Programme researcher, broadcasting/film/video in the Last Year: Never true  . Ran Out of Food in the Last Year: Never true  Transportation Needs: No Transportation Needs  . Lack of Transportation (Medical): No  . Lack of Transportation (Non-Medical): No  Physical Activity: Inactive  . Days of Exercise per Week: 0 days  . Minutes of Exercise per Session: 0 min  Stress: No Stress Concern Present  . Feeling  of Stress : Not at all  Social Connections: Slightly Isolated  . Frequency of Communication with Friends and Family: More than three times a week  . Frequency of Social Gatherings with Friends and Family: More than three times a week  . Attends Religious Services: More than 4 times per year  . Active Member of Clubs or Organizations: No  . Attends Banker Meetings: Never  . Marital Status: Married    Tobacco Counseling Counseling given: Not Answered   Clinical Intake:  Pre-visit preparation completed: Yes  Pain : 0-10 Pain Score: 5  Pain Type: Chronic pain Pain Location: Knee Pain Orientation: Right Pain Descriptors / Indicators: Aching, Discomfort, Sore Pain Onset: More than a month ago Pain Frequency: Constant     BMI - recorded: 25.75 Nutritional Status: BMI 25 -29 Overweight Nutritional Risks: None Diabetes: No  How often do you need to have someone help you when you read instructions, pamphlets, or other written materials from your doctor or pharmacy?: 1 - Never  Interpreter Needed?: No  Information entered by :: Karen Littler LPN   Activities of Daily Living In your present state of health, do you have any difficulty performing the following activities: 07/31/2019  Hearing? N  Comment declines hearing aids  Vision? N  Difficulty concentrating or making decisions? N  Walking or climbing stairs? N  Dressing or bathing? N  Doing errands, shopping? N  Preparing Food and eating ? N  Using the Toilet? N  In the past six months, have you accidently leaked urine? N  Do you have problems with loss of bowel control? N  Managing your Medications? N  Managing your Finances? N  Housekeeping or managing your Housekeeping? N  Some recent data might be hidden     Immunizations and Health Maintenance Immunization History  Administered Date(s) Administered  . Fluad Quad(high Dose 65+) 03/26/2019  . Influenza, High Dose Seasonal PF 04/10/2017, 03/30/2018    . Influenza, Seasonal, Injecte, Preservative Fre 04/27/2011  . Influenza,inj,Quad PF,6+ Mos 04/12/2013, 04/14/2014, 04/21/2015, 06/23/2016  . Influenza-Unspecified 04/14/2014  . PFIZER SARS-COV-2 Vaccination 07/29/2019  . Pneumococcal Conjugate-13 12/23/2015  . Pneumococcal Polysaccharide-23 04/08/2011   There are no preventive care reminders to display for this patient.  Patient Care Team: Duanne Limerick, MD as PCP - General (Family Medicine)  Indicate any recent Medical Services you may have received from other than [facility] providers in the past year (date may be approximate).     Assessment:   This is a routine wellness examination for Karen Hooper.  Hearing/Vision screen  Hearing Screening   125Hz  250Hz  500Hz  1000Hz  2000Hz  3000Hz  4000Hz  6000Hz  8000Hz   Right ear:           Left ear:           Comments: Pt denies hearing difficulty  Vision Screening Comments: Annual vision screenings  at [facility] Dr. Melanie Crazier  Dietary issues and exercise activities discussed: Current Exercise Habits: The patient does not participate in regular exercise at present, Exercise limited by: orthopedic condition(s)  Goals   None    Depression Screen PHQ 2/9 Scores 07/31/2019 07/03/2019 12/31/2018 12/31/2018 06/27/2018 12/26/2017 12/22/2016  PHQ - 2 Score 0 0 0 0 0 0 0  PHQ- 9 Score - 0 4 0 0 0 -    Fall Risk Fall Risk  07/31/2019 12/26/2017 12/22/2016 12/23/2015 08/06/2015  Falls in the past year? 0 No No No No  Number falls in past yr: 0 - - - -  Injury with Fall? 0 - - - -  Risk for fall due to : Impaired balance/gait - - - -  Follow up Falls prevention discussed - - - -    FALL RISK PREVENTION PERTAINING TO THE HOME:  Any stairs in or around the home? Yes  If so, do they handrails? Yes   Home free of loose throw rugs in walkways, pet beds, electrical cords, etc? Yes  Adequate lighting in your home to reduce risk of falls? Yes   ASSISTIVE DEVICES UTILIZED TO PREVENT FALLS:  Life  alert? No  Use of a cane, walker or w/c? Yes  Grab bars in the bathroom? No  Shower chair or bench in shower? No  Elevated toilet seat or a handicapped toilet? No   DME ORDERS:  DME order needed?  No   TIMED UP AND GO:  Was the test performed? No . Telephonic visit,   Education: Fall risk prevention has been discussed.  Intervention(s) required? No   Cognitive Function:     6CIT Screen 07/31/2019  What Year? 0 points  What month? 0 points  What time? 0 points  Count back from 20 0 points  Months in reverse 2 points  Repeat phrase 4 points  Total Score 6    Screening Tests Health Maintenance  Topic Date Due  . TETANUS/TDAP  07/02/2020 (Originally 03/13/1951)  . INFLUENZA VACCINE  Completed  . DEXA SCAN  Completed  . PNA vac Low Risk Adult  Completed    Qualifies for Shingles Vaccine? Yes . Due for Shingrix. Education has been provided regarding the importance of this vaccine. Pt has been advised to call insurance company to determine out of pocket expense. Advised may also receive vaccine at local pharmacy or Health Dept. Verbalized acceptance and understanding.  Tdap: Although this vaccine is not a covered service during a Wellness Exam, does the patient still wish to receive this vaccine today?  No .  Education has been provided regarding the importance of this vaccine. Advised may receive this vaccine at local pharmacy or Health Dept. Aware to provide a copy of the vaccination record if obtained from local pharmacy or Health Dept. Verbalized acceptance and understanding.  Flu Vaccine: Up to date  Pneumococcal Vaccine: Up to date  Cancer Screenings:  Colorectal Screening: Completed 04/22/10. No longer required.   Mammogram: Completed 08/13/18. Repeat every year. Ordered today. Pt provided with contact information and advised to call to schedule appt.   Bone Density: Completed 01/24/19. Results reflect  OSTEOPENIA. Repeat every 2 years.   Lung Cancer Screening: (Low  Dose CT Chest recommended if Age 11-80 years, 30 pack-year currently smoking OR have quit w/in 15years.) does not qualify.   Additional Screening:  Hepatitis C Screening: no longer required  Vision Screening: Recommended annual ophthalmology exams for early detection of glaucoma and other disorders of  the eye. Is the patient up to date with their annual eye exam?  Yes  Who is the provider or what is the name of the office in which the pt attends annual eye exams? [facility]  Dental Screening: Recommended annual dental exams for proper oral hygiene  Community Resource Referral:  CRR required this visit?  No     Plan:    I have personally reviewed and addressed the Medicare Annual Wellness questionnaire and have noted the following in the patient's chart:  A. Medical and social history B. Use of alcohol, tobacco or illicit drugs  C. Current medications and supplements D. Functional ability and status E.  Nutritional status F.  Physical activity G. Advance directives H. List of other physicians I.  Hospitalizations, surgeries, and ER visits in previous 12 months J.  Vitals K. Screenings such as hearing and vision if needed, cognitive and depression L. Referrals and appointments   In addition, I have reviewed and discussed with patient certain preventive protocols, quality metrics, and best practice recommendations. A written personalized care plan for preventive services as well as general preventive health recommendations were provided to patient.   Signed,  Karen Littler, LPN Nurse Health Advisor   Nurse Notes: none

## 2019-07-31 NOTE — Patient Instructions (Signed)
Karen Hooper , Thank you for taking time to come for your Medicare Wellness Visit. I appreciate your ongoing commitment to your health goals. Please review the following plan we discussed and let me know if I can assist you in the future.   Screening recommendations/referrals: Colonoscopy: no longer required Mammogram: done 08/13/18. Please call [phone] to schedule your mammogram.  Bone Density: done 01/24/19 Recommended yearly ophthalmology/optometry visit for glaucoma screening and checkup Recommended yearly dental visit for hygiene and checkup  Vaccinations: Influenza vaccine: done 03/26/19 Pneumococcal vaccine: done 12/23/15 Tdap vaccine: due Shingles vaccine: Shingrix discussed. Please contact your pharmacy for coverage information.   Advanced directives: Advance directive discussed with you today. Even though you declined this today please call our office should you change your mind and we can give you the proper paperwork for you to fill out.  Conditions/risks identified: Recommend drinking 6-8 glasses of water per day  Next appointment: Please follow up in one year for your Medicare Annual Wellness visit.     Preventive Care 84 Years and Older, Female Preventive care refers to lifestyle choices and visits with your health care provider that can promote health and wellness. What does preventive care include?  A yearly physical exam. This is also called an annual well check.  Dental exams once or twice a year.  Routine eye exams. Ask your health care provider how often you should have your eyes checked.  Personal lifestyle choices, including:  Daily care of your teeth and gums.  Regular physical activity.  Eating a healthy diet.  Avoiding tobacco and drug use.  Limiting alcohol use.  Practicing safe sex.  Taking low-dose aspirin every day.  Taking vitamin and mineral supplements as recommended by your health care provider. What happens during an annual well  check? The services and screenings done by your health care provider during your annual well check will depend on your age, overall health, lifestyle risk factors, and family history of disease. Counseling  Your health care provider may ask you questions about your:  Alcohol use.  Tobacco use.  Drug use.  Emotional well-being.  Home and relationship well-being.  Sexual activity.  Eating habits.  History of falls.  Memory and ability to understand (cognition).  Work and work Astronomer.  Reproductive health. Screening  You may have the following tests or measurements:  Height, weight, and BMI.  Blood pressure.  Lipid and cholesterol levels. These may be checked every 5 years, or more frequently if you are over 84 years old.  Skin check.  Lung cancer screening. You may have this screening every year starting at age 84 if you have a 30-pack-year history of smoking and currently smoke or have quit within the past 15 years.  Fecal occult blood test (FOBT) of the stool. You may have this test every year starting at age 84.  Flexible sigmoidoscopy or colonoscopy. You may have a sigmoidoscopy every 5 years or a colonoscopy every 10 years starting at age 84.  Hepatitis C blood test.  Hepatitis B blood test.  Sexually transmitted disease (STD) testing.  Diabetes screening. This is done by checking your blood sugar (glucose) after you have not eaten for a while (fasting). You may have this done every 1-3 years.  Bone density scan. This is done to screen for osteoporosis. You may have this done starting at age 84.  Mammogram. This may be done every 1-2 years. Talk to your health care provider about how often you should have regular mammograms. Talk  with your health care provider about your test results, treatment options, and if necessary, the need for more tests. Vaccines  Your health care provider may recommend certain vaccines, such as:  Influenza vaccine. This is  recommended every year.  Tetanus, diphtheria, and acellular pertussis (Tdap, Td) vaccine. You may need a Td booster every 10 years.  Zoster vaccine. You may need this after age 84.  Pneumococcal 13-valent conjugate (PCV13) vaccine. One dose is recommended after age 84.  Pneumococcal polysaccharide (PPSV23) vaccine. One dose is recommended after age 84. Talk to your health care provider about which screenings and vaccines you need and how often you need them. This information is not intended to replace advice given to you by your health care provider. Make sure you discuss any questions you have with your health care provider. Document Released: 07/24/2015 Document Revised: 03/16/2016 Document Reviewed: 04/28/2015 Elsevier Interactive Patient Education  2017 [place] Prevention in the Home Falls can cause injuries. They can happen to people of all ages. There are many things you can do to make your home safe and to help prevent falls. What can I do on the outside of my home?  Regularly fix the edges of walkways and driveways and fix any cracks.  Remove anything that might make you trip as you walk through a door, such as a raised step or threshold.  Trim any bushes or trees on the path to your home.  Use bright outdoor lighting.  Clear any walking paths of anything that might make someone trip, such as rocks or tools.  Regularly check to see if handrails are loose or broken. Make sure that both sides of any steps have handrails.  Any raised decks and porches should have guardrails on the edges.  Have any leaves, snow, or ice cleared regularly.  Use sand or salt on walking paths during winter.  Clean up any spills in your garage right away. This includes oil or grease spills. What can I do in the bathroom?  Use night lights.  Install grab bars by the toilet and in the tub and shower. Do not use towel bars as grab bars.  Use non-skid mats or decals in the tub or  shower.  If you need to sit down in the shower, use a plastic, non-slip stool.  Keep the floor dry. Clean up any water that spills on the floor as soon as it happens.  Remove soap buildup in the tub or shower regularly.  Attach bath mats securely with double-sided non-slip rug tape.  Do not have throw rugs and other things on the floor that can make you trip. What can I do in the bedroom?  Use night lights.  Make sure that you have a light by your bed that is easy to reach.  Do not use any sheets or blankets that are too big for your bed. They should not hang down onto the floor.  Have a firm chair that has side arms. You can use this for support while you get dressed.  Do not have throw rugs and other things on the floor that can make you trip. What can I do in the kitchen?  Clean up any spills right away.  Avoid walking on wet floors.  Keep items that you use a lot in easy-to-reach places.  If you need to reach something above you, use a strong step stool that has a grab bar.  Keep electrical cords out of the way.  Do  not use floor polish or wax that makes floors slippery. If you must use wax, use non-skid floor wax.  Do not have throw rugs and other things on the floor that can make you trip. What can I do with my stairs?  Do not leave any items on the stairs.  Make sure that there are handrails on both sides of the stairs and use them. Fix handrails that are broken or loose. Make sure that handrails are as long as the stairways.  Check any carpeting to make sure that it is firmly attached to the stairs. Fix any carpet that is loose or worn.  Avoid having throw rugs at the top or bottom of the stairs. If you do have throw rugs, attach them to the floor with carpet tape.  Make sure that you have a light switch at the top of the stairs and the bottom of the stairs. If you do not have them, ask someone to add them for you. What else can I do to help prevent  falls?  Wear shoes that:  Do not have high heels.  Have rubber bottoms.  Are comfortable and fit you well.  Are closed at the toe. Do not wear sandals.  If you use a stepladder:  Make sure that it is fully opened. Do not climb a closed stepladder.  Make sure that both sides of the stepladder are locked into place.  Ask someone to hold it for you, if possible.  Clearly mark and make sure that you can see:  Any grab bars or handrails.  First and last steps.  Where the edge of each step is.  Use tools that help you move around (mobility aids) if they are needed. These include:  Canes.  Walkers.  Scooters.  Crutches.  Turn on the lights when you go into a dark area. Replace any light bulbs as soon as they burn out.  Set up your furniture so you have a clear path. Avoid moving your furniture around.  If any of your floors are uneven, fix them.  If there are any pets around you, be aware of where they are.  Review your medicines with your doctor. Some medicines can make you feel dizzy. This can increase your chance of falling. Ask your doctor what other things that you can do to help prevent falls. This information is not intended to replace advice given to you by your health care provider. Make sure you discuss any questions you have with your health care provider. Document Released: 04/23/2009 Document Revised: 12/03/2015 Document Reviewed: 08/01/2014 Elsevier Interactive Patient Education  2017 [employer].

## 2019-09-19 ENCOUNTER — Ambulatory Visit: Payer: Medicare Other | Admitting: Family Medicine

## 2019-09-19 ENCOUNTER — Other Ambulatory Visit: Payer: Self-pay

## 2019-09-19 ENCOUNTER — Encounter: Payer: Self-pay | Admitting: Family Medicine

## 2019-09-19 VITALS — BP 124/80 | HR 64 | Ht 64.0 in | Wt 157.0 lb

## 2019-09-19 DIAGNOSIS — S8011XA Contusion of right lower leg, initial encounter: Secondary | ICD-10-CM

## 2019-09-19 NOTE — Progress Notes (Signed)
Date:  09/19/2019   Name:  Karen Hooper   DOB:  12/22/1931   MRN:  [id number]   Chief Complaint: Leg Pain (bumped leg on chair x 4 weeks ago- black and has a bubbled up area)  Leg Pain  The incident occurred more than 1 week ago (contusion leg injury 4 weeks ago). The incident occurred at home. The injury mechanism was a direct blow. The pain is present in the right leg. The pain is at a severity of 0/10. The patient is experiencing no pain. Pertinent negatives include no inability to bear weight, loss of motion, loss of sensation, muscle weakness, numbness or tingling. She reports no foreign bodies present. Nothing aggravates the symptoms. She has tried nothing for the symptoms.    Lab Results  Component Value Date   CREATININE 1.14 (H) 07/03/2019   BUN 25 07/03/2019   NA 142 07/03/2019   K 4.6 07/03/2019   CL 105 07/03/2019   CO2 21 07/03/2019   Lab Results  Component Value Date   CHOL 180 07/03/2019   HDL 65 07/03/2019   LDLCALC 95 07/03/2019   TRIG 115 07/03/2019   CHOLHDL 3.0 12/26/2017   No results found for: TSH No results found for: HGBA1C   Review of Systems  Constitutional: Negative.  Negative for chills, fatigue, fever and unexpected weight change.  HENT: Negative for congestion, ear discharge, ear pain, rhinorrhea, sinus pressure, sneezing and sore throat.   Eyes: Negative for photophobia, pain, discharge, redness and itching.  Respiratory: Negative for cough, shortness of breath, wheezing and stridor.   Gastrointestinal: Negative for abdominal pain, blood in stool, constipation, diarrhea, nausea and vomiting.  Endocrine: Negative for cold intolerance, heat intolerance, polydipsia, polyphagia and polyuria.  Genitourinary: Negative for dysuria, flank pain, frequency, hematuria, menstrual problem, pelvic pain, urgency, vaginal bleeding and vaginal discharge.  Musculoskeletal: Negative for arthralgias, back pain and myalgias.  Skin: Negative for rash.    Allergic/Immunologic: Negative for environmental allergies and food allergies.  Neurological: Negative for dizziness, tingling, weakness, light-headedness, numbness and headaches.  Hematological: Negative for adenopathy. Does not bruise/bleed easily.  Psychiatric/Behavioral: Negative for dysphoric mood. The patient is not nervous/anxious.     Patient Active Problem List   Diagnosis Date Noted  . Epigastric abdominal pain   . Acute peptic ulcer of stomach   . Postpyloric ulcer   . Abnormal CT scan, small bowel   . CKD (chronic kidney disease) stage 3, GFR 30-59 ml/min 06/25/2015  . 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 Allergic rhinitis 11/13/2014  . Anxiety 11/13/2014  . Essential hypertension 11/13/2014  . Routine general medical examination at a health care facility 11/13/2014  . Hot flash, menopausal 11/13/2014  . Screening for depression 11/13/2014    Allergies  Allergen Reactions  . Atorvastatin   . Etodolac   . Ezetimibe     Past Surgical History:  Procedure Laterality Date  . CATARACT EXTRACTION, BILATERAL    . ESOPHAGOGASTRODUODENOSCOPY (EGD) WITH PROPOFOL N/A 09/07/2018   Procedure: ESOPHAGOGASTRODUODENOSCOPY (EGD) WITH PROPOFOL;  Surgeon: Pasty Spillers, MD;  Location: [facility] ENDOSCOPY;  Service: Endoscopy;  Laterality: N/A;  . EYE SURGERY    . VAGINAL HYSTERECTOMY      Social History   Tobacco Use  . Smoking status: Never Smoker  . Smokeless tobacco: Never Used  Substance Use Topics  . Alcohol use: No    Alcohol/week: 0.0 standard drinks  . Drug use: Never     Medication list has been  reviewed and updated.  Current Meds  Medication Sig  . calcium carbonate (OS-CAL - DOSED IN MG OF ELEMENTAL CALCIUM) 1250 (500 Ca) MG tablet Take 1 tablet by mouth.  Marland Kitchen lisinopril (ZESTRIL) 10 MG tablet Take 1 tablet (10 mg total) by mouth daily.  . metoprolol succinate (TOPROL-XL) 50 MG 24 hr tablet TAKE (1) TABLET BY MOUTH EVERY DAY  .  mirtazapine (REMERON) 15 MG tablet Take 0.5 tablets (7.5 mg total) by mouth at bedtime.  . Omega 3 1000 MG CAPS Take 2 capsules (2,000 mg total) by mouth 2 (two) times daily.  . polyethylene glycol (MIRALAX) packet Take 17 g by mouth daily.    PHQ 2/9 Scores 07/31/2019 07/03/2019 12/31/2018 12/31/2018  PHQ - 2 Score 0 0 0 0  PHQ- 9 Score - 0 4 0    BP Readings from Last 3 Encounters:  09/19/19 124/80  07/03/19 120/70  12/31/18 112/60    Physical Exam Vitals and nursing note reviewed.  Cardiovascular:     Heart sounds: S1 normal and S2 normal. Heart sounds not distant. No murmur. No systolic murmur. No diastolic murmur. No friction rub. No gallop. No S3 or S4 sounds.   Pulmonary:     Breath sounds: Normal breath sounds. No decreased breath sounds, wheezing, rhonchi or rales.     Wt Readings from Last 3 Encounters:  09/19/19 157 lb (71.2 kg)  07/31/19 150 lb (68 kg)  07/03/19 151 lb (68.5 kg)    BP 124/80   Pulse 64   Ht 5\' 4"  (1.626 m)   Wt 157 lb (71.2 kg)   BMI 26.95 kg/m   Assessment and Plan:  1. Contusion of right lower leg, initial encounter Acute.  Persistent.  Uncontrolled.  Relatively stable.  There is no surrounding cellulitis.  This was a hematoma that was on top of a hematoma the top of had some mild fluid and the bottom hematoma was firm and gelatinous.  Area was removed unroofed and the hematoma was removed area was cleaned irrigated and wet-to-dry dressing was initiated.  We will see patient back tomorrow for redressing and put in a consult to wound care for evaluation doing.  Procedure: Area was prepped with Betadine and superficial hematoma was removed with mild fluid face and second contusion area was removed that was gelatinous.  Area was gently irrigated with saline.  Wet-to-dry was applied and patient will return tomorrow we will likely redressed with iodoform gauze if available from urgent care.  Will render a Tdap when patient returns as well And that this  is an open wound and patient needs coverage.

## 2019-09-20 ENCOUNTER — Other Ambulatory Visit: Payer: Self-pay

## 2019-09-20 ENCOUNTER — Ambulatory Visit: Payer: Medicare Other | Admitting: Family Medicine

## 2019-09-20 ENCOUNTER — Encounter: Payer: Self-pay | Admitting: Family Medicine

## 2019-09-20 VITALS — BP 120/64 | HR 68 | Ht 64.0 in | Wt 154.0 lb

## 2019-09-20 DIAGNOSIS — Z23 Encounter for immunization: Secondary | ICD-10-CM

## 2019-09-20 DIAGNOSIS — Z4889 Encounter for other specified surgical aftercare: Secondary | ICD-10-CM | POA: Diagnosis not present

## 2019-09-20 MED ORDER — AMOXICILLIN-POT CLAVULANATE 875-125 MG PO TABS: 1.0000 | ORAL_TABLET | Freq: Two times a day (BID) | ORAL | 0 refills | Status: DC

## 2019-09-20 NOTE — Progress Notes (Signed)
Date:  09/20/2019   Name:  Karen Hooper   DOB:  [birth year]   MRN:  [id number]   Chief Complaint: Follow-up (wound on R) leg)  Patient is a 86 year old female who presents for a wound care exam exam. The patient reports the following problems: contusion with deep organized hematoma. Health maintenance has been reviewed needs tdap.   Lab Results  Component Value Date   CREATININE 1.14 (H) 07/03/2019   BUN 25 07/03/2019   NA 142 07/03/2019   K 4.6 07/03/2019   CL 105 07/03/2019   CO2 21 07/03/2019   Lab Results  Component Value Date   CHOL 180 07/03/2019   HDL 65 07/03/2019   LDLCALC 95 07/03/2019   TRIG 115 07/03/2019   CHOLHDL 3.0 12/26/2017   No results found for: TSH No results found for: HGBA1C   Review of Systems  Constitutional: Negative.  Negative for chills, fatigue, fever and unexpected weight change.  HENT: Negative for congestion, ear discharge, ear pain, rhinorrhea, sinus pressure, sneezing and sore throat.   Eyes: Negative for photophobia, pain, discharge, redness and itching.  Respiratory: Negative for cough, shortness of breath, wheezing and stridor.   Gastrointestinal: Negative for abdominal pain, blood in stool, constipation, diarrhea, nausea and vomiting.  Endocrine: Negative for cold intolerance, heat intolerance, polydipsia, polyphagia and polyuria.  Genitourinary: Negative for dysuria, flank pain, frequency, hematuria, menstrual problem, pelvic pain, urgency, vaginal bleeding and vaginal discharge.  Musculoskeletal: Negative for arthralgias, back pain and myalgias.  Skin: Negative for rash.  Allergic/Immunologic: Negative for environmental allergies and food allergies.  Neurological: Negative for dizziness, weakness, light-headedness, numbness and headaches.  Hematological: Negative for adenopathy. Does not bruise/bleed easily.  Psychiatric/Behavioral: Negative for dysphoric mood. The patient is not nervous/anxious.     Patient Active Problem  List   Diagnosis Date Noted  . Epigastric abdominal pain   . Acute peptic ulcer of stomach   . Postpyloric ulcer   . Abnormal CT scan, small bowel   . CKD (chronic kidney disease) stage 3, GFR 30-59 ml/min 06/25/2015  . 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 Allergic rhinitis 11/13/2014  . Anxiety 11/13/2014  . Essential hypertension 11/13/2014  . Routine general medical examination at a health care facility 11/13/2014  . Hot flash, menopausal 11/13/2014  . Screening for depression 11/13/2014    Allergies  Allergen Reactions  . Atorvastatin   . Etodolac   . Ezetimibe     Past Surgical History:  Procedure Laterality Date  . CATARACT EXTRACTION, BILATERAL    . ESOPHAGOGASTRODUODENOSCOPY (EGD) WITH PROPOFOL N/A 09/07/2018   Procedure: ESOPHAGOGASTRODUODENOSCOPY (EGD) WITH PROPOFOL;  Surgeon: Virgel Manifold, MD;  Location: [facility] ENDOSCOPY;  Service: Endoscopy;  Laterality: N/A;  . EYE SURGERY    . VAGINAL HYSTERECTOMY      Social History   Tobacco Use  . Smoking status: Never Smoker  . Smokeless tobacco: Never Used  Substance Use Topics  . Alcohol use: No    Alcohol/week: 0.0 standard drinks  . Drug use: Never     Medication list has been reviewed and updated.  Current Meds  Medication Sig  . calcium carbonate (OS-CAL - DOSED IN MG OF ELEMENTAL CALCIUM) 1250 (500 Ca) MG tablet Take 1 tablet by mouth.  Marland Kitchen lisinopril (ZESTRIL) 10 MG tablet Take 1 tablet (10 mg total) by mouth daily.  . metoprolol succinate (TOPROL-XL) 50 MG 24 hr tablet TAKE (1) TABLET BY MOUTH EVERY DAY  . mirtazapine (REMERON) 15  MG tablet Take 0.5 tablets (7.5 mg total) by mouth at bedtime.  . Omega 3 1000 MG CAPS Take 2 capsules (2,000 mg total) by mouth 2 (two) times daily.  . polyethylene glycol (MIRALAX) packet Take 17 g by mouth daily.    PHQ 2/9 Scores 07/31/2019 07/03/2019 12/31/2018 12/31/2018  PHQ - 2 Score 0 0 0 0  PHQ- 9 Score - 0 4 0    BP Readings from  Last 3 Encounters:  09/20/19 120/64  09/19/19 124/80  07/03/19 120/70    Physical Exam Vitals and nursing note reviewed.  Constitutional:      Appearance: She is well-developed.  HENT:     Head: Normocephalic.     Right Ear: External ear normal.     Left Ear: External ear normal.  Eyes:     General: Lids are everted, no foreign bodies appreciated. No scleral icterus.       Left eye: No foreign body or hordeolum.     Conjunctiva/sclera: Conjunctivae normal.     Right eye: Right conjunctiva is not injected.     Left eye: Left conjunctiva is not injected.     Pupils: Pupils are equal, round, and reactive to light.  Neck:     Thyroid: No thyromegaly.     Vascular: No JVD.     Trachea: No tracheal deviation.  Cardiovascular:     Rate and Rhythm: Normal rate and regular rhythm.     Heart sounds: Normal heart sounds. No murmur. No friction rub. No gallop.   Pulmonary:     Effort: Pulmonary effort is normal. No respiratory distress.     Breath sounds: Normal breath sounds. No wheezing or rales.  Abdominal:     General: Bowel sounds are normal.     Palpations: Abdomen is soft. There is no mass.     Tenderness: There is no abdominal tenderness. There is no guarding or rebound.  Musculoskeletal:        General: No tenderness. Normal range of motion.     Cervical back: Normal range of motion and neck supple.  Lymphadenopathy:     Cervical: No cervical adenopathy.  Skin:    General: Skin is warm.     Findings: No rash.  Neurological:     Mental Status: She is alert and oriented to person, place, and time.     Cranial Nerves: No cranial nerve deficit.     Deep Tendon Reflexes: Reflexes normal.  Psychiatric:        Mood and Affect: Mood is not anxious or depressed.     Wt Readings from Last 3 Encounters:  09/20/19 154 lb (69.9 kg)  09/19/19 157 lb (71.2 kg)  07/31/19 150 lb (68 kg)    BP 120/64   Pulse 68   Ht 5\' 4"  (1.626 m)   Wt 154 lb (69.9 kg)   BMI 26.43 kg/m    Assessment and Plan:  1. Encounter for postoperative wound care Patient underwent yesterday a debridement of a hematoma of a chronic basis (4 weeks) of the right leg.  This area was reevaluated today and has improved with decrease in the depth of the wound and minimal erythema surrounding.  Family members were present and we demonstrated how we would like this addressed.  Following sterile saline to the wound area a coverage of Furacin like antibiotic was applied followed by a Telfa pad followed by 4 x 4 gauze and rolled gauze wrap.  This was secured by a netting in  place and patient was placed on Augmentin 875 mg twice a day.  Family was instructed to redress daily and was given the remainder of the Furacin.  Patient has an appointment with wound care clinic on Monday but has been instructed if there is any change in the status prior to call on call for evaluation. - amoxicillin-clavulanate (AUGMENTIN) 875-125 MG tablet; Take 1 tablet by mouth 2 (two) times daily.  Dispense: 20 tablet; Refill: 0  2. Need for diphtheria-tetanus-pertussis (Tdap) vaccine Because of the open wound in the previous injury patient was given a Tdap vaccination as scheduled. - Tdap vaccine greater than or equal to 7yo IM

## 2019-09-23 ENCOUNTER — Other Ambulatory Visit: Payer: Self-pay

## 2019-09-23 ENCOUNTER — Encounter: Payer: Medicare Other | Attending: Physician Assistant | Admitting: Physician Assistant

## 2019-09-23 DIAGNOSIS — I10 Essential (primary) hypertension: Secondary | ICD-10-CM | POA: Insufficient documentation

## 2019-09-23 DIAGNOSIS — L97812 Non-pressure chronic ulcer of other part of right lower leg with fat layer exposed: Secondary | ICD-10-CM | POA: Diagnosis not present

## 2019-09-23 DIAGNOSIS — M1990 Unspecified osteoarthritis, unspecified site: Secondary | ICD-10-CM | POA: Insufficient documentation

## 2019-09-23 DIAGNOSIS — S8011XA Contusion of right lower leg, initial encounter: Secondary | ICD-10-CM | POA: Insufficient documentation

## 2019-09-23 DIAGNOSIS — X58XXXA Exposure to other specified factors, initial encounter: Secondary | ICD-10-CM | POA: Insufficient documentation

## 2019-09-23 DIAGNOSIS — S81801A Unspecified open wound, right lower leg, initial encounter: Secondary | ICD-10-CM | POA: Diagnosis not present

## 2019-09-23 NOTE — Progress Notes (Signed)
Karen Hooper, Jacqueli W. ([id number]) Visit Report for 09/23/2019 Chief Complaint Document Details Patient Name: Karen Hooper, Karen W. Date of Service: 09/23/2019 2:15 PM Medical Record Number: [id number] Patient Account Number: [id number] Date of Birth/Sex: 03/02/1932 (86 y.o. F) Treating RN: Primary Care Provider: Elizabeth SauerJones, Deanna Other Clinician: Referring Provider: Elizabeth SauerJones, Deanna Treating Provider/Extender: Linwood DibblesSTONE III, Lurine Imel Weeks in Treatment: 0 Information Obtained from: Patient Chief Complaint Right leg hematoma and ulcer Electronic Signature(s) Signed: 09/23/2019 3:00:26 PM By: Lenda KelpStone III, Shylin Keizer PA-C Entered By: Lenda KelpStone III, Kiaan Overholser on 09/23/2019 15:00:26 Karen Hooper, Karen W. ([id number]) -------------------------------------------------------------------------------- HPI Details Patient Name: Karen Hooper, Karen W. Date of Service: 09/23/2019 2:15 PM Medical Record Number: [id number] Patient Account Number: [id number] Date of Birth/Sex: 10/17/1931 (87 y.o. F) Treating RN: Primary Care Provider: Elizabeth SauerJones, Deanna Other Clinician: Referring Provider: Elizabeth SauerJones, Deanna Treating Provider/Extender: Linwood DibblesSTONE III, Humberto Addo Weeks in Treatment: 0 History of Present Illness HPI Description: 09/23/2019 upon evaluation today patient presents for initial inspection here in our clinic concerning issues that she has been having with her right anterior lower extremity. She actually sustained a traumatic wound when she hit her leg on the handle for her recliner roughly 1 month ago. She had a hematoma but initially never went to see anyone about this. Subsequently she then ended up continuing to have issues to the point that her pastor came by and told her that he really felt like she should go get this checked out at the doctor's office. She then subsequently did go in to see her primary care provider who debrided this away this past Friday for her. Subsequently we were then able to work her in for today. Nonetheless overall the patient does seem to be  making some progress. She does have a history of hypertension but other than this really has no major medical problems and appears to be fairly healthy. Electronic Signature(s) Signed: 09/23/2019 3:07:57 PM By: Lenda KelpStone III, Lauri Till PA-C Entered By: Lenda KelpStone III, Kermit Arnette on 09/23/2019 15:07:57 Karen Hooper, Karen W. ([id number]) -------------------------------------------------------------------------------- Physical Exam Details Patient Name: Karen Hooper, Karen W. Date of Service: 09/23/2019 2:15 PM Medical Record Number: [id number] Patient Account Number: [id number] Date of Birth/Sex: 12/01/1931 (87 y.o. F) Treating RN: Primary Care Provider: Elizabeth SauerJones, Deanna Other Clinician: Referring Provider: Elizabeth SauerJones, Deanna Treating Provider/Extender: STONE III, Tucker Steedley Weeks in Treatment: 0 Constitutional sitting or standing blood pressure is within target range for patient.. pulse regular and within target range for patient.Marland Kitchen. respirations regular, non-labored and within target range for patient.Marland Kitchen. temperature within target range for patient.. Well-nourished and well-hydrated in no acute distress. Eyes conjunctiva clear no eyelid edema noted. pupils equal round and reactive to light and accommodation. Ears, Nose, Mouth, and Throat no gross abnormality of ear auricles or external auditory canals. normal hearing noted during conversation. mucus membranes moist. Respiratory normal breathing without difficulty. Cardiovascular 2+ dorsalis pedis/posterior tibialis pulses. no clubbing, cyanosis, significant edema, <3 sec cap refill. Musculoskeletal normal gait and posture. no significant deformity or arthritic changes, no loss or range of motion, no clubbing. Psychiatric this patient is able to make decisions and demonstrates good insight into disease process. Alert and Oriented x 3. pleasant and cooperative. Notes Upon inspection patient's wound bed actually showed signs of good granulation at this time. Fortunately there is no evidence of  active infection which is great news. No fevers, chills, nausea, vomiting, or diarrhea. There was a little bit of hyper granular tissue and poor granulation tissue on the surface of the wound which actually seems to be doing well I was able to mechanically debride this  away no sharp debridement was necessary. With that being said she did have a little bit of discomfort I was very careful in this regard not to be too aggressive obviously. She seems to have good pulses and no significant edema at this point I think this is simply more of a traumatic ulcer to be perfectly honest. Electronic Signature(s) Signed: 09/23/2019 3:09:27 PM By: Worthy Keeler PA-C Entered By: Worthy Keeler on 09/23/2019 15:09:26 Karen Hooper ([id number]) -------------------------------------------------------------------------------- Physician Orders Details Patient Name: Karen Hooper Date of Service: 09/23/2019 2:15 PM Medical Record Number: [id number] Patient Account Number: [id number] Date of Birth/Sex: Mar 12, 1932 (87 y.o. F) Treating RN: Cornell Barman Primary Care Provider: Otilio Miu Other Clinician: Referring Provider: Otilio Miu Treating Provider/Extender: Melburn Hake, Brandun Pinn Weeks in Treatment: 0 Verbal / Phone Orders: No Diagnosis Coding ICD-10 Coding Code Description S80.11XA Contusion of right lower leg, initial encounter S81.801A Unspecified open wound, right lower leg, initial encounter I10 Essential (primary) hypertension Wound Cleansing Wound #1 Right Lower Leg o Dial antibacterial soap, wash wounds, rinse and pat dry prior to dressing wounds Primary Wound Dressing Wound #1 Right Lower Leg o Hydrafera Blue Ready Transfer Secondary Dressing Wound #1 Right Lower Leg o Conform/Kerlix Dressing Change Frequency Wound #1 Right Lower Leg o Change Dressing Monday, Wednesday, Friday Follow-up Appointments Wound #1 Right Lower Leg o Return Appointment in 1 week. Edema Control Wound #1  Right Lower Leg o Elevate legs to the level of the heart and pump ankles as often as possible Additional Orders / Instructions Wound #1 Right Lower Leg o Increase protein intake. o Activity as tolerated Medications-please add to medication list. Wound #1 Right Lower Leg o P.O. Antibiotics - continue antibiotics prescribed by PCP Electronic Signature(s) Signed: 09/23/2019 4:45:00 PM By: Gretta Cool, BSN, RN, CWS, Kim RN, BSN Signed: 09/23/2019 4:46:45 PM By: Worthy Keeler PA-C Entered By: Gretta Cool BSN, RN, CWS, Kim on 09/23/2019 15:06:50 Karen Hooper ([id number]) -------------------------------------------------------------------------------- Problem List Details Patient Name: Karen Hooper Date of Service: 09/23/2019 2:15 PM Medical Record Number: [id number] Patient Account Number: [id number] Date of Birth/Sex: 1932-06-03 (87 y.o. F) Treating RN: Primary Care Provider: Otilio Miu Other Clinician: Referring Provider: Otilio Miu Treating Provider/Extender: Melburn Hake, Ketara Cavness Weeks in Treatment: 0 Active Problems ICD-10 Evaluated Encounter Code Description Active Date Today Diagnosis S80.11XA Contusion of right lower leg, initial encounter 09/23/2019 No Yes S81.801A Unspecified open wound, right lower leg, initial encounter 09/23/2019 No Yes I10 Essential (primary) hypertension 09/23/2019 No Yes Inactive Problems Resolved Problems Electronic Signature(s) Signed: 09/23/2019 2:59:56 PM By: Worthy Keeler PA-C Entered By: Worthy Keeler on 09/23/2019 14:59:56 Karen Hooper ([id number]) -------------------------------------------------------------------------------- Progress Note Details Patient Name: Karen Hooper Date of Service: 09/23/2019 2:15 PM Medical Record Number: [id number] Patient Account Number: [id number] Date of Birth/Sex: 12/28/1931 (87 y.o. F) Treating RN: Primary Care Provider: Otilio Miu Other Clinician: Referring Provider: Otilio Miu Treating  Provider/Extender: Melburn Hake, Renan Danese Weeks in Treatment: 0 Subjective Chief Complaint Information obtained from Patient Right leg hematoma and ulcer History of Present Illness (HPI) 09/23/2019 upon evaluation today patient presents for initial inspection here in our clinic concerning issues that she has been having with her right anterior lower extremity. She actually sustained a traumatic wound when she hit her leg on the handle for her recliner roughly 1 month ago. She had a hematoma but initially never went to see anyone about this. Subsequently she then ended up continuing to have issues to the point that her pastor came  by and told her that he really felt like she should go get this checked out at the doctor's office. She then subsequently did go in to see her primary care provider who debrided this away this past Friday for her. Subsequently we were then able to work her in for today. Nonetheless overall the patient does seem to be making some progress. She does have a history of hypertension but other than this really has no major medical problems and appears to be fairly healthy. Patient History Information obtained from Patient. Allergies doxycycline, atorvastatin, etodolac, ezetimibe Family History No family history of Cancer. Social History Never smoker, Marital Status - Married, Alcohol Use - Never, Drug Use - No History, Caffeine Use - Daily. Medical History Cardiovascular Patient has history of Hypertension Musculoskeletal Patient has history of Osteoarthritis Review of Systems (ROS) Eyes Complains or has symptoms of Glasses / Contacts. Denies complaints or symptoms of Dry Eyes, Vision Changes. Ear/Nose/Mouth/Throat Denies complaints or symptoms of Difficult clearing ears, Sinusitis. Hematologic/Lymphatic Denies complaints or symptoms of Bleeding / Clotting Disorders, Human Immunodeficiency Virus. Respiratory Denies complaints or symptoms of Chronic or frequent coughs,  Shortness of Breath. Cardiovascular Denies complaints or symptoms of Chest pain, LE edema. Gastrointestinal Denies complaints or symptoms of Frequent diarrhea, Nausea, Vomiting. Endocrine Denies complaints or symptoms of Hepatitis, Thyroid disease, Polydypsia (Excessive Thirst). Genitourinary Denies complaints or symptoms of Kidney failure/ Dialysis, Incontinence/dribbling. Immunological Denies complaints or symptoms of Hives, Itching. Integumentary (Skin) Complains or has symptoms of Wounds - right lower leg. Denies complaints or symptoms of Bleeding or bruising tendency, Breakdown, Swelling. Neurologic Denies complaints or symptoms of Numbness/parasthesias, Focal/Weakness. GENESE, QUEBEDEAUX ([id number]) Psychiatric Denies complaints or symptoms of Anxiety, Claustrophobia. Objective Constitutional sitting or standing blood pressure is within target range for patient.. pulse regular and within target range for patient.Marland Kitchen respirations regular, non-labored and within target range for patient.Marland Kitchen temperature within target range for patient.. Well-nourished and well-hydrated in no acute distress. Vitals Time Taken: 2:44 PM, Height: 64 in, Weight: 156 lbs, BMI: 26.8, Temperature: 98.3 F, Pulse: 58 bpm, Respiratory Rate: 16 breaths/min, Blood Pressure: 129/60 mmHg. Eyes conjunctiva clear no eyelid edema noted. pupils equal round and reactive to light and accommodation. Ears, Nose, Mouth, and Throat no gross abnormality of ear auricles or external auditory canals. normal hearing noted during conversation. mucus membranes moist. Respiratory normal breathing without difficulty. Cardiovascular 2+ dorsalis pedis/posterior tibialis pulses. no clubbing, cyanosis, significant edema, Musculoskeletal normal gait and posture. no significant deformity or arthritic changes, no loss or range of motion, no clubbing. Psychiatric this patient is able to make decisions and demonstrates good insight into  disease process. Alert and Oriented x 3. pleasant and cooperative. General Notes: Upon inspection patient's wound bed actually showed signs of good granulation at this time. Fortunately there is no evidence of active infection which is great news. No fevers, chills, nausea, vomiting, or diarrhea. There was a little bit of hyper granular tissue and poor granulation tissue on the surface of the wound which actually seems to be doing well I was able to mechanically debride this away no sharp debridement was necessary. With that being said she did have a little bit of discomfort I was very careful in this regard not to be too aggressive obviously. She seems to have good pulses and no significant edema at this point I think this is simply more of a traumatic ulcer to be perfectly honest. Integumentary (Hair, Skin) Wound #1 status is Open. Original cause of wound was Trauma. The  wound is located on the Right Lower Leg. The wound measures 1.6cm length x 2cm width x 0.1cm depth; 2.513cm^2 area and 0.251cm^3 volume. There is Fat Layer (Subcutaneous Tissue) Exposed exposed. There is no tunneling or undermining noted. There is a medium amount of serous drainage noted. The wound margin is flat and intact. There is small (1-33%) granulation within the wound bed. There is a large (67-100%) amount of necrotic tissue within the wound bed including Adherent Slough. Assessment Active Problems ICD-10 Contusion of right lower leg, initial encounter Unspecified open wound, right lower leg, initial encounter Essential (primary) hypertension Plan KAISLEE, CHAO. ([id number]) Wound Cleansing: Wound #1 Right Lower Leg: Dial antibacterial soap, wash wounds, rinse and pat dry prior to dressing wounds Primary Wound Dressing: Wound #1 Right Lower Leg: Hydrafera Blue Ready Transfer Secondary Dressing: Wound #1 Right Lower Leg: Conform/Kerlix Dressing Change Frequency: Wound #1 Right Lower Leg: Change Dressing Monday,  Wednesday, Friday Follow-up Appointments: Wound #1 Right Lower Leg: Return Appointment in 1 week. Edema Control: Wound #1 Right Lower Leg: Elevate legs to the level of the heart and pump ankles as often as possible Additional Orders / Instructions: Wound #1 Right Lower Leg: Increase protein intake. Activity as tolerated Medications-please add to medication list.: Wound #1 Right Lower Leg: P.O. Antibiotics - continue antibiotics prescribed by PCP 1. My suggestion currently is can be that we go ahead and initiate treatment with [facility] I think this will be a good dressing of choice at this point 2. I would recommend as well that the patient continue to clean the area with mild soap and water such as Dial antibacterial soap I also think that she should cover this with roll gauze in order to give a little extra padding as well I think this will do very well. 3. I recommend that she continue with the oral Augmentin as prescribed by her primary care provider. We will see patient back for reevaluation in 1 week here in the clinic. If anything worsens or changes patient will contact our office for additional recommendations. Electronic Signature(s) Signed: 09/23/2019 3:10:41 PM By: Lenda Kelp PA-C Entered By: Lenda Kelp on 09/23/2019 15:10:41 Karen Hooper ([id number]) -------------------------------------------------------------------------------- ROS/PFSH Details Patient Name: Karen Hooper Date of Service: 09/23/2019 2:15 PM Medical Record Number: [id number] Patient Account Number: [id number] Date of Birth/Sex: 01-11-1932 (87 y.o. F) Treating RN: Huel Coventry Primary Care Provider: Elizabeth Sauer Other Clinician: Referring Provider: Elizabeth Sauer Treating Provider/Extender: STONE III, Lakecia Deschamps Weeks in Treatment: 0 Information Obtained From Patient Eyes Complaints and Symptoms: Positive for: Glasses / Contacts Negative for: Dry Eyes; Vision  Changes Ear/Nose/Mouth/Throat Complaints and Symptoms: Negative for: Difficult clearing ears; Sinusitis Hematologic/Lymphatic Complaints and Symptoms: Negative for: Bleeding / Clotting Disorders; Human Immunodeficiency Virus Respiratory Complaints and Symptoms: Negative for: Chronic or frequent coughs; Shortness of Breath Cardiovascular Complaints and Symptoms: Negative for: Chest pain; LE edema Medical History: Positive for: Hypertension Gastrointestinal Complaints and Symptoms: Negative for: Frequent diarrhea; Nausea; Vomiting Endocrine Complaints and Symptoms: Negative for: Hepatitis; Thyroid disease; Polydypsia (Excessive Thirst) Genitourinary Complaints and Symptoms: Negative for: Kidney failure/ Dialysis; Incontinence/dribbling Immunological Complaints and Symptoms: Negative for: Hives; Itching Integumentary (Skin) Complaints and Symptoms: Positive for: Wounds - right lower leg Negative for: Bleeding or bruising tendency; Breakdown; Swelling Neurologic Karen Hooper, Karen W. ([id number]) Complaints and Symptoms: Negative for: Numbness/parasthesias; Focal/Weakness Psychiatric Complaints and Symptoms: Negative for: Anxiety; Claustrophobia Constitutional Symptoms (General Health) Musculoskeletal Medical History: Positive for: Osteoarthritis Oncologic Immunizations Pneumococcal Vaccine: Received Pneumococcal Vaccination: No Implantable  Devices None Family and Social History Cancer: No; Never smoker; Marital Status - Married; Alcohol Use: Never; Drug Use: No History; Caffeine Use: Daily Electronic Signature(s) Signed: 09/23/2019 4:45:00 PM By: Elliot Gurney, BSN, RN, CWS, Kim RN, BSN Signed: 09/23/2019 4:46:45 PM By: Lenda Kelp PA-C Entered By: Elliot Gurney BSN, RN, CWS, Kim on 09/23/2019 14:54:55 Karen Hooper ([id number]) -------------------------------------------------------------------------------- SuperBill Details Patient Name: Karen Hooper Date of Service:  09/23/2019 Medical Record Number: [id number] Patient Account Number: [id number] Date of Birth/Sex: 06/27/1932 (87 y.o. F) Treating RN: Primary Care Provider: Elizabeth Sauer Other Clinician: Referring Provider: Elizabeth Sauer Treating Provider/Extender: STONE III, Dakoda Laventure Weeks in Treatment: 0 Diagnosis Coding ICD-10 Codes Code Description S80.11XA Contusion of right lower leg, initial encounter S81.801A Unspecified open wound, right lower leg, initial encounter I10 Essential (primary) hypertension Facility Procedures CPT4 Code: [id number] Description: 99213 - WOUND CARE VISIT-LEV 3 EST PT Modifier: Quantity: 1 Physician Procedures CPT4 Code: [id number] Description: WC PHYS LEVEL 3 o NEW PT Modifier: Quantity: 1 CPT4 Code: Description: ICD-10 Diagnosis Description S80.11XA Contusion of right lower leg, initial encounter S81.801A Unspecified open wound, right lower leg, initial encounter I10 Essential (primary) hypertension Modifier: Quantity: Electronic Signature(s) Signed: 09/23/2019 3:11:48 PM By: Lenda Kelp PA-C Entered By: Lenda Kelp on 09/23/2019 15:11:48

## 2019-09-23 NOTE — Progress Notes (Signed)
Karen Hooper, Karen Hooper ([id number]) Visit Report for 09/23/2019 Allergy List Details Patient Name: Karen Hooper, Karen Hooper Date of Service: 09/23/2019 2:15 PM Medical Record Number: [id number] Patient Account Number: [id number] Date of Birth/Sex: March 02, 1932 (86 y.o. F) Treating RN: Huel Coventry Primary Care Sabryn Preslar: Elizabeth Sauer Other Clinician: Referring Vena Bassinger: Elizabeth Sauer Treating Itzae Mccurdy/Extender: STONE III, HOYT Weeks in Treatment: 0 Allergies Active Allergies doxycycline atorvastatin etodolac ezetimibe Allergy Notes Electronic Signature(s) Signed: 09/23/2019 4:45:00 PM By: Elliot Gurney, BSN, RN, CWS, Kim RN, BSN Entered By: Elliot Gurney, BSN, RN, CWS, Kim on 09/23/2019 14:52:55 Karen Hooper ([id number]) -------------------------------------------------------------------------------- Arrival Information Details Patient Name: Karen Hooper Date of Service: 09/23/2019 2:15 PM Medical Record Number: [id number] Patient Account Number: [id number] Date of Birth/Sex: 1931-11-24 (87 y.o. F) Treating RN: Huel Coventry Primary Care Tanayah Squitieri: Elizabeth Sauer Other Clinician: Referring Claudette Wermuth: Elizabeth Sauer Treating Tyshia Fenter/Extender: Linwood Dibbles, HOYT Weeks in Treatment: 0 Visit Information Patient Arrived: Ambulatory Arrival Time: 14:41 Accompanied By: daughter Transfer Assistance: None Patient Identification Verified: Yes Secondary Verification Process Completed: Yes Patient Requires Transmission-Based Precautions: No Patient Has Alerts: No Electronic Signature(s) Signed: 09/23/2019 4:45:00 PM By: Elliot Gurney, BSN, RN, CWS, Kim RN, BSN Entered By: Elliot Gurney, BSN, RN, CWS, Kim on 09/23/2019 14:44:02 Karen Hooper ([id number]) -------------------------------------------------------------------------------- Clinic Level of Care Assessment Details Patient Name: Karen Hooper Date of Service: 09/23/2019 2:15 PM Medical Record Number: [id number] Patient Account Number: [id number] Date of Birth/Sex: 1931/11/26 (87 y.o.  F) Treating RN: Huel Coventry Primary Care Skyylar Kopf: Elizabeth Sauer Other Clinician: Referring Tagg Eustice: Elizabeth Sauer Treating Skylee Baird/Extender: Linwood Dibbles, HOYT Weeks in Treatment: 0 Clinic Level of Care Assessment Items TOOL 2 Quantity Score []  - Use when only an EandM is performed on the INITIAL visit 0 ASSESSMENTS - Nursing Assessment / Reassessment X - General Physical Exam (combine w/ comprehensive assessment (listed just below) when performed on new pt. 1 20 evals) X- 1 25 Comprehensive Assessment (HX, ROS, Risk Assessments, Wounds Hx, etc.) ASSESSMENTS - Wound and Skin Assessment / Reassessment X - Simple Wound Assessment / Reassessment - one wound 1 5 []  - 0 Complex Wound Assessment / Reassessment - multiple wounds []  - 0 Dermatologic / Skin Assessment (not related to wound area) ASSESSMENTS - Ostomy and/or Continence Assessment and Care []  - Incontinence Assessment and Management 0 []  - 0 Ostomy Care Assessment and Management (repouching, etc.) PROCESS - Coordination of Care X - Simple Patient / Family Education for ongoing care 1 15 []  - 0 Complex (extensive) Patient / Family Education for ongoing care []  - 0 Staff obtains , Records, Test Results / Process Orders []  - 0 Staff telephones HHA, Nursing Homes / Clarify orders / etc []  - 0 Routine Transfer to another Facility (non-emergent condition) []  - 0 Routine Hospital Admission (non-emergent condition) []  - 0 New Admissions / / Ordering NPWT, Apligraf, etc. []  - 0 Emergency Hospital Admission (emergent condition) X- 1 10 Simple Discharge Coordination []  - 0 Complex (extensive) Discharge Coordination PROCESS - Special Needs []  - Pediatric / Minor Patient Management 0 []  - 0 Isolation Patient Management []  - 0 Hearing / Language / Visual special needs []  - 0 Assessment of Community assistance (transportation, D/C planning, etc.) []  - 0 Additional assistance / Altered  mentation []  - 0 Support Surface(s) Assessment (bed, cushion, seat, etc.) INTERVENTIONS - Wound Cleansing / Measurement []  - Wound Imaging (photographs - any number of wounds) 0 Severin, Marcell W. ( ) []  - 0 Wound Tracing (instead of photographs) X- 1 5 Simple Wound Measurement -  one wound []  - 0 Complex Wound Measurement - multiple wounds X- 1 5 Simple Wound Cleansing - one wound []  - 0 Complex Wound Cleansing - multiple wounds INTERVENTIONS - Wound Dressings []  - Small Wound Dressing one or multiple wounds 0 X- 1 15 Medium Wound Dressing one or multiple wounds []  - 0 Large Wound Dressing one or multiple wounds []  - 0 Application of Medications - injection INTERVENTIONS - Miscellaneous []  - External ear exam 0 []  - 0 Specimen Collection (cultures, biopsies, blood, body fluids, etc.) []  - 0 Specimen(s) / Culture(s) sent or taken to Lab for analysis []  - 0 Patient Transfer (multiple staff / [employer] fast streamer / Similar devices) []  - 0 Simple Staple / Suture removal (25 or less) []  - 0 Complex Staple / Suture removal (26 or more) []  - 0 Hypo / Hyperglycemic Management (close monitor of Blood Glucose) []  - 0 Ankle / Brachial Index (ABI) - do not check if billed separately Has the patient been seen at the hospital within the last three years: Yes Total Score: 100 Level Of Care: New/Established - Level 3 Electronic Signature(s) Signed: 09/23/2019 4:45:00 PM By: Gretta Cool, BSN, RN, CWS, Kim RN, BSN Entered By: Gretta Cool, BSN, RN, CWS, Kim on 09/23/2019 15:07:48 Karen Hooper ([id number]) -------------------------------------------------------------------------------- Encounter Discharge Information Details Patient Name: Karen Hooper Date of Service: 09/23/2019 2:15 PM Medical Record Number: [id number] Patient Account Number: [id number] Date of Birth/Sex: 10-21-1931 (87 y.o. F) Treating RN: Cornell Barman Primary Care Alesia Oshields: Otilio Miu Other Clinician: Referring Aracelly Tencza: Otilio Miu Treating Judene Logue/Extender: Melburn Hake, HOYT Weeks in Treatment: 0 Encounter Discharge Information Items Discharge Condition: Stable Ambulatory Status: Ambulatory Discharge Destination: Home Transportation: Private Auto Accompanied By: daughter Schedule Follow-up Appointment: Yes Clinical Summary of Care: Electronic Signature(s) Signed: 09/23/2019 4:45:00 PM By: Gretta Cool, BSN, RN, CWS, Kim RN, BSN Entered By: Gretta Cool, BSN, RN, CWS, Kim on 09/23/2019 15:13:10 Karen Hooper ([id number]) -------------------------------------------------------------------------------- Lower Extremity Assessment Details Patient Name: Karen Hooper Date of Service: 09/23/2019 2:15 PM Medical Record Number: [id number] Patient Account Number: [id number] Date of Birth/Sex: 07-24-1931 (87 y.o. F) Treating RN: Cornell Barman Primary Care Schawn Byas: Otilio Miu Other Clinician: Referring Lawana Hartzell: Otilio Miu Treating Jamar Weatherall/Extender: STONE III, HOYT Weeks in Treatment: 0 Edema Assessment Assessed: [Left: No] [Right: Yes] Edema: [Left: Ye] [Right: s] Calf Left: Right: Point of Measurement: 30 cm From Medial Instep cm 36 cm Ankle Left: Right: Point of Measurement: 10 cm From Medial Instep cm 23 cm Vascular Assessment Pulses: Dorsalis Pedis Palpable: [Right:Yes] Posterior Tibial Palpable: [Right:Yes] Electronic Signature(s) Signed: 09/23/2019 4:45:00 PM By: Gretta Cool, BSN, RN, CWS, Kim RN, BSN Entered By: Gretta Cool, BSN, RN, CWS, Kim on 09/23/2019 14:51:55 Karen Hooper ([id number]) -------------------------------------------------------------------------------- Multi Wound Chart Details Patient Name: Karen Hooper Date of Service: 09/23/2019 2:15 PM Medical Record Number: [id number] Patient Account Number: [id number] Date of Birth/Sex: 1931-09-15 (87 y.o. F) Treating RN: Cornell Barman Primary Care Ege Muckey: Otilio Miu Other Clinician: Referring Cloyde Oregel: Otilio Miu Treating Nikelle Malatesta/Extender: STONE  III, HOYT Weeks in Treatment: 0 Vital Signs Height(in): 64 Pulse(bpm): 58 Weight(lbs): 156 Blood Pressure(mmHg): 129/60 Body Mass Index(BMI): 27 Temperature(F): 98.3 Respiratory Rate(breaths/min): 16 Photos: [N/A:N/A] Wound Location: Right Lower Leg N/A N/A Wounding Event: Trauma N/A N/A Primary Etiology: Trauma, Other N/A N/A Date Acquired: 08/26/2019 N/A N/A Weeks of Treatment: 0 N/A N/A Wound Status: Open N/A N/A Measurements L x W x D (cm) 1.6x2x0.1 N/A N/A Area (cm) : 2.513 N/A N/A Volume (cm) : 0.251 N/A N/A % Reduction in  Area: 0.00% N/A N/A % Reduction in Volume: 0.00% N/A N/A Classification: Full Thickness Without Exposed N/A N/A Support Structures Exudate Amount: Medium N/A N/A Exudate Type: Serous N/A N/A Exudate Color: amber N/A N/A Wound Margin: Flat and Intact N/A N/A Granulation Amount: Small (1-33%) N/A N/A Necrotic Amount: Large (67-100%) N/A N/A Exposed Structures: Fat Layer (Subcutaneous Tissue) N/A N/A Exposed: Yes Fascia: No Tendon: No Muscle: No Joint: No Bone: No Epithelialization: None N/A N/A Treatment Notes Electronic Signature(s) Signed: 09/23/2019 4:45:00 PM By: Elliot Gurney, BSN, RN, CWS, Kim RN, BSN Entered By: Elliot Gurney, BSN, RN, CWS, Kim on 09/23/2019 15:04:25 Karen Hooper ([id number]) -------------------------------------------------------------------------------- Multi-Disciplinary Care Plan Details Patient Name: Karen Hooper Date of Service: 09/23/2019 2:15 PM Medical Record Number: [id number] Patient Account Number: [id number] Date of Birth/Sex: Jan 17, 1932 (87 y.o. F) Treating RN: Huel Coventry Primary Care Mizraim Harmening: Elizabeth Sauer Other Clinician: Referring Saryah Loper: Elizabeth Sauer Treating Lataysha Vohra/Extender: Linwood Dibbles, HOYT Weeks in Treatment: 0 Active Inactive Necrotic Tissue Nursing Diagnoses: Impaired tissue integrity related to necrotic/devitalized tissue Knowledge deficit related to management of necrotic/devitalized  tissue Goals: Necrotic/devitalized tissue will be minimized in the wound bed Date Initiated: 09/23/2019 Target Resolution Date: 10/07/2019 Goal Status: Active Interventions: Assess patient pain level pre-, during and post procedure and prior to discharge Treatment Activities: Apply topical anesthetic as ordered : 09/23/2019 Excisional debridement : 09/23/2019 Notes: Orientation to the Wound Care Program Nursing Diagnoses: Knowledge deficit related to the wound healing center program Goals: Patient/caregiver will verbalize understanding of the Wound Healing Center Program Date Initiated: 09/23/2019 Target Resolution Date: 10/08/2019 Goal Status: Active Interventions: Provide education on orientation to the wound center Notes: Wound/Skin Impairment Nursing Diagnoses: Impaired tissue integrity Goals: Ulcer/skin breakdown will have a volume reduction of 30% by week 4 Date Initiated: 09/23/2019 Target Resolution Date: 10/24/2019 Goal Status: Active Interventions: Assess ulceration(s) every visit Treatment Activities: Skin care regimen initiated : 09/23/2019 Karen Hooper ([id number]) Notes: Electronic Signature(s) Signed: 09/23/2019 4:45:00 PM By: Elliot Gurney, BSN, RN, CWS, Kim RN, BSN Entered By: Elliot Gurney, BSN, RN, CWS, Kim on 09/23/2019 15:04:06 Karen Hooper ([id number]) -------------------------------------------------------------------------------- Pain Assessment Details Patient Name: Karen Hooper Date of Service: 09/23/2019 2:15 PM Medical Record Number: [id number] Patient Account Number: [id number] Date of Birth/Sex: June 14, 1932 (87 y.o. F) Treating RN: Huel Coventry Primary Care Napolean Sia: Elizabeth Sauer Other Clinician: Referring Abree Romick: Elizabeth Sauer Treating Keaun Schnabel/Extender: STONE III, HOYT Weeks in Treatment: 0 Active Problems Location of Pain Severity and Description of Pain Patient Has Paino No Site Locations Pain Management and Medication Current Pain  Management: Notes Patient denies pain at this time. Electronic Signature(s) Signed: 09/23/2019 4:45:00 PM By: Elliot Gurney, BSN, RN, CWS, Kim RN, BSN Entered By: Elliot Gurney, BSN, RN, CWS, Kim on 09/23/2019 14:44:31 Karen Hooper ([id number]) -------------------------------------------------------------------------------- Patient/Caregiver Education Details Patient Name: Karen Hooper Date of Service: 09/23/2019 2:15 PM Medical Record Number: [id number] Patient Account Number: [id number] Date of Birth/Gender: 06-06-1932 (87 y.o. F) Treating RN: Huel Coventry Primary Care Physician: Elizabeth Sauer Other Clinician: Referring Physician: Elizabeth Sauer Treating Physician/Extender: Linwood Dibbles, HOYT Weeks in Treatment: 0 Education Assessment Education Provided To: Patient Education Topics Provided Welcome To The Wound Care Center: Handouts: Welcome To The Wound Care Center Methods: Demonstration, Explain/Verbal Responses: State content correctly Wound/Skin Impairment: Handouts: Caring for Your Ulcer, Other: dressing changes as prescribed Methods: Demonstration, Explain/Verbal Responses: State content correctly Electronic Signature(s) Signed: 09/23/2019 4:45:00 PM By: Elliot Gurney, BSN, RN, CWS, Kim RN, BSN Entered By: Elliot Gurney, BSN, RN, CWS, Kim on 09/23/2019 46:27:03 Karen Hooper ([id number]) --------------------------------------------------------------------------------  Wound Assessment Details Patient Name: Karen Hooper, Karen Hooper Date of Service: 09/23/2019 2:15 PM Medical Record Number: [id number] Patient Account Number: [id number] Date of Birth/Sex: 1932/05/08 (85 y.o. F) Treating RN: Huel Coventry Primary Care Cari Burgo: Elizabeth Sauer Other Clinician: Referring Kydan Shanholtzer: Elizabeth Sauer Treating Tatiyanna Lashley/Extender: STONE III, HOYT Weeks in Treatment: 0 Wound Status Wound Number: 1 Primary Etiology: Trauma, Other Wound Location: Right Lower Leg Wound Status: Open Wounding Event: Trauma Date Acquired:  08/26/2019 Weeks Of Treatment: 0 Clustered Wound: No Photos Wound Measurements Length: (cm) 1.6 Width: (cm) 2 Depth: (cm) 0.1 Area: (cm) 2.513 Volume: (cm) 0.251 % Reduction in Area: 0% % Reduction in Volume: 0% Epithelialization: None Tunneling: No Undermining: No Wound Description Classification: Full Thickness Without Exposed Support Structu Wound Margin: Flat and Intact Exudate Amount: Medium Exudate Type: Serous Exudate Color: amber res Foul Odor After Cleansing: No Slough/Fibrino Yes Wound Bed Granulation Amount: Small (1-33%) Exposed Structure Necrotic Amount: Large (67-100%) Fascia Exposed: No Necrotic Quality: Adherent Slough Fat Layer (Subcutaneous Tissue) Exposed: Yes Tendon Exposed: No Muscle Exposed: No Joint Exposed: No Bone Exposed: No Treatment Notes Wound #1 (Right Lower Leg) 1. Cleansed with: Cleanse wound with antibacterial soap and water 2. Anesthetic Topical Lidocaine 4% cream to wound bed prior to debridement 4. Dressing Applied: Other dressing (specify in notes) Karen Hooper, RAMER. ([id number]) 5. Secondary Dressing Applied Gauze and Kerlix/Conform Electronic Signature(s) Signed: 09/23/2019 4:45:00 PM By: Elliot Gurney, BSN, RN, CWS, Kim RN, BSN Entered By: Elliot Gurney, BSN, RN, CWS, Kim on 09/23/2019 14:51:12 Karen Hooper ([id number]) -------------------------------------------------------------------------------- Vitals Details Patient Name: Karen Hooper Date of Service: 09/23/2019 2:15 PM Medical Record Number: [id number] Patient Account Number: [id number] Date of Birth/Sex: [birth year] (87 y.o. F) Treating RN: Huel Coventry Primary Care Yu Cragun: Elizabeth Sauer Other Clinician: Referring Eriyana Sweeten: Elizabeth Sauer Treating Burgandy Hackworth/Extender: STONE III, HOYT Weeks in Treatment: 0 Vital Signs Time Taken: 14:44 Temperature (F): 98.3 Height (in): 64 Pulse (bpm): 58 Weight (lbs): 156 Respiratory Rate (breaths/min): 16 Body Mass Index (BMI): 26.8 Blood  Pressure (mmHg): 129/60 Reference Range: 80 - 120 mg / dl Electronic Signature(s) Signed: 09/23/2019 4:45:00 PM By: Elliot Gurney, BSN, RN, CWS, Kim RN, BSN Entered By: Elliot Gurney, BSN, RN, CWS, Kim on 09/23/2019 14:45:19

## 2019-09-23 NOTE — Progress Notes (Signed)
Karen, Hooper ([id number]) Visit Report for 09/23/2019 Abuse/Suicide Risk Screen Details Patient Name: Karen Hooper, Karen Hooper Date of Service: 09/23/2019 2:15 PM Medical Record Number: [id number] Patient Account Number: [id number] Date of Birth/Sex: 09-06-1931 (86 y.o. F) Treating RN: Cornell Barman Primary Care Toribio Seiber: Otilio Miu Other Clinician: Referring Aelyn Stanaland: Otilio Miu Treating Makana Feigel/Extender: STONE III, HOYT Weeks in Treatment: 0 Abuse/Suicide Risk Screen Items Answer ABUSE RISK SCREEN: Has anyone close to you tried to hurt or harm you recentlyo No Do you feel uncomfortable with anyone in your familyo No Has anyone forced you do things that you didnot want to doo No Electronic Signature(s) Signed: 09/23/2019 4:45:00 PM By: Gretta Cool, BSN, RN, CWS, Kim RN, BSN Entered By: Gretta Cool, BSN, RN, CWS, Kim on 09/23/2019 14:55:03 Karen Hooper ([id number]) -------------------------------------------------------------------------------- Activities of Daily Living Details Patient Name: Karen Hooper Date of Service: 09/23/2019 2:15 PM Medical Record Number: [id number] Patient Account Number: [id number] Date of Birth/Sex: 20-Mar-1932 (87 y.o. F) Treating RN: Cornell Barman Primary Care Mayling Aber: Otilio Miu Other Clinician: Referring Twala Collings: Otilio Miu Treating Delila Kuklinski/Extender: STONE III, HOYT Weeks in Treatment: 0 Activities of Daily Living Items Answer Activities of Daily Living (Please select one for each item) Drive Automobile Need Assistance Take Medications Completely Able Use Telephone Completely Able Care for Appearance Completely Able Use Toilet Completely Able Bath / Shower Completely Able Dress Self Completely Able Feed Self Completely Able Walk Completely Able Get In / Out Bed Completely Able Housework Completely Able Prepare Meals Completely [place] for Self Completely Able Electronic Signature(s) Signed: 09/23/2019 4:45:00 PM By:  Gretta Cool, BSN, RN, CWS, Kim RN, BSN Entered By: Gretta Cool, BSN, RN, CWS, Kim on 09/23/2019 14:55:21 Karen Hooper ([id number]) -------------------------------------------------------------------------------- Education Screening Details Patient Name: Karen Hooper Date of Service: 09/23/2019 2:15 PM Medical Record Number: [id number] Patient Account Number: [id number] Date of Birth/Sex: 02/20/1932 (87 y.o. F) Treating RN: Cornell Barman Primary Care Chania Kochanski: Otilio Miu Other Clinician: Referring Danielly Ackerley: Otilio Miu Treating Hughie Melroy/Extender: Melburn Hake, HOYT Weeks in Treatment: 0 Primary Learner Assessed: Patient Learning Preferences/Education Level/Primary Language Learning Preference: Explanation Highest Education Level: College or Above Preferred Language: English Cognitive Barrier Language Barrier: No Translator Needed: No Memory Deficit: No Emotional Barrier: No Cultural/Religious Beliefs Affecting Medical Care: No Knowledge/Comprehension Knowledge Level: High Comprehension Level: High Ability to understand written instructions: High Ability to understand verbal instructions: High Motivation Anxiety Level: Calm Cooperation: Cooperative Education Importance: Acknowledges Need Interest in Health Problems: Asks Questions Perception: Coherent Willingness to Engage in Self-Management High Activities: Readiness to Engage in Self-Management High Activities: Engineer, maintenance) Signed: 09/23/2019 4:45:00 PM By: Gretta Cool, BSN, RN, CWS, Kim RN, BSN Entered By: Gretta Cool, BSN, RN, CWS, Kim on 09/23/2019 14:55:44 Karen Hooper ([id number]) -------------------------------------------------------------------------------- Fall Risk Assessment Details Patient Name: Karen Hooper Date of Service: 09/23/2019 2:15 PM Medical Record Number: [id number] Patient Account Number: [id number] Date of Birth/Sex: 1931-10-23 (87 y.o. F) Treating RN: Cornell Barman Primary Care Rachard Isidro: Otilio Miu  Other Clinician: Referring Mame Twombly: Otilio Miu Treating Jimma Ortman/Extender: Melburn Hake, HOYT Weeks in Treatment: 0 Fall Risk Assessment Items Have you had 2 or more falls in the last 12 monthso 0 No Have you had any fall that resulted in injury in the last 12 monthso 0 No FALLS RISK SCREEN History of falling - immediate or within 3 months 0 No Secondary diagnosis (Do you have 2 or more medical diagnoseso) 0 No Ambulatory aid None/bed rest/wheelchair/nurse 0 Yes Crutches/cane/walker 0 No Furniture 0 No Intravenous therapy Access/Saline/Heparin [employer]  0 No Gait/Transferring Normal/ bed rest/ wheelchair 0 Yes Weak (short steps with or without shuffle, stooped but able to lift head while walking, may seek 0 No support from furniture) Impaired (short steps with shuffle, may have difficulty arising from chair, head down, impaired 0 No balance) Mental Status Oriented to own ability 0 Yes Electronic Signature(s) Signed: 09/23/2019 4:45:00 PM By: Elliot Gurney, BSN, RN, CWS, Kim RN, BSN Entered By: Elliot Gurney, BSN, RN, CWS, Kim on 09/23/2019 14:56:00 Karen Hooper ([id number]) -------------------------------------------------------------------------------- Foot Assessment Details Patient Name: Karen Hooper Date of Service: 09/23/2019 2:15 PM Medical Record Number: [id number] Patient Account Number: [id number] Date of Birth/Sex: 02/21/1932 (87 y.o. F) Treating RN: Huel Coventry Primary Care Shellby Schlink: Elizabeth Sauer Other Clinician: Referring Royale Lennartz: Elizabeth Sauer Treating Trigo Winterbottom/Extender: STONE III, HOYT Weeks in Treatment: 0 Foot Assessment Items Site Locations + = Sensation present, - = Sensation absent, C = Callus, U = Ulcer R = Redness, W = Warmth, M = Maceration, PU = Pre-ulcerative lesion F = Fissure, S = Swelling, D = Dryness Assessment Right: Left: Other Deformity: No No Prior Foot Ulcer: No No Prior Amputation: No No Charcot Joint: No No Ambulatory Status: Ambulatory Without  Help Gait: Steady Electronic Signature(s) Signed: 09/23/2019 4:45:00 PM By: Elliot Gurney, BSN, RN, CWS, Kim RN, BSN Entered By: Elliot Gurney, BSN, RN, CWS, Kim on 09/23/2019 14:56:16 Karen Hooper ([id number]) -------------------------------------------------------------------------------- Nutrition Risk Screening Details Patient Name: Karen Hooper Date of Service: 09/23/2019 2:15 PM Medical Record Number: [id number] Patient Account Number: [id number] Date of Birth/Sex: 1931-08-25 (87 y.o. F) Treating RN: Huel Coventry Primary Care Avagrace Botelho: Elizabeth Sauer Other Clinician: Referring Evaleen Sant: Elizabeth Sauer Treating Kourtni Stineman/Extender: STONE III, HOYT Weeks in Treatment: 0 Height (in): 64 Weight (lbs): 156 Body Mass Index (BMI): 26.8 Nutrition Risk Screening Items Score Screening NUTRITION RISK SCREEN: I have an illness or condition that made me change the kind and/or amount of food I eat 0 No I eat fewer than two meals per day 0 No I eat few fruits and vegetables, or milk products 0 No I have three or more drinks of beer, liquor or wine almost every day 0 No I have tooth or mouth problems that make it hard for me to eat 0 No I don't always have enough money to buy the food I need 0 No I eat alone most of the time 0 No I take three or more different prescribed or over-the-counter drugs a day 0 No Without wanting to, I have lost or gained 10 pounds in the last six months 0 No I am not always physically able to shop, cook and/or feed myself 0 No Nutrition Protocols Good Risk Protocol 0 No interventions needed Moderate Risk Protocol High Risk Proctocol Risk Level: Good Risk Score: 0 Electronic Signature(s) Signed: 09/23/2019 4:45:00 PM By: Elliot Gurney, BSN, RN, CWS, Kim RN, BSN Entered By: Elliot Gurney, BSN, RN, CWS, Kim on 09/23/2019 14:56:07

## 2019-09-30 ENCOUNTER — Other Ambulatory Visit: Payer: Self-pay

## 2019-09-30 ENCOUNTER — Encounter: Payer: Medicare Other | Admitting: Physician Assistant

## 2019-09-30 DIAGNOSIS — I10 Essential (primary) hypertension: Secondary | ICD-10-CM | POA: Diagnosis not present

## 2019-09-30 DIAGNOSIS — S8011XA Contusion of right lower leg, initial encounter: Secondary | ICD-10-CM | POA: Diagnosis not present

## 2019-09-30 DIAGNOSIS — S81801A Unspecified open wound, right lower leg, initial encounter: Secondary | ICD-10-CM | POA: Diagnosis not present

## 2019-09-30 DIAGNOSIS — M1990 Unspecified osteoarthritis, unspecified site: Secondary | ICD-10-CM | POA: Diagnosis not present

## 2019-09-30 NOTE — Progress Notes (Signed)
Karen Hooper, Karen Hooper ([id number]) Visit Report for 09/30/2019 Arrival Information Details Patient Name: Karen Hooper, Karen Hooper Date of Service: 09/30/2019 3:00 PM Medical Record Number: [id number] Patient Account Number: [id number] Date of Birth/Sex: 05-30-1932 (86 y.o. F) Treating RN: Cornell Barman Primary Care Marton Malizia: Otilio Miu Other Clinician: Referring Liban Guedes: Otilio Miu Treating Dain Laseter/Extender: Melburn Hake, HOYT Weeks in Treatment: 1 Visit Information History Since Last Visit Has Dressing in Place as Prescribed: Yes Patient Arrived: Ambulatory Pain Present Now: No Arrival Time: 14:53 Accompanied By: husband Transfer Assistance: None Patient Identification Verified: Yes Secondary Verification Process Completed: Yes Patient Requires Transmission-Based Precautions: No Patient Has Alerts: No Electronic Signature(s) Signed: 09/30/2019 5:11:36 PM By: Gretta Cool, BSN, RN, CWS, Kim RN, BSN Entered By: Gretta Cool, BSN, RN, CWS, Kim on 09/30/2019 14:53:20 Karen Hooper ([id number]) -------------------------------------------------------------------------------- Encounter Discharge Information Details Patient Name: Karen Hooper Date of Service: 09/30/2019 3:00 PM Medical Record Number: [id number] Patient Account Number: [id number] Date of Birth/Sex: [birth year] (86 y.o. F) Treating RN: Army Melia Primary Care Refael Fulop: Otilio Miu Other Clinician: Referring Erasto Sleight: Otilio Miu Treating Zainab Crumrine/Extender: Melburn Hake, HOYT Weeks in Treatment: 1 Encounter Discharge Information Items Post Procedure Vitals Discharge Condition: Stable Temperature (F): 98.3 Ambulatory Status: Ambulatory Pulse (bpm): 67 Discharge Destination: Home Respiratory Rate (breaths/min): 16 Transportation: Private Auto Blood Pressure (mmHg): 147/50 Accompanied By: husband Schedule Follow-up Appointment: Yes Clinical Summary of Care: Electronic Signature(s) Signed: 09/30/2019 4:14:01 PM By: Army Melia Entered By:  Army Melia on 09/30/2019 15:37:07 Karen Hooper ([id number]) -------------------------------------------------------------------------------- Lower Extremity Assessment Details Patient Name: Karen Hooper Date of Service: 09/30/2019 3:00 PM Medical Record Number: [id number] Patient Account Number: [id number] Date of Birth/Sex: 06-Oct-1931 (86 y.o. F) Treating RN: Cornell Barman Primary Care Dewitte Vannice: Otilio Miu Other Clinician: Referring Enslie Sahota: Otilio Miu Treating Shantese Raven/Extender: Melburn Hake, HOYT Weeks in Treatment: 1 Vascular Assessment Pulses: Dorsalis Pedis Palpable: [Right:Yes] Electronic Signature(s) Signed: 09/30/2019 5:11:36 PM By: Gretta Cool, BSN, RN, CWS, Kim RN, BSN Entered By: Gretta Cool, BSN, RN, CWS, Kim on 09/30/2019 15:00:05 Karen Hooper ([id number]) -------------------------------------------------------------------------------- Multi Wound Chart Details Patient Name: Karen Hooper Date of Service: 09/30/2019 3:00 PM Medical Record Number: [id number] Patient Account Number: [id number] Date of Birth/Sex: 10/26/1931 (86 y.o. F) Treating RN: Army Melia Primary Care Izack Hoogland: Otilio Miu Other Clinician: Referring Taquana Bartley: Otilio Miu Treating Cory Rama/Extender: STONE III, HOYT Weeks in Treatment: 1 Vital Signs Height(in): 64 Pulse(bpm): 78 Weight(lbs): 156 Blood Pressure(mmHg): 147/50 Body Mass Index(BMI): 27 Temperature(F): 98.3 Respiratory Rate(breaths/min): 16 Photos: [N/A:N/A] Wound Location: Right Lower Leg N/A N/A Wounding Event: Trauma N/A N/A Primary Etiology: Trauma, Other N/A N/A Comorbid History: Hypertension, Osteoarthritis N/A N/A Date Acquired: 08/26/2019 N/A N/A Weeks of Treatment: 1 N/A N/A Wound Status: Open N/A N/A Measurements L x W x D (cm) 1.6x1.5x0.1 N/A N/A Area (cm) : 1.885 N/A N/A Volume (cm) : 0.188 N/A N/A % Reduction in Area: 25.00% N/A N/A % Reduction in Volume: 25.10% N/A N/A Classification: Full Thickness Without  Exposed N/A N/A Support Structures Exudate Amount: Medium N/A N/A Exudate Type: Serous N/A N/A Exudate Color: amber N/A N/A Wound Margin: Flat and Intact N/A N/A Granulation Amount: Medium (34-66%) N/A N/A Granulation Quality: Red, Hyper-granulation N/A N/A Necrotic Amount: Medium (34-66%) N/A N/A Exposed Structures: Fat Layer (Subcutaneous Tissue) N/A N/A Exposed: Yes Fascia: No Tendon: No Muscle: No Joint: No Bone: No Epithelialization: None N/A N/A Treatment Notes Electronic Signature(s) Signed: 09/30/2019 4:14:01 PM By: Army Melia Entered By: Army Melia on 09/30/2019 15:32:45 Karen Hooper ([id number]) Karen Hooper, Karen W. ([id number]) --------------------------------------------------------------------------------  Multi-Disciplinary Care Plan Details Patient Name: Karen Hooper, Karen Hooper Date of Service: 09/30/2019 3:00 PM Medical Record Number: [id number] Patient Account Number: [id number] Date of Birth/Sex: 1932-03-25 (86 y.o. F) Treating RN: Rodell Perna Primary Care Luman Holway: Elizabeth Sauer Other Clinician: Referring Bond Grieshop: Elizabeth Sauer Treating Margarito Dehaas/Extender: Linwood Dibbles, HOYT Weeks in Treatment: 1 Active Inactive Necrotic Tissue Nursing Diagnoses: Impaired tissue integrity related to necrotic/devitalized tissue Knowledge deficit related to management of necrotic/devitalized tissue Goals: Necrotic/devitalized tissue will be minimized in the wound bed Date Initiated: 09/23/2019 Target Resolution Date: 10/07/2019 Goal Status: Active Interventions: Assess patient pain level pre-, during and post procedure and prior to discharge Treatment Activities: Apply topical anesthetic as ordered : 09/23/2019 Excisional debridement : 09/23/2019 Notes: Orientation to the Wound Care Program Nursing Diagnoses: Knowledge deficit related to the wound healing center program Goals: Patient/caregiver will verbalize understanding of the Wound Healing Center Program Date Initiated:  09/23/2019 Target Resolution Date: 10/08/2019 Goal Status: Active Interventions: Provide education on orientation to the wound center Notes: Wound/Skin Impairment Nursing Diagnoses: Impaired tissue integrity Goals: Ulcer/skin breakdown will have a volume reduction of 30% by week 4 Date Initiated: 09/23/2019 Target Resolution Date: 10/24/2019 Goal Status: Active Interventions: Assess ulceration(s) every visit Treatment Activities: Skin care regimen initiated : 09/23/2019 CLORINDA, WYBLE ([id number]) Notes: Electronic Signature(s) Signed: 09/30/2019 4:14:01 PM By: Rodell Perna Entered By: Rodell Perna on 09/30/2019 15:32:33 Karen Hooper ([id number]) -------------------------------------------------------------------------------- Pain Assessment Details Patient Name: Karen Hooper Date of Service: 09/30/2019 3:00 PM Medical Record Number: [id number] Patient Account Number: [id number] Date of Birth/Sex: [birth year] (86 y.o. F) Treating RN: Huel Coventry Primary Care Jhonny Calixto: Elizabeth Sauer Other Clinician: Referring Linas Stepter: Elizabeth Sauer Treating Carlei Huang/Extender: Linwood Dibbles, HOYT Weeks in Treatment: 1 Active Problems Location of Pain Severity and Description of Pain Patient Has Paino No Site Locations Pain Management and Medication Current Pain Management: Notes Patient denies pain at this time. Electronic Signature(s) Signed: 09/30/2019 5:11:36 PM By: Elliot Gurney, BSN, RN, CWS, Kim RN, BSN Entered By: Elliot Gurney, BSN, RN, CWS, Kim on 09/30/2019 14:57:23 Karen Hooper ([id number]) -------------------------------------------------------------------------------- Patient/Caregiver Education Details Patient Name: Karen Hooper Date of Service: 09/30/2019 3:00 PM Medical Record Number: [id number] Patient Account Number: [id number] Date of Birth/Gender: [birth year] (87 y.o. F) Treating RN: Rodell Perna Primary Care Physician: Elizabeth Sauer Other Clinician: Referring Physician: Elizabeth Sauer Treating Physician/Extender: Linwood Dibbles, HOYT Weeks in Treatment: 1 Education Assessment Education Provided To: Patient Education Topics Provided Wound/Skin Impairment: Handouts: Caring for Your Ulcer Methods: Demonstration, Explain/Verbal Responses: State content correctly Electronic Signature(s) Signed: 09/30/2019 4:14:01 PM By: Rodell Perna Entered By: Rodell Perna on 09/30/2019 15:36:15 Karen Hooper ([id number]) -------------------------------------------------------------------------------- Wound Assessment Details Patient Name: Karen Hooper Date of Service: 09/30/2019 3:00 PM Medical Record Number: [id number] Patient Account Number: [id number] Date of Birth/Sex: 1931-07-30 (87 y.o. F) Treating RN: Huel Coventry Primary Care Jasiyah Paulding: Elizabeth Sauer Other Clinician: Referring Alira Fretwell: Elizabeth Sauer Treating Madinah Quarry/Extender: STONE III, HOYT Weeks in Treatment: 1 Wound Status Wound Number: 1 Primary Etiology: Trauma, Other Wound Location: Right Lower Leg Wound Status: Open Wounding Event: Trauma Comorbid History: Hypertension, Osteoarthritis Date Acquired: 08/26/2019 Weeks Of Treatment: 1 Clustered Wound: No Photos Wound Measurements Length: (cm) 1.6 Width: (cm) 1.5 Depth: (cm) 0.1 Area: (cm) 1.885 Volume: (cm) 0.188 % Reduction in Area: 25% % Reduction in Volume: 25.1% Epithelialization: None Tunneling: No Undermining: No Wound Description Classification: Full Thickness Without Exposed Support Structures Wound Margin: Flat and Intact Exudate Amount: Medium Exudate Type: Serous Exudate Color: amber Foul Odor After Cleansing: No  Slough/Fibrino Yes Wound Bed Granulation Amount: Medium (34-66%) Exposed Structure Granulation Quality: Red, Hyper-granulation Fascia Exposed: No Necrotic Amount: Medium (34-66%) Fat Layer (Subcutaneous Tissue) Exposed: Yes Necrotic Quality: Adherent Slough Tendon Exposed: No Muscle Exposed: No Joint Exposed: No Bone  Exposed: No Treatment Notes Wound #1 (Right Lower Leg) Notes H blue, conform tape stretch net Electronic Signature(s) Signed: 09/30/2019 5:11:36 PM By: Elliot Gurney, BSN, RN, CWS, Kim RN, BSN [place], [place] ([id number]) Entered By: Elliot Gurney, BSN, RN, CWS, Kim on 09/30/2019 14:58:43 Karen Hooper ([id number]) -------------------------------------------------------------------------------- Vitals Details Patient Name: Karen Hooper Date of Service: 09/30/2019 3:00 PM Medical Record Number: [id number] Patient Account Number: [id number] Date of Birth/Sex: [birth year] (87 y.o. F) Treating RN: Huel Coventry Primary Care Ruchel Brandenburger: Elizabeth Sauer Other Clinician: Referring Niveah Boerner: Elizabeth Sauer Treating Karysa Heft/Extender: STONE III, HOYT Weeks in Treatment: 1 Vital Signs Time Taken: 14:53 Temperature (F): 98.3 Height (in): 64 Pulse (bpm): 67 Weight (lbs): 156 Respiratory Rate (breaths/min): 16 Body Mass Index (BMI): 26.8 Blood Pressure (mmHg): 147/50 Reference Range: 80 - 120 mg / dl Electronic Signature(s) Signed: 09/30/2019 5:11:36 PM By: Elliot Gurney, BSN, RN, CWS, Kim RN, BSN Entered By: Elliot Gurney, BSN, RN, CWS, Kim on 09/30/2019 14:54:25

## 2019-09-30 NOTE — Progress Notes (Addendum)
Karen Hooper, Karen Hooper ([id number]) Visit Report for 09/30/2019 Chief Complaint Document Details Patient Name: ALAUNA, Karen Hooper Date of Service: 09/30/2019 3:00 PM Medical Record Number: [id number] Patient Account Number: [id number] Date of Birth/Sex: 02/10/1932 (86 y.o. F) Treating RN: Rodell Perna Primary Care Provider: Elizabeth Sauer Other Clinician: Referring Provider: Elizabeth Sauer Treating Provider/Extender: Linwood Dibbles, Simonne Boulos Weeks in Treatment: 1 Information Obtained from: Patient Chief Complaint Right leg hematoma and ulcer Electronic Signature(s) Signed: 09/30/2019 3:18:53 PM By: Lenda Kelp PA-C Entered By: Lenda Kelp on 09/30/2019 15:18:52 Karen Hooper ([id number]) -------------------------------------------------------------------------------- Debridement Details Patient Name: Karen Hooper Date of Service: 09/30/2019 3:00 PM Medical Record Number: [id number] Patient Account Number: [id number] Date of Birth/Sex: 07-Jan-1932 (86 y.o. F) Treating RN: Rodell Perna Primary Care Provider: Elizabeth Sauer Other Clinician: Referring Provider: Elizabeth Sauer Treating Provider/Extender: Linwood Dibbles, Kasey Ewings Weeks in Treatment: 1 Debridement Performed for Wound #1 Right Lower Leg Assessment: Performed By: Physician STONE III, Chantell Kunkler E., PA-C Debridement Type: Debridement Level of Consciousness (Pre- Awake and Alert procedure): Pre-procedure Verification/Time Out Yes - 15:33 Taken: Start Time: 15:34 Pain Control: Lidocaine Total Area Debrided (L x W): 1.6 (cm) x 1.5 (cm) = 2.4 (cm) Tissue and other material debrided: Viable, Non-Viable, Slough, Subcutaneous, Slough Level: Skin/Subcutaneous Tissue Debridement Description: Excisional Instrument: Curette Bleeding: Minimum Hemostasis Achieved: Pressure End Time: 15:35 Response to Treatment: Procedure was tolerated well Level of Consciousness (Post- Awake and Alert procedure): Post Debridement Measurements of Total Wound Length: (cm)  1.6 Width: (cm) 1.5 Depth: (cm) 0.1 Volume: (cm) 0.188 Character of Wound/Ulcer Post Debridement: Stable Post Procedure Diagnosis Same as Pre-procedure Electronic Signature(s) Signed: 09/30/2019 4:14:01 PM By: Rodell Perna Signed: 09/30/2019 4:32:11 PM By: Lenda Kelp PA-C Entered By: Rodell Perna on 09/30/2019 15:35:03 Karen Hooper ([id number]) -------------------------------------------------------------------------------- HPI Details Patient Name: Karen Hooper Date of Service: 09/30/2019 3:00 PM Medical Record Number: [id number] Patient Account Number: [id number] Date of Birth/Sex: [birth year] (86 y.o. F) Treating RN: Rodell Perna Primary Care Provider: Elizabeth Sauer Other Clinician: Referring Provider: Elizabeth Sauer Treating Provider/Extender: Linwood Dibbles, Ozro Russett Weeks in Treatment: 1 History of Present Illness HPI Description: 09/23/2019 upon evaluation today patient presents for initial inspection here in our clinic concerning issues that she has been having with her right anterior lower extremity. She actually sustained a traumatic wound when she hit her leg on the handle for her recliner roughly 1 month ago. She had a hematoma but initially never went to see anyone about this. Subsequently she then ended up continuing to have issues to the point that her pastor came by and told her that he really felt like she should go get this checked out at the doctor's office. She then subsequently did go in to see her primary care provider who debrided this away this past Friday for her. Subsequently we were then able to work her in for today. Nonetheless overall the patient does seem to be making some progress. She does have a history of hypertension but other than this really has no major medical problems and appears to be fairly healthy. 09/30/2019 upon evaluation today patient appears to be doing very well with regard to her wound. This is making good progress at this point  [facility] has been utilized and overall seems to be doing excellent for her. I do think that she probably does not need any additional antibiotics at this point based on what I am seeing overall I think she is doing quite nicely. Electronic Signature(s) Signed: 09/30/2019 3:55:54 PM  By: Lenda Kelp PA-C Entered By: Lenda Kelp on 09/30/2019 15:55:53 Karen Hooper ([id number]) -------------------------------------------------------------------------------- Physical Exam Details Patient Name: Karen Hooper Date of Service: 09/30/2019 3:00 PM Medical Record Number: [id number] Patient Account Number: [id number] Date of Birth/Sex: [birth year] (86 y.o. F) Treating RN: Rodell Perna Primary Care Provider: Elizabeth Sauer Other Clinician: Referring Provider: Elizabeth Sauer Treating Provider/Extender: STONE III, Alexsis Kathman Weeks in Treatment: 1 Constitutional Well-nourished and well-hydrated in no acute distress. Respiratory normal breathing without difficulty. Psychiatric this patient is able to make decisions and demonstrates good insight into disease process. Alert and Oriented x 3. pleasant and cooperative. Notes Patient's wound bed currently showed signs of good granulation at this time. Fortunately there is no evidence of active infection and overall the patient is doing quite well with regard to her wounds. No fevers, chills, nausea, vomiting, or diarrhea. Electronic Signature(s) Signed: 09/30/2019 3:56:21 PM By: Lenda Kelp PA-C Entered By: Lenda Kelp on 09/30/2019 15:56:20 Karen Hooper ([id number]) -------------------------------------------------------------------------------- Physician Orders Details Patient Name: Karen Hooper Date of Service: 09/30/2019 3:00 PM Medical Record Number: [id number] Patient Account Number: [id number] Date of Birth/Sex: 02/25/1932 (86 y.o. F) Treating RN: Rodell Perna Primary Care Provider: Elizabeth Sauer Other Clinician: Referring  Provider: Elizabeth Sauer Treating Provider/Extender: Linwood Dibbles, Unknown Schleyer Weeks in Treatment: 1 Verbal / Phone Orders: No Diagnosis Coding ICD-10 Coding Code Description S80.11XA Contusion of right lower leg, initial encounter S81.801A Unspecified open wound, right lower leg, initial encounter I10 Essential (primary) hypertension Wound Cleansing Wound #1 Right Lower Leg o Dial antibacterial soap, wash wounds, rinse and pat dry prior to dressing wounds Primary Wound Dressing Wound #1 Right Lower Leg o Hydrafera Blue Ready Transfer Secondary Dressing Wound #1 Right Lower Leg o Conform/Kerlix Dressing Change Frequency Wound #1 Right Lower Leg o Change Dressing Monday, Wednesday, Friday Follow-up Appointments Wound #1 Right Lower Leg o Return Appointment in 1 week. Edema Control Wound #1 Right Lower Leg o Elevate legs to the level of the heart and pump ankles as often as possible Additional Orders / Instructions Wound #1 Right Lower Leg o Increase protein intake. o Activity as tolerated Medications-please add to medication list. Wound #1 Right Lower Leg o P.O. Antibiotics - continue antibiotics prescribed by PCP Electronic Signature(s) Signed: 09/30/2019 4:14:01 PM By: Rodell Perna Signed: 09/30/2019 4:32:11 PM By: Lenda Kelp PA-C Entered By: Rodell Perna on 09/30/2019 15:35:29 Karen Hooper ([id number]) -------------------------------------------------------------------------------- Problem List Details Patient Name: Karen Hooper Date of Service: 09/30/2019 3:00 PM Medical Record Number: [id number] Patient Account Number: [id number] Date of Birth/Sex: Nov 16, 1931 (86 y.o. F) Treating RN: Rodell Perna Primary Care Provider: Elizabeth Sauer Other Clinician: Referring Provider: Elizabeth Sauer Treating Provider/Extender: Linwood Dibbles, Mivaan Corbitt Weeks in Treatment: 1 Active Problems ICD-10 Evaluated Encounter Code Description Active Date Today Diagnosis S80.11XA  Contusion of right lower leg, initial encounter 09/23/2019 No Yes S81.801A Unspecified open wound, right lower leg, initial encounter 09/23/2019 No Yes I10 Essential (primary) hypertension 09/23/2019 No Yes Inactive Problems Resolved Problems Electronic Signature(s) Signed: 09/30/2019 3:18:38 PM By: Lenda Kelp PA-C Entered By: Lenda Kelp on 09/30/2019 15:18:38 Karen Hooper ([id number]) -------------------------------------------------------------------------------- Progress Note Details Patient Name: Karen Hooper Date of Service: 09/30/2019 3:00 PM Medical Record Number: [id number] Patient Account Number: [id number] Date of Birth/Sex: October 02, 1931 (86 y.o. F) Treating RN: Rodell Perna Primary Care Provider: Elizabeth Sauer Other Clinician: Referring Provider: Elizabeth Sauer Treating Provider/Extender: Linwood Dibbles, Shaylin Blatt Weeks in Treatment: 1 Subjective Chief Complaint Information obtained from Patient  Right leg hematoma and ulcer History of Present Illness (HPI) 09/23/2019 upon evaluation today patient presents for initial inspection here in our clinic concerning issues that she has been having with her right anterior lower extremity. She actually sustained a traumatic wound when she hit her leg on the handle for her recliner roughly 1 month ago. She had a hematoma but initially never went to see anyone about this. Subsequently she then ended up continuing to have issues to the point that her pastor came by and told her that he really felt like she should go get this checked out at the doctor's office. She then subsequently did go in to see her primary care provider who debrided this away this past Friday for her. Subsequently we were then able to work her in for today. Nonetheless overall the patient does seem to be making some progress. She does have a history of hypertension but other than this really has no major medical problems and appears to be fairly healthy. 09/30/2019 upon  evaluation today patient appears to be doing very well with regard to her wound. This is making good progress at this point [facility] has been utilized and overall seems to be doing excellent for her. I do think that she probably does not need any additional antibiotics at this point based on what I am seeing overall I think she is doing quite nicely. Objective Constitutional Well-nourished and well-hydrated in no acute distress. Vitals Time Taken: 2:53 PM, Height: 64 in, Weight: 156 lbs, BMI: 26.8, Temperature: 98.3 F, Pulse: 67 bpm, Respiratory Rate: 16 breaths/min, Blood Pressure: 147/50 mmHg. Respiratory normal breathing without difficulty. Psychiatric this patient is able to make decisions and demonstrates good insight into disease process. Alert and Oriented x 3. pleasant and cooperative. General Notes: Patient's wound bed currently showed signs of good granulation at this time. Fortunately there is no evidence of active infection and overall the patient is doing quite well with regard to her wounds. No fevers, chills, nausea, vomiting, or diarrhea. Integumentary (Hair, Skin) Wound #1 status is Open. Original cause of wound was Trauma. The wound is located on the Right Lower Leg. The wound measures 1.6cm length x 1.5cm width x 0.1cm depth; 1.885cm^2 area and 0.188cm^3 volume. There is Fat Layer (Subcutaneous Tissue) Exposed exposed. There is no tunneling or undermining noted. There is a medium amount of serous drainage noted. The wound margin is flat and intact. There is medium (34-66%) red, hyper - granulation within the wound bed. There is a medium (34-66%) amount of necrotic tissue within the wound bed including Adherent Slough. Assessment Karen Hooper, Karen Hooper ([id number]) Active Problems ICD-10 Contusion of right lower leg, initial encounter Unspecified open wound, right lower leg, initial encounter Essential (primary) hypertension Procedures Wound #1 Pre-procedure diagnosis of  Wound #1 is a Trauma, Other located on the Right Lower Leg . There was a Excisional Skin/Subcutaneous Tissue Debridement with a total area of 2.4 sq cm performed by STONE III, Aalyah Mansouri E., PA-C. With the following instrument(s): Curette to remove Viable and Non-Viable tissue/material. Material removed includes Subcutaneous Tissue and Slough and after achieving pain control using Lidocaine. A time out was conducted at 15:33, prior to the start of the procedure. A Minimum amount of bleeding was controlled with Pressure. The procedure was tolerated well. Post Debridement Measurements: 1.6cm length x 1.5cm width x 0.1cm depth; 0.188cm^3 volume. Character of Wound/Ulcer Post Debridement is stable. Post procedure Diagnosis Wound #1: Same as Pre-Procedure Plan Wound Cleansing: Wound #1 Right Lower  Leg: Dial antibacterial soap, wash wounds, rinse and pat dry prior to dressing wounds Primary Wound Dressing: Wound #1 Right Lower Leg: Hydrafera Blue Ready Transfer Secondary Dressing: Wound #1 Right Lower Leg: Conform/Kerlix Dressing Change Frequency: Wound #1 Right Lower Leg: Change Dressing Monday, Wednesday, Friday Follow-up Appointments: Wound #1 Right Lower Leg: Return Appointment in 1 week. Edema Control: Wound #1 Right Lower Leg: Elevate legs to the level of the heart and pump ankles as often as possible Additional Orders / Instructions: Wound #1 Right Lower Leg: Increase protein intake. Activity as tolerated Medications-please add to medication list.: Wound #1 Right Lower Leg: P.O. Antibiotics - continue antibiotics prescribed by PCP 1. I would recommend currently that we go ahead and continue with the [facility] dressing I still think this is probably the best thing for her and the patient is in agreement with that plan. 2. I am also can recommend that we go ahead and continue with the rolled gauze to secure this in place again the patient seems to be doing very well with this. 3. I  am also going to recommend that at this point I think she can discontinue the antibiotic therapy I do not think she needs any additional antibiotics at this point which is good news I think she is done with what her primary care provider had prescribed for her. We will see patient back for reevaluation in 1 week here in the clinic. If anything worsens or changes patient will contact our office for additional recommendations. Karen Hooper, Karen Hooper ([id number]) Electronic Signature(s) Signed: 09/30/2019 3:57:07 PM By: Lenda Kelp PA-C Entered By: Lenda Kelp on 09/30/2019 15:57:06 Karen Hooper ([id number]) -------------------------------------------------------------------------------- SuperBill Details Patient Name: Karen Hooper Date of Service: 09/30/2019 Medical Record Number: [id number] Patient Account Number: [id number] Date of Birth/Sex: 11/12/1931 (86 y.o. F) Treating RN: Rodell Perna Primary Care Provider: Elizabeth Sauer Other Clinician: Referring Provider: Elizabeth Sauer Treating Provider/Extender: STONE III, Smokey Melott Weeks in Treatment: 1 Diagnosis Coding ICD-10 Codes Code Description S80.11XA Contusion of right lower leg, initial encounter S81.801A Unspecified open wound, right lower leg, initial encounter I10 Essential (primary) hypertension Facility Procedures CPT4 Code: [id number] Description: 11042 - DEB SUBQ TISSUE 20 SQ CM/< Modifier: Quantity: 1 CPT4 Code: Description: ICD-10 Diagnosis Description S81.801A Unspecified open wound, right lower leg, initial encounter S80.11XA Contusion of right lower leg, initial encounter Modifier: Quantity: Physician Procedures CPT4 Code: [id number] Description: 11042 - WC PHYS SUBQ TISS 20 SQ CM Modifier: Quantity: 1 CPT4 Code: Description: ICD-10 Diagnosis Description S81.801A Unspecified open wound, right lower leg, initial encounter S80.11XA Contusion of right lower leg, initial encounter Modifier: Quantity: Electronic  Signature(s) Signed: 09/30/2019 3:57:31 PM By: Lenda Kelp PA-C Entered By: Lenda Kelp on 09/30/2019 15:57:31

## 2019-10-07 ENCOUNTER — Encounter: Payer: Medicare Other | Admitting: Physician Assistant

## 2019-10-07 ENCOUNTER — Other Ambulatory Visit: Payer: Self-pay

## 2019-10-07 DIAGNOSIS — S81801A Unspecified open wound, right lower leg, initial encounter: Secondary | ICD-10-CM | POA: Diagnosis not present

## 2019-10-07 DIAGNOSIS — S8011XA Contusion of right lower leg, initial encounter: Secondary | ICD-10-CM | POA: Diagnosis not present

## 2019-10-07 DIAGNOSIS — I10 Essential (primary) hypertension: Secondary | ICD-10-CM | POA: Diagnosis not present

## 2019-10-07 DIAGNOSIS — M1990 Unspecified osteoarthritis, unspecified site: Secondary | ICD-10-CM | POA: Diagnosis not present

## 2019-10-07 DIAGNOSIS — L97812 Non-pressure chronic ulcer of other part of right lower leg with fat layer exposed: Secondary | ICD-10-CM | POA: Diagnosis not present

## 2019-10-07 NOTE — Progress Notes (Signed)
Karen Hooper, Karen Hooper ([id number]) Visit Report for 10/07/2019 Arrival Information Details Patient Name: Karen Hooper, Karen Hooper Date of Service: 10/07/2019 3:15 PM Medical Record Number: [id number] Patient Account Number: [id number] Date of Birth/Sex: 06-01-1932 (86 y.o. F) Treating RN: Rodell Perna Primary Care Tabitha Tupper: Elizabeth Sauer Other Clinician: Referring Randal Goens: Elizabeth Sauer Treating Natalyn Szymanowski/Extender: Linwood Dibbles, HOYT Weeks in Treatment: 2 Visit Information History Since Last Visit Added or deleted any medications: No Patient Arrived: Ambulatory Any new allergies or adverse reactions: No Arrival Time: 14:52 Had a fall or experienced change in No Accompanied By: husband activities of daily living that may affect Transfer Assistance: None risk of falls: Patient Identification Verified: Yes Signs or symptoms of abuse/neglect since last visito No Secondary Verification Process Completed: Yes Hospitalized since last visit: No Patient Requires Transmission-Based Precautions: No Implantable device outside of the clinic excluding No Patient Has Alerts: No cellular tissue based products placed in the center since last visit: Has Dressing in Place as Prescribed: Yes Pain Present Now: No Electronic Signature(s) Signed: 10/07/2019 4:19:28 PM By: Dayton Martes RCP, RRT, CHT Entered By: Dayton Martes on 10/07/2019 14:54:29 Karen Hooper ([id number]) -------------------------------------------------------------------------------- Encounter Discharge Information Details Patient Name: Karen Hooper Date of Service: 10/07/2019 3:15 PM Medical Record Number: [id number] Patient Account Number: [id number] Date of Birth/Sex: September 17, 1931 (87 y.o. F) Treating RN: Rodell Perna Primary Care Dilan Fullenwider: Elizabeth Sauer Other Clinician: Referring Annaclaire Walsworth: Elizabeth Sauer Treating Niyana Chesbro/Extender: Linwood Dibbles, HOYT Weeks in Treatment: 2 Encounter Discharge Information Items Post  Procedure Vitals Discharge Condition: Stable Temperature (F): 97.7 Ambulatory Status: Ambulatory Pulse (bpm): 59 Discharge Destination: Home Respiratory Rate (breaths/min): 16 Transportation: Private Auto Blood Pressure (mmHg): 113/45 Accompanied By: husband Schedule Follow-up Appointment: Yes Clinical Summary of Care: Electronic Signature(s) Signed: 10/07/2019 4:04:00 PM By: Rodell Perna Entered By: Rodell Perna on 10/07/2019 15:26:09 Karen Hooper ([id number]) -------------------------------------------------------------------------------- Lower Extremity Assessment Details Patient Name: Karen Hooper Date of Service: 10/07/2019 3:15 PM Medical Record Number: [id number] Patient Account Number: [id number] Date of Birth/Sex: [birth year] (87 y.o. F) Treating RN: Curtis Sites Primary Care Jamesa Tedrick: Elizabeth Sauer Other Clinician: Referring Tasean Mancha: Elizabeth Sauer Treating Nhan Qualley/Extender: STONE III, HOYT Weeks in Treatment: 2 Edema Assessment Assessed: [Left: No] [Right: No] Edema: [Left: N] [Right: o] Vascular Assessment Pulses: Dorsalis Pedis Palpable: [Right:Yes] Electronic Signature(s) Signed: 10/07/2019 4:17:26 PM By: Curtis Sites Entered By: Curtis Sites on 10/07/2019 15:07:56 Karen Hooper ([id number]) -------------------------------------------------------------------------------- Multi Wound Chart Details Patient Name: Karen Hooper Date of Service: 10/07/2019 3:15 PM Medical Record Number: [id number] Patient Account Number: [id number] Date of Birth/Sex: 1932-02-13 (86 y.o. F) Treating RN: Rodell Perna Primary Care Cecile Guevara: Elizabeth Sauer Other Clinician: Referring Cosimo Schertzer: Elizabeth Sauer Treating Landyn Lorincz/Extender: STONE III, HOYT Weeks in Treatment: 2 Vital Signs Height(in): 64 Pulse(bpm): 59 Weight(lbs): 156 Blood Pressure(mmHg): 113/45 Body Mass Index(BMI): 27 Temperature(F): 97.7 Respiratory Rate(breaths/min): 16 Photos: [N/A:N/A] Wound  Location: Right Lower Leg N/A N/A Wounding Event: Trauma N/A N/A Primary Etiology: Trauma, Other N/A N/A Comorbid History: Hypertension, Osteoarthritis N/A N/A Date Acquired: 08/26/2019 N/A N/A Weeks of Treatment: 2 N/A N/A Wound Status: Open N/A N/A Measurements L x W x D (cm) 1.1x0.9x0.1 N/A N/A Area (cm) : 0.778 N/A N/A Volume (cm) : 0.078 N/A N/A % Reduction in Area: 69.00% N/A N/A % Reduction in Volume: 68.90% N/A N/A Classification: Full Thickness Without Exposed N/A N/A Support Structures Exudate Amount: Medium N/A N/A Exudate Type: Serous N/A N/A Exudate Color: amber N/A N/A Wound Margin: Flat and Intact N/A N/A Granulation Amount: Medium (34-66%)  N/A N/A Granulation Quality: Red, Hyper-granulation N/A N/A Necrotic Amount: Medium (34-66%) N/A N/A Exposed Structures: Fat Layer (Subcutaneous Tissue) N/A N/A Exposed: Yes Fascia: No Tendon: No Muscle: No Joint: No Bone: No Epithelialization: None N/A N/A Treatment Notes Electronic Signature(s) Signed: 10/07/2019 4:04:00 PM By: Army Melia Entered By: Army Melia on 10/07/2019 15:21:47 Karen Hooper ([id number] Najjar, Avalene W. ([id number]) -------------------------------------------------------------------------------- Sharp Details Patient Name: Karen Hooper Date of Service: 10/07/2019 3:15 PM Medical Record Number: [id number] Patient Account Number: [id number] Date of Birth/Sex: [birth year] (86 y.o. F) Treating RN: Army Melia Primary Care Janthony Holleman: Otilio Miu Other Clinician: Referring Nahdia Doucet: Otilio Miu Treating Adorian Gwynne/Extender: Melburn Hake, HOYT Weeks in Treatment: 2 Active Inactive Necrotic Tissue Nursing Diagnoses: Impaired tissue integrity related to necrotic/devitalized tissue Knowledge deficit related to management of necrotic/devitalized tissue Goals: Necrotic/devitalized tissue will be minimized in the wound bed Date Initiated: 09/23/2019 Target Resolution Date:  10/07/2019 Goal Status: Active Interventions: Assess patient pain level pre-, during and post procedure and prior to discharge Treatment Activities: Apply topical anesthetic as ordered : 09/23/2019 Excisional debridement : 09/23/2019 Notes: Orientation to the Wound Care Program Nursing Diagnoses: Knowledge deficit related to the wound healing center program Goals: Patient/caregiver will verbalize understanding of the [facility] Date Initiated: 09/23/2019 Target Resolution Date: 10/08/2019 Goal Status: Active Interventions: Provide education on orientation to the wound center Notes: Wound/Skin Impairment Nursing Diagnoses: Impaired tissue integrity Goals: Ulcer/skin breakdown will have a volume reduction of 30% by week 4 Date Initiated: 09/23/2019 Target Resolution Date: 10/24/2019 Goal Status: Active Interventions: Assess ulceration(s) every visit Treatment Activities: Skin care regimen initiated : 09/23/2019 Karen Hooper, Karen Hooper ([id number]) Notes: Electronic Signature(s) Signed: 10/07/2019 4:04:00 PM By: Army Melia Entered By: Army Melia on 10/07/2019 15:21:40 Karen Hooper ([id number]) -------------------------------------------------------------------------------- Pain Assessment Details Patient Name: Karen Hooper Date of Service: 10/07/2019 3:15 PM Medical Record Number: [id number] Patient Account Number: [id number] Date of Birth/Sex: 10/08/1931 (86 y.o. F) Treating RN: Montey Hora Primary Care Deaunte Dente: Otilio Miu Other Clinician: Referring Otto Felkins: Otilio Miu Treating Cole Eastridge/Extender: Melburn Hake, HOYT Weeks in Treatment: 2 Active Problems Location of Pain Severity and Description of Pain Patient Has Paino No Site Locations Pain Management and Medication Current Pain Management: Electronic Signature(s) Signed: 10/07/2019 4:17:26 PM By: Montey Hora Entered By: Montey Hora on 10/07/2019 15:05:14 Karen Hooper  ([id number]) -------------------------------------------------------------------------------- Patient/Caregiver Education Details Patient Name: Karen Hooper Date of Service: 10/07/2019 3:15 PM Medical Record Number: [id number] Patient Account Number: [id number] Date of Birth/Gender: 01/22/1932 (86 y.o. F) Treating RN: Army Melia Primary Care Physician: Otilio Miu Other Clinician: Referring Physician: Otilio Miu Treating Physician/Extender: Melburn Hake, HOYT Weeks in Treatment: 2 Education Assessment Education Provided To: Patient Education Topics Provided Wound/Skin Impairment: Handouts: Caring for Your Ulcer Methods: Demonstration, Explain/Verbal Responses: State content correctly Electronic Signature(s) Signed: 10/07/2019 4:04:00 PM By: Army Melia Entered By: Army Melia on 10/07/2019 15:25:14 Karen Hooper ([id number]) -------------------------------------------------------------------------------- Wound Assessment Details Patient Name: Karen Hooper Date of Service: 10/07/2019 3:15 PM Medical Record Number: [id number] Patient Account Number: [id number] Date of Birth/Sex: 1931-09-09 (87 y.o. F) Treating RN: Montey Hora Primary Care Charistopher Rumble: Otilio Miu Other Clinician: Referring Awesome Jared: Otilio Miu Treating Riham Polyakov/Extender: STONE III, HOYT Weeks in Treatment: 2 Wound Status Wound Number: 1 Primary Etiology: Trauma, Other Wound Location: Right Lower Leg Wound Status: Open Wounding Event: Trauma Comorbid History: Hypertension, Osteoarthritis Date Acquired: 08/26/2019 Weeks Of Treatment: 2 Clustered Wound: No Photos Wound Measurements Length: (cm) 1.1 Width: (cm) 0.9 Depth: (cm) 0.1  Area: (cm) 0.778 Volume: (cm) 0.078 % Reduction in Area: 69% % Reduction in Volume: 68.9% Epithelialization: None Tunneling: No Undermining: No Wound Description Classification: Full Thickness Without Exposed Support Structures Wound Margin: Flat and  Intact Exudate Amount: Medium Exudate Type: Serous Exudate Color: amber Foul Odor After Cleansing: No Slough/Fibrino Yes Wound Bed Granulation Amount: Medium (34-66%) Exposed Structure Granulation Quality: Red, Hyper-granulation Fascia Exposed: No Necrotic Amount: Medium (34-66%) Fat Layer (Subcutaneous Tissue) Exposed: Yes Necrotic Quality: Adherent Slough Tendon Exposed: No Muscle Exposed: No Joint Exposed: No Bone Exposed: No Treatment Notes Wound #1 (Right Lower Leg) Notes H blue, conform tape stretch net Electronic Signature(s) Signed: 10/07/2019 4:17:26 PM By: Joneen Boers ([id number]) Entered By: Curtis Sites on 10/07/2019 15:11:15 Karen Hooper ([id number]) -------------------------------------------------------------------------------- Vitals Details Patient Name: Karen Hooper Date of Service: 10/07/2019 3:15 PM Medical Record Number: [id number] Patient Account Number: [id number] Date of Birth/Sex: [birth year] (86 y.o. F) Treating RN: Rodell Perna Primary Care Lenard Kampf: Elizabeth Sauer Other Clinician: Referring Marium Ragan: Elizabeth Sauer Treating Sadie Pickar/Extender: STONE III, HOYT Weeks in Treatment: 2 Vital Signs Time Taken: 14:50 Temperature (F): 97.7 Height (in): 64 Pulse (bpm): 59 Weight (lbs): 156 Respiratory Rate (breaths/min): 16 Body Mass Index (BMI): 26.8 Blood Pressure (mmHg): 113/45 Reference Range: 80 - 120 mg / dl Electronic Signature(s) Signed: 10/07/2019 4:19:28 PM By: Dayton Martes RCP, RRT, CHT Entered By: Dayton Martes on 10/07/2019 14:56:06

## 2019-10-07 NOTE — Progress Notes (Addendum)
Karen, Hooper ([id number]) Visit Report for 10/07/2019 Chief Complaint Document Details Patient Name: Karen Hooper, Karen Hooper Date of Service: 10/07/2019 3:15 PM Medical Record Number: [id number] Patient Account Number: [id number] Date of Birth/Sex: 04/09/1932 (86 y.o. F) Treating RN: Rodell Perna Primary Care Provider: Elizabeth Sauer Other Clinician: Referring Provider: Elizabeth Sauer Treating Provider/Extender: Linwood Dibbles, Mala Gibbard Weeks in Treatment: 2 Information Obtained from: Patient Chief Complaint Right leg hematoma and ulcer Electronic Signature(s) Signed: 10/07/2019 3:19:42 PM By: Lenda Kelp PA-C Entered By: Lenda Kelp on 10/07/2019 15:19:41 Karen Hooper ([id number]) -------------------------------------------------------------------------------- Debridement Details Patient Name: Karen Hooper Date of Service: 10/07/2019 3:15 PM Medical Record Number: [id number] Patient Account Number: [id number] Date of Birth/Sex: [birth year] (87 y.o. F) Treating RN: Rodell Perna Primary Care Provider: Elizabeth Sauer Other Clinician: Referring Provider: Elizabeth Sauer Treating Provider/Extender: Linwood Dibbles, Antoin Dargis Weeks in Treatment: 2 Debridement Performed for Wound #1 Right Lower Leg Assessment: Performed By: Physician STONE III, Kenleigh Toback E., PA-C Debridement Type: Debridement Level of Consciousness (Pre- Awake and Alert procedure): Pre-procedure Verification/Time Out Yes - 15:22 Taken: Start Time: 15:23 Total Area Debrided (L x W): 1.1 (cm) x 0.9 (cm) = 0.99 (cm) Tissue and other material debrided: Viable, Non-Viable, Subcutaneous, Skin: Dermis Level: Skin/Subcutaneous Tissue Debridement Description: Excisional Instrument: Curette Bleeding: None End Time: 15:24 Response to Treatment: Procedure was tolerated well Level of Consciousness (Post- Awake and Alert procedure): Post Debridement Measurements of Total Wound Length: (cm) 1.1 Width: (cm) 0.9 Depth: (cm) 0.1 Volume: (cm)  0.078 Character of Wound/Ulcer Post Debridement: Stable Post Procedure Diagnosis Same as Pre-procedure Electronic Signature(s) Signed: 10/07/2019 4:04:00 PM By: Rodell Perna Signed: 10/10/2019 5:29:46 PM By: Lenda Kelp PA-C Entered By: Rodell Perna on 10/07/2019 15:24:16 Karen Hooper ([id number]) -------------------------------------------------------------------------------- HPI Details Patient Name: Karen Hooper Date of Service: 10/07/2019 3:15 PM Medical Record Number: [id number] Patient Account Number: [id number] Date of Birth/Sex: 12-Sep-1931 (86 y.o. F) Treating RN: Rodell Perna Primary Care Provider: Elizabeth Sauer Other Clinician: Referring Provider: Elizabeth Sauer Treating Provider/Extender: Linwood Dibbles, Dhruti Ghuman Weeks in Treatment: 2 History of Present Illness HPI Description: 09/23/2019 upon evaluation today patient presents for initial inspection here in our clinic concerning issues that she has been having with her right anterior lower extremity. She actually sustained a traumatic wound when she hit her leg on the handle for her recliner roughly 1 month ago. She had a hematoma but initially never went to see anyone about this. Subsequently she then ended up continuing to have issues to the point that her pastor came by and told her that he really felt like she should go get this checked out at the doctor's office. She then subsequently did go in to see her primary care provider who debrided this away this past Friday for her. Subsequently we were then able to work her in for today. Nonetheless overall the patient does seem to be making some progress. She does have a history of hypertension but other than this really has no major medical problems and appears to be fairly healthy. 09/30/2019 upon evaluation today patient appears to be doing very well with regard to her wound. This is making good progress at this point [facility] has been utilized and overall seems to be doing  excellent for her. I do think that she probably does not need any additional antibiotics at this point based on what I am seeing overall I think she is doing quite nicely. 10/07/2019 upon evaluation today patient appears to be doing well with regard  to her right leg ulcer. This is measuring better and seems to be progressing in the right direction which is great news. There is no signs of active infection at this time. No fevers, chills, nausea, vomiting, or diarrhea. Electronic Signature(s) Signed: 10/07/2019 4:02:34 PM By: Lenda Kelp PA-C Entered By: Lenda Kelp on 10/07/2019 16:02:34 Karen Hooper ([id number]) -------------------------------------------------------------------------------- Physical Exam Details Patient Name: Karen Hooper Date of Service: 10/07/2019 3:15 PM Medical Record Number: [id number] Patient Account Number: [id number] Date of Birth/Sex: April 13, 1932 (87 y.o. F) Treating RN: Rodell Perna Primary Care Provider: Elizabeth Sauer Other Clinician: Referring Provider: Elizabeth Sauer Treating Provider/Extender: STONE III, Carter Kaman Weeks in Treatment: 2 Constitutional Well-nourished and well-hydrated in no acute distress. Respiratory normal breathing without difficulty. Psychiatric this patient is able to make decisions and demonstrates good insight into disease process. Alert and Oriented x 3. pleasant and cooperative. Notes Patient's wound bed currently showed signs of good granulation at this time. Fortunately there is no evidence of active infection which is great news. Overall I did have to perform some sharp debridement to clean away some of the slough from the surface of the wound along with dry dressing material around the edges of the wound. The patient tolerated all this today without complication post debridement wound bed appears to be doing much better. Electronic Signature(s) Signed: 10/07/2019 4:03:10 PM By: Lenda Kelp PA-C Entered By: Lenda Kelp  on 10/07/2019 16:03:10 Karen Hooper ([id number]) -------------------------------------------------------------------------------- Physician Orders Details Patient Name: Karen Hooper Date of Service: 10/07/2019 3:15 PM Medical Record Number: [id number] Patient Account Number: [id number] Date of Birth/Sex: [birth year] (87 y.o. F) Treating RN: Rodell Perna Primary Care Provider: Elizabeth Sauer Other Clinician: Referring Provider: Elizabeth Sauer Treating Provider/Extender: Linwood Dibbles, Orvilla Truett Weeks in Treatment: 2 Verbal / Phone Orders: No Diagnosis Coding ICD-10 Coding Code Description S80.11XA Contusion of right lower leg, initial encounter S81.801A Unspecified open wound, right lower leg, initial encounter I10 Essential (primary) hypertension Wound Cleansing Wound #1 Right Lower Leg o Dial antibacterial soap, wash wounds, rinse and pat dry prior to dressing wounds Primary Wound Dressing Wound #1 Right Lower Leg o Hydrafera Blue Ready Transfer Secondary Dressing Wound #1 Right Lower Leg o Conform/Kerlix Dressing Change Frequency Wound #1 Right Lower Leg o Change Dressing Monday, Wednesday, Friday Follow-up Appointments Wound #1 Right Lower Leg o Return Appointment in 1 week. Edema Control Wound #1 Right Lower Leg o Elevate legs to the level of the heart and pump ankles as often as possible Additional Orders / Instructions Wound #1 Right Lower Leg o Increase protein intake. o Activity as tolerated Medications-please add to medication list. Wound #1 Right Lower Leg o P.O. Antibiotics - continue antibiotics prescribed by PCP Electronic Signature(s) Signed: 10/07/2019 4:04:00 PM By: Rodell Perna Signed: 10/10/2019 5:29:46 PM By: Lenda Kelp PA-C Entered By: Rodell Perna on 10/07/2019 15:24:44 Karen Hooper ([id number]) -------------------------------------------------------------------------------- Problem List Details Patient Name: Karen Hooper Date of  Service: 10/07/2019 3:15 PM Medical Record Number: [id number] Patient Account Number: [id number] Date of Birth/Sex: [birth year] (86 y.o. F) Treating RN: Rodell Perna Primary Care Provider: Elizabeth Sauer Other Clinician: Referring Provider: Elizabeth Sauer Treating Provider/Extender: Linwood Dibbles, Zymir Napoli Weeks in Treatment: 2 Active Problems ICD-10 Evaluated Encounter Code Description Active Date Today Diagnosis S80.11XA Contusion of right lower leg, initial encounter 09/23/2019 No Yes S81.801A Unspecified open wound, right lower leg, initial encounter 09/23/2019 No Yes I10 Essential (primary) hypertension 09/23/2019 No Yes Inactive Problems Resolved Problems Electronic Signature(s) Signed: 10/07/2019  3:19:35 PM By: Worthy Keeler PA-C Entered By: Worthy Keeler on 10/07/2019 15:19:35 [name] ([id number]) -------------------------------------------------------------------------------- Progress Note Details Patient Name: [name] Date of Service: 10/07/2019 3:15 PM Medical Record Number: [id number] Patient Account Number: [id number] Date of Birth/Sex: 08/05/1931 (87 y.o. F) Treating RN: Army Melia Primary Care Provider: Otilio Miu Other Clinician: Referring Provider: Otilio Miu Treating Provider/Extender: Melburn Hake, Aerial Dilley Weeks in Treatment: 2 Subjective Chief Complaint Information obtained from Patient Right leg hematoma and ulcer History of Present Illness (HPI) 09/23/2019 upon evaluation today patient presents for initial inspection here in our clinic concerning issues that she has been having with her right anterior lower extremity. She actually sustained a traumatic wound when she hit her leg on the handle for her recliner roughly 1 month ago. She had a hematoma but initially never went to see anyone about this. Subsequently she then ended up continuing to have issues to the point that her pastor came by and told her that he really felt like she should go get this  checked out at the doctor's office. She then subsequently did go in to see her primary care provider who debrided this away this past Friday for her. Subsequently we were then able to work her in for today. Nonetheless overall the patient does seem to be making some progress. She does have a history of hypertension but other than this really has no major medical problems and appears to be fairly healthy. 09/30/2019 upon evaluation today patient appears to be doing very well with regard to her wound. This is making good progress at this point [facility] has been utilized and overall seems to be doing excellent for her. I do think that she probably does not need any additional antibiotics at this point based on what I am seeing overall I think she is doing quite nicely. 10/07/2019 upon evaluation today patient appears to be doing well with regard to her right leg ulcer. This is measuring better and seems to be progressing in the right direction which is great news. There is no signs of active infection at this time. No fevers, chills, nausea, vomiting, or diarrhea. Objective Constitutional Well-nourished and well-hydrated in no acute distress. Vitals Time Taken: 2:50 PM, Height: 64 in, Weight: 156 lbs, BMI: 26.8, Temperature: 97.7 F, Pulse: 59 bpm, Respiratory Rate: 16 breaths/min, Blood Pressure: 113/45 mmHg. Respiratory normal breathing without difficulty. Psychiatric this patient is able to make decisions and demonstrates good insight into disease process. Alert and Oriented x 3. pleasant and cooperative. General Notes: Patient's wound bed currently showed signs of good granulation at this time. Fortunately there is no evidence of active infection which is great news. Overall I did have to perform some sharp debridement to clean away some of the slough from the surface of the wound along with dry dressing material around the edges of the wound. The patient tolerated all this today without  complication post debridement wound bed appears to be doing much better. Integumentary (Hair, Skin) Wound #1 status is Open. Original cause of wound was Trauma. The wound is located on the Right Lower Leg. The wound measures 1.1cm length x 0.9cm width x 0.1cm depth; 0.778cm^2 area and 0.078cm^3 volume. There is Fat Layer (Subcutaneous Tissue) Exposed exposed. There is no tunneling or undermining noted. There is a medium amount of serous drainage noted. The wound margin is flat and intact. There is medium (34-66%) red, hyper - granulation within the wound bed. There is a medium (34-66%)  amount of necrotic tissue within the wound bed including Adherent Slough. RAAHI, KORBER ([id number]) Assessment Active Problems ICD-10 Contusion of right lower leg, initial encounter Unspecified open wound, right lower leg, initial encounter Essential (primary) hypertension Procedures Wound #1 Pre-procedure diagnosis of Wound #1 is a Trauma, Other located on the Right Lower Leg . There was a Excisional Skin/Subcutaneous Tissue Debridement with a total area of 0.99 sq cm performed by STONE III, Camauri Craton E., PA-C. With the following instrument(s): Curette to remove Viable and Non-Viable tissue/material. Material removed includes Subcutaneous Tissue and Skin: Dermis and. A time out was conducted at 15:22, prior to the start of the procedure. There was no bleeding. The procedure was tolerated well. Post Debridement Measurements: 1.1cm length x 0.9cm width x 0.1cm depth; 0.078cm^3 volume. Character of Wound/Ulcer Post Debridement is stable. Post procedure Diagnosis Wound #1: Same as Pre-Procedure Plan Wound Cleansing: Wound #1 Right Lower Leg: Dial antibacterial soap, wash wounds, rinse and pat dry prior to dressing wounds Primary Wound Dressing: Wound #1 Right Lower Leg: Hydrafera Blue Ready Transfer Secondary Dressing: Wound #1 Right Lower Leg: Conform/Kerlix Dressing Change Frequency: Wound #1 Right Lower  Leg: Change Dressing Monday, Wednesday, Friday Follow-up Appointments: Wound #1 Right Lower Leg: Return Appointment in 1 week. Edema Control: Wound #1 Right Lower Leg: Elevate legs to the level of the heart and pump ankles as often as possible Additional Orders / Instructions: Wound #1 Right Lower Leg: Increase protein intake. Activity as tolerated Medications-please add to medication list.: Wound #1 Right Lower Leg: P.O. Antibiotics - continue antibiotics prescribed by PCP 1. My suggestion at this time is can be that we continue with the wound care measures as before this is good include continuing with the [facility] dressing which is doing excellent for. 2. I am also can recommend currently that we go ahead and continue with the elevation to help with edema control. The patient also is currently getting continue with changing the dressing at home at this point. MIRAYA, CUDNEY ([id number]) We will see patient back for reevaluation in 1 week here in the clinic. If anything worsens or changes patient will contact our office for additional recommendations. Electronic Signature(s) Signed: 10/07/2019 4:03:57 PM By: Lenda Kelp PA-C Entered By: Lenda Kelp on 10/07/2019 16:03:56 Karen Hooper ([id number]) -------------------------------------------------------------------------------- SuperBill Details Patient Name: Karen Hooper Date of Service: 10/07/2019 Medical Record Number: [id number] Patient Account Number: [id number] Date of Birth/Sex: 03/14/1932 (87 y.o. F) Treating RN: Rodell Perna Primary Care Provider: Elizabeth Sauer Other Clinician: Referring Provider: Elizabeth Sauer Treating Provider/Extender: STONE III, Josalynn Johndrow Weeks in Treatment: 2 Diagnosis Coding ICD-10 Codes Code Description S80.11XA Contusion of right lower leg, initial encounter S81.801A Unspecified open wound, right lower leg, initial encounter I10 Essential (primary) hypertension Facility  Procedures CPT4 Code: [id number] Description: 11042 - DEB SUBQ TISSUE 20 SQ CM/< Modifier: Quantity: 1 CPT4 Code: Description: ICD-10 Diagnosis Description S81.801A Unspecified open wound, right lower leg, initial encounter Modifier: Quantity: Physician Procedures CPT4 Code: [id number] Description: 11042 - WC PHYS SUBQ TISS 20 SQ CM Modifier: Quantity: 1 CPT4 Code: Description: ICD-10 Diagnosis Description S81.801A Unspecified open wound, right lower leg, initial encounter Modifier: Quantity: Electronic Signature(s) Signed: 10/07/2019 4:04:16 PM By: Lenda Kelp PA-C Entered By: Lenda Kelp on 10/07/2019 16:04:15

## 2019-10-14 ENCOUNTER — Encounter: Payer: Medicare Other | Attending: Physician Assistant | Admitting: Physician Assistant

## 2019-10-14 ENCOUNTER — Other Ambulatory Visit: Payer: Self-pay

## 2019-10-14 DIAGNOSIS — S81801A Unspecified open wound, right lower leg, initial encounter: Secondary | ICD-10-CM | POA: Diagnosis not present

## 2019-10-14 DIAGNOSIS — S8011XA Contusion of right lower leg, initial encounter: Secondary | ICD-10-CM | POA: Insufficient documentation

## 2019-10-14 DIAGNOSIS — I10 Essential (primary) hypertension: Secondary | ICD-10-CM | POA: Diagnosis not present

## 2019-10-14 DIAGNOSIS — X58XXXA Exposure to other specified factors, initial encounter: Secondary | ICD-10-CM | POA: Insufficient documentation

## 2019-10-14 NOTE — Progress Notes (Signed)
Karen Hooper, Karen Hooper ([id number]) Visit Report for 10/14/2019 Arrival Information Details Patient Name: Karen Hooper, Karen Hooper Date of Service: 10/14/2019 1:45 PM Medical Record Number: [id number] Patient Account Number: [id number] Date of Birth/Sex: [birth year] (86 y.o. F) Treating RN: Rodell Perna Primary Care Demita Tobia: Elizabeth Sauer Other Clinician: Referring Idrees Quam: Elizabeth Sauer Treating Griffyn Kucinski/Extender: Linwood Dibbles, HOYT Weeks in Treatment: 3 Visit Information History Since Last Visit Added or deleted any medications: No Patient Arrived: Ambulatory Any new allergies or adverse reactions: No Arrival Time: 13:29 Had a fall or experienced change in No Accompanied By: daughter activities of daily living that may affect Transfer Assistance: None risk of falls: Patient Identification Verified: Yes Signs or symptoms of abuse/neglect since last visito No Secondary Verification Process Completed: Yes Hospitalized since last visit: No Patient Requires Transmission-Based Precautions: No Implantable device outside of the clinic excluding No Patient Has Alerts: No cellular tissue based products placed in the center since last visit: Has Dressing in Place as Prescribed: Yes Pain Present Now: No Electronic Signature(s) Signed: 10/14/2019 3:57:19 PM By: Dayton Martes RCP, RRT, CHT Entered By: Dayton Martes on 10/14/2019 13:33:39 Karen Hooper ([id number]) -------------------------------------------------------------------------------- Encounter Discharge Information Details Patient Name: Karen Hooper Date of Service: 10/14/2019 1:45 PM Medical Record Number: [id number] Patient Account Number: [id number] Date of Birth/Sex: March 02, 1932 (86 y.o. F) Treating RN: Rodell Perna Primary Care Cielo Arias: Elizabeth Sauer Other Clinician: Referring Jacklyne Baik: Elizabeth Sauer Treating Karriem Muench/Extender: Linwood Dibbles, HOYT Weeks in Treatment: 3 Encounter Discharge Information Items Post  Procedure Vitals Discharge Condition: Stable Temperature (F): 98.3 Ambulatory Status: Ambulatory Pulse (bpm): 57 Discharge Destination: Home Respiratory Rate (breaths/min): 16 Transportation: Private Auto Blood Pressure (mmHg): 125/48 Accompanied By: daughter Schedule Follow-up Appointment: Yes Clinical Summary of Care: Electronic Signature(s) Signed: 10/14/2019 3:08:20 PM By: Rodell Perna Entered By: Rodell Perna on 10/14/2019 14:05:37 Karen Hooper ([id number]) -------------------------------------------------------------------------------- Lower Extremity Assessment Details Patient Name: Karen Hooper Date of Service: 10/14/2019 1:45 PM Medical Record Number: [id number] Patient Account Number: [id number] Date of Birth/Sex: January 14, 1932 (86 y.o. F) Treating RN: Rodell Perna Primary Care Maicol Bowland: Elizabeth Sauer Other Clinician: Referring Cina Klumpp: Elizabeth Sauer Treating Michiah Mudry/Extender: STONE III, HOYT Weeks in Treatment: 3 Edema Assessment Assessed: [Left: No] [Right: No] Edema: [Left: N] [Right: o] Vascular Assessment Pulses: Dorsalis Pedis Palpable: [Right:Yes] Electronic Signature(s) Signed: 10/14/2019 3:08:20 PM By: Rodell Perna Entered By: Rodell Perna on 10/14/2019 13:52:22 Karen Hooper ([id number]) -------------------------------------------------------------------------------- Multi Wound Chart Details Patient Name: Karen Hooper Date of Service: 10/14/2019 1:45 PM Medical Record Number: [id number] Patient Account Number: [id number] Date of Birth/Sex: 1932-06-25 (86 y.o. F) Treating RN: Rodell Perna Primary Care Quin Mcpherson: Elizabeth Sauer Other Clinician: Referring Laela Deviney: Elizabeth Sauer Treating Aydrien Froman/Extender: STONE III, HOYT Weeks in Treatment: 3 Vital Signs Height(in): 64 Pulse(bpm): 57 Weight(lbs): 156 Blood Pressure(mmHg): 125/48 Body Mass Index(BMI): 27 Temperature(F): 98.3 Respiratory Rate(breaths/min): 16 Photos: [N/A:N/A] Wound Location:  Right Lower Leg N/A N/A Wounding Event: Trauma N/A N/A Primary Etiology: Trauma, Other N/A N/A Comorbid History: Hypertension, Osteoarthritis N/A N/A Date Acquired: 08/26/2019 N/A N/A Weeks of Treatment: 3 N/A N/A Wound Status: Open N/A N/A Measurements L x W x D (cm) 0.5x0.2x0.1 N/A N/A Area (cm) : 0.079 N/A N/A Volume (cm) : 0.008 N/A N/A % Reduction in Area: 96.90% N/A N/A % Reduction in Volume: 96.80% N/A N/A Classification: Full Thickness Without Exposed N/A N/A Support Structures Exudate Amount: Medium N/A N/A Exudate Type: Serous N/A N/A Exudate Color: amber N/A N/A Wound Margin: Flat and Intact N/A N/A Granulation Amount: Large (67-100%)  N/A N/A Granulation Quality: Red, Hyper-granulation N/A N/A Necrotic Amount: Small (1-33%) N/A N/A Exposed Structures: Fat Layer (Subcutaneous Tissue) N/A N/A Exposed: Yes Fascia: No Tendon: No Muscle: No Joint: No Bone: No Epithelialization: Medium (34-66%) N/A N/A Treatment Notes Electronic Signature(s) Signed: 10/14/2019 3:08:20 PM By: Army Melia Entered By: Army Melia on 10/14/2019 14:00:22 Karen Hooper ([id number] Najjar, Karen W. ([id number]) -------------------------------------------------------------------------------- [place] Details Patient Name: Karen Hooper Date of Service: 10/14/2019 1:45 PM Medical Record Number: [id number] Patient Account Number: [id number] Date of Birth/Sex: 1931-10-09 (86 y.o. F) Treating RN: Army Melia Primary Care Lanard Arguijo: Otilio Miu Other Clinician: Referring Fara Worthy: Otilio Miu Treating Aayra Hornbaker/Extender: Melburn Hake, HOYT Weeks in Treatment: 3 Active Inactive Necrotic Tissue Nursing Diagnoses: Impaired tissue integrity related to necrotic/devitalized tissue Knowledge deficit related to management of necrotic/devitalized tissue Goals: Necrotic/devitalized tissue will be minimized in the wound bed Date Initiated: 09/23/2019 Target Resolution Date:  10/07/2019 Goal Status: Active Interventions: Assess patient pain level pre-, during and post procedure and prior to discharge Treatment Activities: Apply topical anesthetic as ordered : 09/23/2019 Excisional debridement : 09/23/2019 Notes: Orientation to the Wound Care Program Nursing Diagnoses: Knowledge deficit related to the wound healing center program Goals: Patient/caregiver will verbalize understanding of the [place] Program Date Initiated: 09/23/2019 Target Resolution Date: 10/08/2019 Goal Status: Active Interventions: Provide education on orientation to the wound center Notes: Wound/Skin Impairment Nursing Diagnoses: Impaired tissue integrity Goals: Ulcer/skin breakdown will have a volume reduction of 30% by week 4 Date Initiated: 09/23/2019 Target Resolution Date: 10/24/2019 Goal Status: Active Interventions: Assess ulceration(s) every visit Treatment Activities: Skin care regimen initiated : 09/23/2019 Karen Hooper, Karen Hooper ([id number]) Notes: Electronic Signature(s) Signed: 10/14/2019 3:08:20 PM By: Army Melia Entered By: Army Melia on 10/14/2019 14:00:05 Karen Hooper ([id number]) -------------------------------------------------------------------------------- Pain Assessment Details Patient Name: Karen Hooper Date of Service: 10/14/2019 1:45 PM Medical Record Number: [id number] Patient Account Number: [id number] Date of Birth/Sex: January 19, 1932 (86 y.o. F) Treating RN: Army Melia Primary Care Efe Fazzino: Otilio Miu Other Clinician: Referring Gerod Caligiuri: Otilio Miu Treating Parminder Cupples/Extender: Melburn Hake, HOYT Weeks in Treatment: 3 Active Problems Location of Pain Severity and Description of Pain Patient Has Paino No Site Locations Pain Management and Medication Current Pain Management: Electronic Signature(s) Signed: 10/14/2019 3:08:20 PM By: Army Melia Entered By: Army Melia on 10/14/2019 13:51:43 Karen Hooper  ([id number]) -------------------------------------------------------------------------------- Patient/Caregiver Education Details Patient Name: Karen Hooper Date of Service: 10/14/2019 1:45 PM Medical Record Number: [id number] Patient Account Number: [id number] Date of Birth/Gender: 1932-03-21 (86 y.o. F) Treating RN: Army Melia Primary Care Physician: Otilio Miu Other Clinician: Referring Physician: Otilio Miu Treating Physician/Extender: Melburn Hake, HOYT Weeks in Treatment: 3 Education Assessment Education Provided To: Patient Education Topics Provided Wound/Skin Impairment: Handouts: Caring for Your Ulcer Methods: Demonstration, Explain/Verbal Responses: State content correctly Electronic Signature(s) Signed: 10/14/2019 3:08:20 PM By: Army Melia Entered By: Army Melia on 10/14/2019 14:04:51 Karen Hooper ([id number]) -------------------------------------------------------------------------------- Wound Assessment Details Patient Name: Karen Hooper Date of Service: 10/14/2019 1:45 PM Medical Record Number: [id number] Patient Account Number: [id number] Date of Birth/Sex: May 05, 1932 (87 y.o. F) Treating RN: Army Melia Primary Care Ausencio Vaden: Otilio Miu Other Clinician: Referring Ramata Strothman: Otilio Miu Treating Inger Wiest/Extender: STONE III, HOYT Weeks in Treatment: 3 Wound Status Wound Number: 1 Primary Etiology: Trauma, Other Wound Location: Right Lower Leg Wound Status: Open Wounding Event: Trauma Comorbid History: Hypertension, Osteoarthritis Date Acquired: 08/26/2019 Weeks Of Treatment: 3 Clustered Wound: No Photos Wound Measurements Length: (cm) 0.5 Width: (cm) 0.2 Depth: (cm)  0.1 Area: (cm) 0.079 Volume: (cm) 0.008 % Reduction in Area: 96.9% % Reduction in Volume: 96.8% Epithelialization: Medium (34-66%) Tunneling: No Undermining: No Wound Description Classification: Full Thickness Without Exposed Support Structu Wound Margin: Flat and  Intact Exudate Amount: Medium Exudate Type: Serous Exudate Color: amber res Foul Odor After Cleansing: No Slough/Fibrino Yes Wound Bed Granulation Amount: Large (67-100%) Exposed Structure Granulation Quality: Red, Hyper-granulation Fascia Exposed: No Necrotic Amount: Small (1-33%) Fat Layer (Subcutaneous Tissue) Exposed: Yes Necrotic Quality: Adherent Slough Tendon Exposed: No Muscle Exposed: No Joint Exposed: No Bone Exposed: No Treatment Notes Wound #1 (Right Lower Leg) Notes xerform, conform tape stretch net Electronic Signature(s) Signed: 10/14/2019 3:08:20 PM By: Willette Cluster ([id number]) Entered By: Rodell Perna on 10/14/2019 13:52:09 Karen Hooper ([id number]) -------------------------------------------------------------------------------- Vitals Details Patient Name: Karen Hooper Date of Service: 10/14/2019 1:45 PM Medical Record Number: [id number] Patient Account Number: [id number] Date of Birth/Sex: 09-19-1931 (87 y.o. F) Treating RN: Rodell Perna Primary Care Alyssha Housh: Elizabeth Sauer Other Clinician: Referring Cailan General: Elizabeth Sauer Treating Kailly Richoux/Extender: STONE III, HOYT Weeks in Treatment: 3 Vital Signs Time Taken: 13:30 Temperature (F): 98.3 Height (in): 64 Pulse (bpm): 57 Weight (lbs): 156 Respiratory Rate (breaths/min): 16 Body Mass Index (BMI): 26.8 Blood Pressure (mmHg): 125/48 Reference Range: 80 - 120 mg / dl Electronic Signature(s) Signed: 10/14/2019 3:57:19 PM By: Dayton Martes RCP, RRT, CHT Entered By: Dayton Martes on 10/14/2019 13:35:33

## 2019-10-14 NOTE — Progress Notes (Addendum)
Karen Hooper ([id number]) Visit Report for 10/14/2019 Chief Complaint Document Details Patient Name: Karen Hooper, Karen Hooper Date of Service: 10/14/2019 1:45 PM Medical Record Number: [id number] Patient Account Number: [id number] Date of Birth/Sex: 1932/05/09 (86 y.o. F) Treating RN: Rodell Perna Primary Care Provider: Elizabeth Sauer Other Clinician: Referring Provider: Elizabeth Sauer Treating Provider/Extender: Linwood Dibbles, Forney Kleinpeter Weeks in Treatment: 3 Information Obtained from: Patient Chief Complaint Right leg hematoma and ulcer Electronic Signature(s) Signed: 10/14/2019 1:56:49 PM By: Lenda Kelp PA-C Entered By: Lenda Kelp on 10/14/2019 13:56:48 Karen Hooper ([id number]) -------------------------------------------------------------------------------- Debridement Details Patient Name: Karen Hooper Date of Service: 10/14/2019 1:45 PM Medical Record Number: [id number] Patient Account Number: [id number] Date of Birth/Sex: [birth year] (87 y.o. F) Treating RN: Rodell Perna Primary Care Provider: Elizabeth Sauer Other Clinician: Referring Provider: Elizabeth Sauer Treating Provider/Extender: Linwood Dibbles, Raheim Beutler Weeks in Treatment: 3 Debridement Performed for Wound #1 Right Lower Leg Assessment: Performed By: Physician STONE III, Demetrius Mahler E., PA-C Debridement Type: Debridement Level of Consciousness (Pre- Awake and Alert procedure): Pre-procedure Verification/Time Out Yes - 14:00 Taken: Start Time: 14:01 Pain Control: Lidocaine Total Area Debrided (L x W): 0.5 (cm) x 0.2 (cm) = 0.1 (cm) Tissue and other material debrided: Viable, Non-Viable, Slough, Subcutaneous, Skin: Dermis , Biofilm, Slough Level: Skin/Subcutaneous Tissue Debridement Description: Excisional Instrument: Curette Bleeding: None End Time: 14:02 Response to Treatment: Procedure was tolerated well Level of Consciousness (Post- Awake and Alert procedure): Post Debridement Measurements of Total Wound Length: (cm) 0.5 Width:  (cm) 0.2 Depth: (cm) 0.1 Volume: (cm) 0.008 Character of Wound/Ulcer Post Debridement: Stable Post Procedure Diagnosis Same as Pre-procedure Electronic Signature(s) Signed: 10/14/2019 3:14:22 PM By: Lenda Kelp PA-C Signed: 10/14/2019 4:16:14 PM By: Rodell Perna Previous Signature: 10/14/2019 3:08:20 PM Version By: Rodell Perna Entered By: Lenda Kelp on 10/14/2019 15:14:21 Karen Hooper ([id number]) -------------------------------------------------------------------------------- HPI Details Patient Name: Karen Hooper Date of Service: 10/14/2019 1:45 PM Medical Record Number: [id number] Patient Account Number: [id number] Date of Birth/Sex: March 15, 1932 (86 y.o. F) Treating RN: Rodell Perna Primary Care Provider: Elizabeth Sauer Other Clinician: Referring Provider: Elizabeth Sauer Treating Provider/Extender: Linwood Dibbles, Cabella Kimm Weeks in Treatment: 3 History of Present Illness HPI Description: 09/23/2019 upon evaluation today patient presents for initial inspection here in our clinic concerning issues that she has been having with her right anterior lower extremity. She actually sustained a traumatic wound when she hit her leg on the handle for her recliner roughly 1 month ago. She had a hematoma but initially never went to see anyone about this. Subsequently she then ended up continuing to have issues to the point that her pastor came by and told her that he really felt like she should go get this checked out at the doctor's office. She then subsequently did go in to see her primary care provider who debrided this away this past Friday for her. Subsequently we were then able to work her in for today. Nonetheless overall the patient does seem to be making some progress. She does have a history of hypertension but other than this really has no major medical problems and appears to be fairly healthy. 09/30/2019 upon evaluation today patient appears to be doing very well with regard to her wound.  This is making good progress at this point [facility] has been utilized and overall seems to be doing excellent for her. I do think that she probably does not need any additional antibiotics at this point based on what I am seeing overall I think  she is doing quite nicely. 10/07/2019 upon evaluation today patient appears to be doing well with regard to her right leg ulcer. This is measuring better and seems to be progressing in the right direction which is great news. There is no signs of active infection at this time. No fevers, chills, nausea, vomiting, or diarrhea. 10/14/2019 upon evaluation today patient appears to be doing very well in regard to her ulcer. This is measuring much better. There is a lot of dry skin around the edges but nonetheless I do believe she is making good progress. She is going require little bit of sharp debridement today to clear away some of the dry skin around the edges of the wound as well as some of the minimal slough noted on the surface of the wound. Electronic Signature(s) Signed: 10/14/2019 3:12:35 PM By: Lenda Kelp PA-C Entered By: Lenda Kelp on 10/14/2019 15:12:35 Karen Hooper ([id number]) -------------------------------------------------------------------------------- Physical Exam Details Patient Name: Karen Hooper Date of Service: 10/14/2019 1:45 PM Medical Record Number: [id number] Patient Account Number: [id number] Date of Birth/Sex: 1931/10/21 (87 y.o. F) Treating RN: Rodell Perna Primary Care Provider: Elizabeth Sauer Other Clinician: Referring Provider: Elizabeth Sauer Treating Provider/Extender: STONE III, Millee Denise Weeks in Treatment: 3 Constitutional Well-nourished and well-hydrated in no acute distress. Respiratory normal breathing without difficulty. Psychiatric this patient is able to make decisions and demonstrates good insight into disease process. Alert and Oriented x 3. pleasant and cooperative. Notes His wound bed currently  showed signs of good granulation at this time. Post debridement the wound bed appears to be doing much better I remove slough/biofilm, and dry thickened skin from around the edges of the wound all of which she tolerated without any pain today post debridement the wound bed appears to be doing much better. Electronic Signature(s) Signed: 10/14/2019 3:13:05 PM By: Lenda Kelp PA-C Entered By: Lenda Kelp on 10/14/2019 15:13:04 Karen Hooper ([id number]) -------------------------------------------------------------------------------- Physician Orders Details Patient Name: Karen Hooper Date of Service: 10/14/2019 1:45 PM Medical Record Number: [id number] Patient Account Number: [id number] Date of Birth/Sex: 10-07-1931 (87 y.o. F) Treating RN: Rodell Perna Primary Care Provider: Elizabeth Sauer Other Clinician: Referring Provider: Elizabeth Sauer Treating Provider/Extender: Linwood Dibbles, Willetta York Weeks in Treatment: 3 Verbal / Phone Orders: No Diagnosis Coding ICD-10 Coding Code Description S80.11XA Contusion of right lower leg, initial encounter S81.801A Unspecified open wound, right lower leg, initial encounter I10 Essential (primary) hypertension Wound Cleansing Wound #1 Right Lower Leg o Dial antibacterial soap, wash wounds, rinse and pat dry prior to dressing wounds Primary Wound Dressing Wound #1 Right Lower Leg o Xeroform Secondary Dressing Wound #1 Right Lower Leg o Conform/Kerlix Dressing Change Frequency Wound #1 Right Lower Leg o Change Dressing Monday, Wednesday, Friday Follow-up Appointments Wound #1 Right Lower Leg o Return Appointment in 1 week. Edema Control Wound #1 Right Lower Leg o Elevate legs to the level of the heart and pump ankles as often as possible Additional Orders / Instructions Wound #1 Right Lower Leg o Increase protein intake. o Activity as tolerated Medications-please add to medication list. Wound #1 Right Lower Leg o P.O.  Antibiotics - continue antibiotics prescribed by PCP Electronic Signature(s) Signed: 10/14/2019 3:08:20 PM By: Rodell Perna Signed: 10/18/2019 10:31:16 AM By: Lenda Kelp PA-C Entered By: Rodell Perna on 10/14/2019 14:03:21 Karen Hooper ([id number]) -------------------------------------------------------------------------------- Problem List Details Patient Name: Karen Hooper Date of Service: 10/14/2019 1:45 PM Medical Record Number: [id number] Patient Account Number: [id number] Date of Birth/Sex: 1932-05-02 (  86 y.o. F) Treating RN: Rodell PernaScott, Dajea Primary Care Provider: Elizabeth SauerJones, Deanna Other Clinician: Referring Provider: Elizabeth SauerJones, Deanna Treating Provider/Extender: Linwood DibblesSTONE III, Addi Pak Weeks in Treatment: 3 Active Problems ICD-10 Evaluated Encounter Code Description Active Date Today Diagnosis S80.11XA Contusion of right lower leg, initial encounter 09/23/2019 No Yes S81.801A Unspecified open wound, right lower leg, initial encounter 09/23/2019 No Yes I10 Essential (primary) hypertension 09/23/2019 No Yes Inactive Problems Resolved Problems Electronic Signature(s) Signed: 10/14/2019 1:53:10 PM By: Lenda KelpStone III, Halvor Behrend PA-C Entered By: Lenda KelpStone III, Lyndi Holbein on 10/14/2019 13:53:09 Karen GalloKIRBY, Ariatna W. ([id number]) -------------------------------------------------------------------------------- Progress Note Details Patient Name: Karen GalloKIRBY, Kiari W. Date of Service: 10/14/2019 1:45 PM Medical Record Number: [id number] Patient Account Number: [id number] Date of Birth/Sex: 08/09/1931 (87 y.o. F) Treating RN: Rodell PernaScott, Dajea Primary Care Provider: Elizabeth SauerJones, Deanna Other Clinician: Referring Provider: Elizabeth SauerJones, Deanna Treating Provider/Extender: Linwood DibblesSTONE III, Mazie Fencl Weeks in Treatment: 3 Subjective Chief Complaint Information obtained from Patient Right leg hematoma and ulcer History of Present Illness (HPI) 09/23/2019 upon evaluation today patient presents for initial inspection here in our clinic concerning issues that she has  been having with her right anterior lower extremity. She actually sustained a traumatic wound when she hit her leg on the handle for her recliner roughly 1 month ago. She had a hematoma but initially never went to see anyone about this. Subsequently she then ended up continuing to have issues to the point that her pastor came by and told her that he really felt like she should go get this checked out at the doctor's office. She then subsequently did go in to see her primary care provider who debrided this away this past Friday for her. Subsequently we were then able to work her in for today. Nonetheless overall the patient does seem to be making some progress. She does have a history of hypertension but other than this really has no major medical problems and appears to be fairly healthy. 09/30/2019 upon evaluation today patient appears to be doing very well with regard to her wound. This is making good progress at this point [facility] and overall seems to be doing excellent for her. I do think that she probably does not need any additional antibiotics at this point based on what I am seeing overall I think she is doing quite nicely. 10/07/2019 upon evaluation today patient appears to be doing well with regard to her right leg ulcer. This is measuring better and seems to be progressing in the right direction which is great news. There is no signs of active infection at this time. No fevers, chills, nausea, vomiting, or diarrhea. 10/14/2019 upon evaluation today patient appears to be doing very well in regard to her ulcer. This is measuring much better. There is a lot of dry skin around the edges but nonetheless I do believe she is making good progress. She is going require little bit of sharp debridement today to clear away some of the dry skin around the edges of the wound as well as some of the minimal slough noted on the surface of the  wound. Objective Constitutional Well-nourished and well-hydrated in no acute distress. Vitals Time Taken: 1:30 PM, Height: 64 in, Weight: 156 lbs, BMI: 26.8, Temperature: 98.3 F, Pulse: 57 bpm, Respiratory Rate: 16 breaths/min, Blood Pressure: 125/48 mmHg. Respiratory normal breathing without difficulty. Psychiatric this patient is able to make decisions and demonstrates good insight into disease process. Alert and Oriented x 3. pleasant and cooperative. General Notes: His wound bed currently showed signs  of good granulation at this time. Post debridement the wound bed appears to be doing much better I remove slough/biofilm, and dry thickened skin from around the edges of the wound all of which she tolerated without any pain today post debridement the wound bed appears to be doing much better. Integumentary (Hair, Skin) Wound #1 status is Open. Original cause of wound was Trauma. The wound is located on the Right Lower Leg. The wound measures 0.5cm length x 0.2cm width x 0.1cm depth; 0.079cm^2 area and 0.008cm^3 volume. There is Fat Layer (Subcutaneous Tissue) Exposed exposed. There is no tunneling or undermining noted. There is a medium amount of serous drainage noted. The wound margin is flat and intact. There is large (67-100%) red, hyper - granulation within the wound bed. There is a small (1-33%) amount of necrotic tissue within the wound bed including Adherent Slough. MELEANA, COMMERFORD ([id number]) Assessment Active Problems ICD-10 Contusion of right lower leg, initial encounter Unspecified open wound, right lower leg, initial encounter Essential (primary) hypertension Procedures Wound #1 Pre-procedure diagnosis of Wound #1 is a Trauma, Other located on the Right Lower Leg . There was a Excisional Skin/Subcutaneous Tissue Debridement with a total area of 0.1 sq cm performed by STONE III, Devika Dragovich E., PA-C. With the following instrument(s): Curette to remove Viable and Non-Viable  tissue/material. Material removed includes Subcutaneous Tissue, Slough, Skin: Dermis, and Biofilm after achieving pain control using Lidocaine. A time out was conducted at 14:00, prior to the start of the procedure. There was no bleeding. The procedure was tolerated well. Post Debridement Measurements: 0.5cm length x 0.2cm width x 0.1cm depth; 0.008cm^3 volume. Character of Wound/Ulcer Post Debridement is stable. Post procedure Diagnosis Wound #1: Same as Pre-Procedure Plan Wound Cleansing: Wound #1 Right Lower Leg: Dial antibacterial soap, wash wounds, rinse and pat dry prior to dressing wounds Primary Wound Dressing: Wound #1 Right Lower Leg: Xeroform Secondary Dressing: Wound #1 Right Lower Leg: Conform/Kerlix Dressing Change Frequency: Wound #1 Right Lower Leg: Change Dressing Monday, Wednesday, Friday Follow-up Appointments: Wound #1 Right Lower Leg: Return Appointment in 1 week. Edema Control: Wound #1 Right Lower Leg: Elevate legs to the level of the heart and pump ankles as often as possible Additional Orders / Instructions: Wound #1 Right Lower Leg: Increase protein intake. Activity as tolerated Medications-please add to medication list.: Wound #1 Right Lower Leg: P.O. Antibiotics - continue antibiotics prescribed by PCP 1. My suggestion at this time is good to be that we going to continue with the current wound care measures although we will discontinue the [facility] will use just Xeroform over top of this for the time being due to the fact that the wound is becoming so dry that everything is seeming to stick to it. I think the Xeroform will be a good option. 2. I am also can recommend at this point that we continue to have the patient elevate her legs is much as possible. 3. I am also recommend that the patient continue with normal activity I think she can walk and get around without complication I see no issues in this regard. SYREETA, FIGLER ([id number]) We will  see patient back for reevaluation in 1 week here in the clinic. If anything worsens or changes patient will contact our office for additional recommendations. Electronic Signature(s) Signed: 10/14/2019 3:14:47 PM By: Lenda Kelp PA-C Previous Signature: 10/14/2019 3:13:47 PM Version By: Lenda Kelp PA-C Entered By: Lenda Kelp on 10/14/2019 15:14:46 Rozelle, Roselyn Bering ([id number]) --------------------------------------------------------------------------------  SuperBill Details Patient Name: MATTALYNN, CRANDLE Date of Service: 10/14/2019 Medical Record Number: [id number] Patient Account Number: [id number] Date of Birth/Sex: 1931/09/30 (86 y.o. F) Treating RN: Army Melia Primary Care Provider: Otilio Miu Other Clinician: Referring Provider: Otilio Miu Treating Provider/Extender: STONE III, Anael Rosch Weeks in Treatment: 3 Diagnosis Coding ICD-10 Codes Code Description S80.11XA Contusion of right lower leg, initial encounter S81.801A Unspecified open wound, right lower leg, initial encounter Esmeralda (primary) hypertension Facility Procedures CPT4 Code: [id number] Description: 46503 - DEB SUBQ TISSUE 20 SQ CM/< Modifier: Quantity: 1 CPT4 Code: Description: ICD-10 Diagnosis Description S81.801A Unspecified open wound, right lower leg, initial encounter Modifier: Quantity: Physician Procedures CPT4 Code: [id number] Description: 11042 - WC PHYS SUBQ TISS 20 SQ CM Modifier: Quantity: 1 CPT4 Code: Description: ICD-10 Diagnosis Description E75.170Y Unspecified open wound, right lower leg, initial encounter Modifier: Quantity: Electronic Signature(s) Signed: 10/14/2019 3:15:00 PM By: Worthy Keeler PA-C Previous Signature: 10/14/2019 3:13:57 PM Version By: Worthy Keeler PA-C Entered By: Worthy Keeler on 10/14/2019 15:15:00

## 2019-10-21 ENCOUNTER — Other Ambulatory Visit: Payer: Self-pay

## 2019-10-21 ENCOUNTER — Encounter: Payer: Medicare Other | Admitting: Physician Assistant

## 2019-10-21 DIAGNOSIS — I10 Essential (primary) hypertension: Secondary | ICD-10-CM | POA: Diagnosis not present

## 2019-10-21 DIAGNOSIS — S8011XA Contusion of right lower leg, initial encounter: Secondary | ICD-10-CM | POA: Diagnosis not present

## 2019-10-21 DIAGNOSIS — S81801A Unspecified open wound, right lower leg, initial encounter: Secondary | ICD-10-CM | POA: Diagnosis not present

## 2019-10-22 NOTE — Progress Notes (Signed)
SHADAYA, MARSCHNER ([id number]) Visit Report for 10/21/2019 Arrival Information Details Patient Name: Karen Hooper, Karen Hooper Date of Service: 10/21/2019 1:45 PM Medical Record Number: [id number] Patient Account Number: [id number] Date of Birth/Sex: 1932-03-29 (86 y.o. F) Treating RN: Rodell Perna Primary Care Jurney Overacker: Elizabeth Sauer Other Clinician: Referring Cashlynn Yearwood: Elizabeth Sauer Treating Ahmani Daoud/Extender: Linwood Dibbles, HOYT Weeks in Treatment: 4 Visit Information History Since Last Visit Added or deleted any medications: No Patient Arrived: Ambulatory Any new allergies or adverse reactions: No Arrival Time: 13:50 Had a fall or experienced change in No Accompanied By: husband activities of daily living that may affect Transfer Assistance: None risk of falls: Patient Identification Verified: Yes Signs or symptoms of abuse/neglect since last visito No Secondary Verification Process Completed: Yes Hospitalized since last visit: No Patient Requires Transmission-Based Precautions: No Implantable device outside of the clinic excluding No Patient Has Alerts: No cellular tissue based products placed in the center since last visit: Has Dressing in Place as Prescribed: Yes Pain Present Now: No Electronic Signature(s) Signed: 10/21/2019 4:07:53 PM By: Dayton Martes RCP, RRT, CHT Entered By: Dayton Martes on 10/21/2019 13:50:54 Karen Hooper ([id number]) -------------------------------------------------------------------------------- Clinic Level of Care Assessment Details Patient Name: Karen Hooper Date of Service: 10/21/2019 1:45 PM Medical Record Number: [id number] Patient Account Number: [id number] Date of Birth/Sex: July 26, 1931 (87 y.o. F) Treating RN: Rodell Perna Primary Care Baleria Wyman: Elizabeth Sauer Other Clinician: Referring Vernice Bowker: Elizabeth Sauer Treating Shakeya Kerkman/Extender: Linwood Dibbles, HOYT Weeks in Treatment: 4 Clinic Level of Care Assessment Items TOOL 4  Quantity Score []  - Use when only an EandM is performed on FOLLOW-UP visit 0 ASSESSMENTS - Nursing Assessment / Reassessment X - Reassessment of Co-morbidities (includes updates in patient status) 1 10 X- 1 5 Reassessment of Adherence to Treatment Plan ASSESSMENTS - Wound and Skin Assessment / Reassessment X - Simple Wound Assessment / Reassessment - one wound 1 5 []  - 0 Complex Wound Assessment / Reassessment - multiple wounds []  - 0 Dermatologic / Skin Assessment (not related to wound area) ASSESSMENTS - Focused Assessment []  - Circumferential Edema Measurements - multi extremities 0 []  - 0 Nutritional Assessment / Counseling / Intervention X- 1 5 Lower Extremity Assessment (monofilament, tuning fork, pulses) []  - 0 Peripheral Arterial Disease Assessment (using hand held doppler) ASSESSMENTS - Ostomy and/or Continence Assessment and Care []  - Incontinence Assessment and Management 0 []  - 0 Ostomy Care Assessment and Management (repouching, etc.) PROCESS - Coordination of Care X - Simple Patient / Family Education for ongoing care 1 15 []  - 0 Complex (extensive) Patient / Family Education for ongoing care X- 1 10 Staff obtains , Records, Test Results / Process Orders []  - 0 Staff telephones HHA, Nursing Homes / Clarify orders / etc []  - 0 Routine Transfer to another Facility (non-emergent condition) []  - 0 Routine Hospital Admission (non-emergent condition) []  - 0 New Admissions / / Ordering NPWT, Apligraf, etc. []  - 0 Emergency Hospital Admission (emergent condition) X- 1 10 Simple Discharge Coordination []  - 0 Complex (extensive) Discharge Coordination PROCESS - Special Needs []  - Pediatric / Minor Patient Management 0 []  - 0 Isolation Patient Management []  - 0 Hearing / Language / Visual special needs []  - 0 Assessment of Community assistance (transportation, D/C planning, etc.) Trinkle, Carlethia W. ( ) []  - 0 Additional  assistance / Altered mentation []  - 0 Support Surface(s) Assessment (bed, cushion, seat, etc.) INTERVENTIONS - Wound Cleansing / Measurement X - Simple Wound Cleansing - one wound 1 5 []  -  0 Complex Wound Cleansing - multiple wounds X- 1 5 Wound Imaging (photographs - any number of wounds) []  - 0 Wound Tracing (instead of photographs) X- 1 5 Simple Wound Measurement - one wound []  - 0 Complex Wound Measurement - multiple wounds INTERVENTIONS - Wound Dressings []  - Small Wound Dressing one or multiple wounds 0 []  - 0 Medium Wound Dressing one or multiple wounds []  - 0 Large Wound Dressing one or multiple wounds []  - 0 Application of Medications - topical []  - 0 Application of Medications - injection INTERVENTIONS - Miscellaneous []  - External ear exam 0 []  - 0 Specimen Collection (cultures, biopsies, blood, body fluids, etc.) []  - 0 Specimen(s) / Culture(s) sent or taken to Lab for analysis []  - 0 Patient Transfer (multiple staff / / Similar devices) []  - 0 Simple Staple / Suture removal (25 or less) []  - 0 Complex Staple / Suture removal (26 or more) []  - 0 Hypo / Hyperglycemic Management (close monitor of Blood Glucose) []  - 0 Ankle / Brachial Index (ABI) - do not check if billed separately X- 1 5 Vital Signs Has the patient been seen at the hospital within the last three years: Yes Total Score: 80 Level Of Care: New/Established - Level 3 Electronic Signature(s) Signed: 10/21/2019 4:09:07 PM By: Entered By: on 10/21/2019 14:11:23 ( ) -------------------------------------------------------------------------------- Encounter Discharge Information Details Patient Name: Date of Service: 10/21/2019 1:45 PM Medical Record Number: Patient Account Number: Date of Birth/Sex: 30-Jun-1932 (86 y.o. F) Treating RN: Primary Care Lynnsie Linders: Other  Clinician: Referring Latwan Luchsinger: Treating Marcellene Shivley/Extender: 12/21/2019, HOYT Weeks in Treatment: 4 Encounter Discharge Information Items Discharge Condition: Stable Ambulatory Status: Ambulatory Discharge Destination: Home Transportation: Private Auto Accompanied By: self Schedule Follow-up Appointment: Yes Clinical Summary of Care: Electronic Signature(s) Signed: 10/21/2019 4:09:07 PM By: Rodell Perna Entered By: 12/21/2019 on 10/21/2019 14:11:50 [id number] (Karen Hooper) -------------------------------------------------------------------------------- Lower Extremity Assessment Details Patient Name: 12/21/2019 Date of Service: 10/21/2019 1:45 PM Medical Record Number: [id number] Patient Account Number: 05/13/1932 Date of Birth/Sex: [birth year] (87 y.o. F) Treating RN: Elizabeth Sauer Primary Care Jaimon Bugaj: Elizabeth Sauer Other Clinician: Referring Jaycion Treml: Linwood Dibbles Treating Dianne Bady/Extender: 12/21/2019, HOYT Weeks in Treatment: 4 Vascular Assessment Pulses: Dorsalis Pedis Palpable: [Right:Yes] Electronic Signature(s) Signed: 10/22/2019 4:14:56 PM By: Rodell Perna, BSN, RN, CWS, Kim RN, BSN Entered By: 12/21/2019, BSN, RN, CWS, Kim on 10/21/2019 13:57:09 [id number] (Karen Hooper) -------------------------------------------------------------------------------- Multi Wound Chart Details Patient Name: 12/21/2019 Date of Service: 10/21/2019 1:45 PM Medical Record Number: [id number] Patient Account Number: 05/13/1932 Date of Birth/Sex: 08-Jun-1932 (87 y.o. F) Treating RN: Elizabeth Sauer Primary Care Kyriakos Babler: Elizabeth Sauer Other Clinician: Referring Jakaden Ouzts: Linwood Dibbles Treating Koltin Wehmeyer/Extender: STONE III, HOYT Weeks in Treatment: 4 Vital Signs Height(in): 64 Pulse(bpm): 54 Weight(lbs): 156 Blood Pressure(mmHg): 119/49 Body Mass Index(BMI): 27 Temperature(F): 97.5 Respiratory Rate(breaths/min): 16 Photos: [N/A:N/A] Wound Location: Right Lower Leg N/A  N/A Wounding Event: Trauma N/A N/A Primary Etiology: Trauma, Other N/A N/A Comorbid History: Hypertension, Osteoarthritis N/A N/A Date Acquired: 08/26/2019 N/A N/A Weeks of Treatment: 4 N/A N/A Wound Status: Healed - Epithelialized N/A N/A Measurements L x W x D (cm) 0x0x0 N/A N/A Area (cm) : 0 N/A N/A Volume (cm) : 0 N/A N/A % Reduction in Area: 100.00% N/A N/A % Reduction in Volume: 100.00% N/A N/A Classification: Full Thickness Without Exposed N/A N/A Support Structures Exudate Amount: Medium N/A  N/A Exudate Type: Serous N/A N/A Exudate Color: amber N/A N/A Wound Margin: Flat and Intact N/A N/A Granulation Amount: None Present (0%) N/A N/A Necrotic Amount: None Present (0%) N/A N/A Exposed Structures: Fascia: No N/A N/A Fat Layer (Subcutaneous Tissue) Exposed: No Tendon: No Muscle: No Joint: No Bone: No Epithelialization: Large (67-100%) N/A N/A Treatment Notes Electronic Signature(s) Signed: 10/21/2019 4:09:07 PM By: Army Melia Entered By: Army Melia on 10/21/2019 14:10:36 [name] ([id number]) -------------------------------------------------------------------------------- [place] Details Patient Name: [name] Date of Service: 10/21/2019 1:45 PM Medical Record Number: [id number] Patient Account Number: [id number] Date of Birth/Sex: [birth year] (86 y.o. F) Treating RN: Cornell Barman Primary Care Janann Boeve: Otilio Miu Other Clinician: Referring Nahuel Wilbert: Otilio Miu Treating Wasim Hurlbut/Extender: Melburn Hake, HOYT Weeks in Treatment: 4 Active Inactive Electronic Signature(s) Signed: 10/21/2019 4:09:07 PM By: Army Melia Signed: 10/22/2019 4:14:56 PM By: Gretta Cool, BSN, RN, CWS, Kim RN, BSN Entered By: Army Melia on 10/21/2019 14:10:26 [name] ([id number]) -------------------------------------------------------------------------------- Pain Assessment Details Patient Name: [name] Date of Service: 10/21/2019 1:45  PM Medical Record Number: [id number] Patient Account Number: [id number] Date of Birth/Sex: [birth year] (86 y.o. F) Treating RN: Cornell Barman Primary Care Bashar Milam: Otilio Miu Other Clinician: Referring Missey Hasley: Otilio Miu Treating Kamika Goodloe/Extender: Melburn Hake, HOYT Weeks in Treatment: 4 Active Problems Location of Pain Severity and Description of Pain Patient Has Paino No Site Locations Pain Management and Medication Current Pain Management: Notes Patient denies pain at this time. Electronic Signature(s) Signed: 10/22/2019 4:14:56 PM By: Gretta Cool, BSN, RN, CWS, Kim RN, BSN Entered By: Gretta Cool, BSN, RN, CWS, Kim on 10/21/2019 13:56:41 [name] ([id number]) -------------------------------------------------------------------------------- Patient/Caregiver Education Details Patient Name: [name] Date of Service: 10/21/2019 1:45 PM Medical Record Number: [id number] Patient Account Number: [id number] Date of Birth/Gender: July 28, 1931 (87 y.o. F) Treating RN: Army Melia Primary Care Physician: Otilio Miu Other Clinician: Referring Physician: Otilio Miu Treating Physician/Extender: Melburn Hake, HOYT Weeks in Treatment: 4 Education Assessment Education Provided To: Patient Education Topics Provided Wound/Skin Impairment: Handouts: Caring for Your Ulcer Methods: Demonstration, Explain/Verbal Responses: State content correctly Electronic Signature(s) Signed: 10/21/2019 4:09:07 PM By: Army Melia Entered By: Army Melia on 10/21/2019 14:11:33 [name] ([id number]) -------------------------------------------------------------------------------- Wound Assessment Details Patient Name: [name] Date of Service: 10/21/2019 1:45 PM Medical Record Number: [id number] Patient Account Number: [id number] Date of Birth/Sex: [birth year] (87 y.o. F) Treating RN: Army Melia Primary Care Velma Agnes: Otilio Miu Other Clinician: Referring Kylian Loh: Otilio Miu Treating  Perle Gibbon/Extender: STONE III, HOYT Weeks in Treatment: 4 Wound Status Wound Number: 1 Primary Etiology: Trauma, Other Wound Location: Right Lower Leg Wound Status: Healed - Epithelialized Wounding Event: Trauma Comorbid History: Hypertension, Osteoarthritis Date Acquired: 08/26/2019 Weeks Of Treatment: 4 Clustered Wound: No Photos Wound Measurements Length: (cm) Width: (cm) Depth: (cm) Area: (cm) Volume: (cm) 0 % Reduction in Area: 100% 0 % Reduction in Volume: 100% 0 Epithelialization: Large (67-100%) 0 0 Wound Description Classification: Full Thickness Without Exposed Support Structu Wound Margin: Flat and Intact Exudate Amount: Medium Exudate Type: Serous Exudate Color: amber res Foul Odor After Cleansing: No Slough/Fibrino Yes Wound Bed Granulation Amount: None Present (0%) Exposed Structure Necrotic Amount: None Present (0%) Fascia Exposed: No Fat Layer (Subcutaneous Tissue) Exposed: No Tendon Exposed: No Muscle Exposed: No Joint Exposed: No Bone Exposed: No Electronic Signature(s) Signed: 10/21/2019 4:09:07 PM By: Army Melia Entered By: Army Melia on 10/21/2019 14:09:05 [name] ([id number]) -------------------------------------------------------------------------------- Vitals Details Patient Name: [name] Date of Service: 10/21/2019 1:45 PM  Medical Record Number: [id number] Patient Account Number: [id number] Date of Birth/Sex: 28-Aug-1931 (86 y.o. F) Treating RN: Rodell Perna Primary Care Nazarene Bunning: Elizabeth Sauer Other Clinician: Referring Sundae Maners: Elizabeth Sauer Treating Rether Rison/Extender: STONE III, HOYT Weeks in Treatment: 4 Vital Signs Time Taken: 13:50 Temperature (F): 97.5 Height (in): 64 Pulse (bpm): 54 Weight (lbs): 156 Respiratory Rate (breaths/min): 16 Body Mass Index (BMI): 26.8 Blood Pressure (mmHg): 119/49 Reference Range: 80 - 120 mg / dl Electronic Signature(s) Signed: 10/21/2019 4:07:53 PM By: Dayton Martes RCP, RRT, CHT Entered By: Dayton Martes on 10/21/2019 13:54:04

## 2019-10-23 NOTE — Progress Notes (Signed)
AMBERLI, RUEGG ([id number]) Visit Report for 10/21/2019 Chief Complaint Document Details Patient Name: Karen Hooper, Karen Hooper Date of Service: 10/21/2019 1:45 PM Medical Record Number: [id number] Patient Account Number: [id number] Date of Birth/Sex: [birth year] (86 y.o. F) Treating RN: Rodell Perna Primary Care Provider: Elizabeth Sauer Other Clinician: Referring Provider: Elizabeth Sauer Treating Provider/Extender: Linwood Dibbles, Yosiah Jasmin Weeks in Treatment: 4 Information Obtained from: Patient Chief Complaint Right leg hematoma and ulcer Electronic Signature(s) Signed: 10/21/2019 2:11:58 PM By: Lenda Kelp PA-C Entered By: Lenda Kelp on 10/21/2019 14:11:58 Karen Hooper ([id number]) -------------------------------------------------------------------------------- HPI Details Patient Name: Karen Hooper Date of Service: 10/21/2019 1:45 PM Medical Record Number: [id number] Patient Account Number: [id number] Date of Birth/Sex: 1931/07/25 (87 y.o. F) Treating RN: Rodell Perna Primary Care Provider: Elizabeth Sauer Other Clinician: Referring Provider: Elizabeth Sauer Treating Provider/Extender: Linwood Dibbles, Brevon Dewald Weeks in Treatment: 4 History of Present Illness HPI Description: 09/23/2019 upon evaluation today patient presents for initial inspection here in our clinic concerning issues that she has been having with her right anterior lower extremity. She actually sustained a traumatic wound when she hit her leg on the handle for her recliner roughly 1 month ago. She had a hematoma but initially never went to see anyone about this. Subsequently she then ended up continuing to have issues to the point that her pastor came by and told her that he really felt like she should go get this checked out at the doctor's office. She then subsequently did go in to see her primary care provider who debrided this away this past Friday for her. Subsequently we were then able to work her in for today. Nonetheless overall the  patient does seem to be making some progress. She does have a history of hypertension but other than this really has no major medical problems and appears to be fairly healthy. 09/30/2019 upon evaluation today patient appears to be doing very well with regard to her wound. This is making good progress at this point [facility] has been utilized and overall seems to be doing excellent for her. I do think that she probably does not need any additional antibiotics at this point based on what I am seeing overall I think she is doing quite nicely. 10/07/2019 upon evaluation today patient appears to be doing well with regard to her right leg ulcer. This is measuring better and seems to be progressing in the right direction which is great news. There is no signs of active infection at this time. No fevers, chills, nausea, vomiting, or diarrhea. 10/14/2019 upon evaluation today patient appears to be doing very well in regard to her ulcer. This is measuring much better. There is a lot of dry skin around the edges but nonetheless I do believe she is making good progress. She is going require little bit of sharp debridement today to clear away some of the dry skin around the edges of the wound as well as some of the minimal slough noted on the surface of the wound. 10/21/2019 upon evaluation today patient appears to be doing excellent and in fact she appears to be completely healed in regard to her ulcer at this point. There is no signs of active infection at this time. Electronic Signature(s) Signed: 10/21/2019 2:12:06 PM By: Lenda Kelp PA-C Entered By: Lenda Kelp on 10/21/2019 14:12:05 Karen Hooper ([id number]) -------------------------------------------------------------------------------- Physical Exam Details Patient Name: Karen Hooper Date of Service: 10/21/2019 1:45 PM Medical Record Number: [id number] Patient Account Number: [id number] Date  of Birth/Sex: 05-Aug-1931 (86 y.o. F) Treating RN:  Rodell Perna Primary Care Provider: Elizabeth Sauer Other Clinician: Referring Provider: Elizabeth Sauer Treating Provider/Extender: STONE III, Hayes Rehfeldt Weeks in Treatment: 4 Constitutional Well-nourished and well-hydrated in no acute distress. Respiratory normal breathing without difficulty. Psychiatric this patient is able to make decisions and demonstrates good insight into disease process. Alert and Oriented x 3. pleasant and cooperative. Notes Patient's wound bed currently showed signs of good granulation at this point there does not appear to be any evidence of active infection and overall I am very pleased with how things seem to be progressing. Electronic Signature(s) Signed: 10/21/2019 2:12:21 PM By: Lenda Kelp PA-C Entered By: Lenda Kelp on 10/21/2019 14:12:20 Karen Hooper ([id number]) -------------------------------------------------------------------------------- Physician Orders Details Patient Name: Karen Hooper Date of Service: 10/21/2019 1:45 PM Medical Record Number: [id number] Patient Account Number: [id number] Date of Birth/Sex: 17-Feb-1932 (87 y.o. F) Treating RN: Rodell Perna Primary Care Provider: Elizabeth Sauer Other Clinician: Referring Provider: Elizabeth Sauer Treating Provider/Extender: Linwood Dibbles, Malaya Cagley Weeks in Treatment: 4 Verbal / Phone Orders: No Diagnosis Coding Discharge From [facility] Services o Discharge from [facility] Center - Treatment complete Electronic Signature(s) Signed: 10/21/2019 4:09:07 PM By: Rodell Perna Signed: 10/22/2019 6:10:11 PM By: Lenda Kelp PA-C Entered By: Rodell Perna on 10/21/2019 14:11:00 Karen Hooper ([id number]) -------------------------------------------------------------------------------- Problem List Details Patient Name: Karen Hooper Date of Service: 10/21/2019 1:45 PM Medical Record Number: [id number] Patient Account Number: [id number] Date of Birth/Sex: 1931-10-10 (86 y.o. F) Treating RN: Rodell Perna Primary Care Provider: Elizabeth Sauer Other Clinician: Referring Provider: Elizabeth Sauer Treating Provider/Extender: Linwood Dibbles, Bethsaida Siegenthaler Weeks in Treatment: 4 Active Problems ICD-10 Evaluated Encounter Code Description Active Date Today Diagnosis S80.11XA Contusion of right lower leg, initial encounter 09/23/2019 No Yes S81.801A Unspecified open wound, right lower leg, initial encounter 09/23/2019 No Yes I10 Essential (primary) hypertension 09/23/2019 No Yes Inactive Problems Resolved Problems Electronic Signature(s) Signed: 10/21/2019 2:11:38 PM By: Lenda Kelp PA-C Entered By: Lenda Kelp on 10/21/2019 14:11:38 Karen Hooper ([id number]) -------------------------------------------------------------------------------- Progress Note Details Patient Name: Karen Hooper Date of Service: 10/21/2019 1:45 PM Medical Record Number: [id number] Patient Account Number: [id number] Date of Birth/Sex: Nov 01, 1931 (87 y.o. F) Treating RN: Rodell Perna Primary Care Provider: Elizabeth Sauer Other Clinician: Referring Provider: Elizabeth Sauer Treating Provider/Extender: Linwood Dibbles, Jahayra Mazo Weeks in Treatment: 4 Subjective Chief Complaint Information obtained from Patient Right leg hematoma and ulcer History of Present Illness (HPI) 09/23/2019 upon evaluation today patient presents for initial inspection here in our clinic concerning issues that she has been having with her right anterior lower extremity. She actually sustained a traumatic wound when she hit her leg on the handle for her recliner roughly 1 month ago. She had a hematoma but initially never went to see anyone about this. Subsequently she then ended up continuing to have issues to the point that her pastor came by and told her that he really felt like she should go get this checked out at the doctor's office. She then subsequently did go in to see her primary care provider who debrided this away this past Friday for her. Subsequently  we were then able to work her in for today. Nonetheless overall the patient does seem to be making some progress. She does have a history of hypertension but other than this really has no major medical problems and appears to be fairly healthy. 09/30/2019 upon evaluation today patient appears to be doing very well with regard  to her wound. This is making good progress at this point [facility] has been utilized and overall seems to be doing excellent for her. I do think that she probably does not need any additional antibiotics at this point based on what I am seeing overall I think she is doing quite nicely. 10/07/2019 upon evaluation today patient appears to be doing well with regard to her right leg ulcer. This is measuring better and seems to be progressing in the right direction which is great news. There is no signs of active infection at this time. No fevers, chills, nausea, vomiting, or diarrhea. 10/14/2019 upon evaluation today patient appears to be doing very well in regard to her ulcer. This is measuring much better. There is a lot of dry skin around the edges but nonetheless I do believe she is making good progress. She is going require little bit of sharp debridement today to clear away some of the dry skin around the edges of the wound as well as some of the minimal slough noted on the surface of the wound. 10/21/2019 upon evaluation today patient appears to be doing excellent and in fact she appears to be completely healed in regard to her ulcer at this point. There is no signs of active infection at this time. Objective Constitutional Well-nourished and well-hydrated in no acute distress. Vitals Time Taken: 1:50 PM, Height: 64 in, Weight: 156 lbs, BMI: 26.8, Temperature: 97.5 F, Pulse: 54 bpm, Respiratory Rate: 16 breaths/min, Blood Pressure: 119/49 mmHg. Respiratory normal breathing without difficulty. Psychiatric this patient is able to make decisions and demonstrates good  insight into disease process. Alert and Oriented x 3. pleasant and cooperative. General Notes: Patient's wound bed currently showed signs of good granulation at this point there does not appear to be any evidence of active infection and overall I am very pleased with how things seem to be progressing. Integumentary (Hair, Skin) Wound #1 status is Healed - Epithelialized. Original cause of wound was Trauma. The wound is located on the Right Lower Leg. The wound measures 0cm length x 0cm width x 0cm depth; 0cm^2 area and 0cm^3 volume. There is a medium amount of serous drainage noted. The wound margin is flat KERRIA, SAPIEN. ([id number]) and intact. There is no granulation within the wound bed. There is no necrotic tissue within the wound bed. Assessment Active Problems ICD-10 Contusion of right lower leg, initial encounter Unspecified open wound, right lower leg, initial encounter Essential (primary) hypertension Plan Discharge From [facility] Services: Discharge from [place] complete 1. My suggestion at this time is good to be that we go ahead and discontinue wound care services as the patient is completely healed and appears to have done very well. 2. I am also can suggest that the patient continue to monitor for any signs of worsening if she has any issues she will contact the office and let me know. We will see patient back for reevaluation in 1 week here in the clinic. If anything worsens or changes patient will contact our office for additional recommendations. Electronic Signature(s) Signed: 10/21/2019 2:12:45 PM By: Worthy Keeler PA-C Entered By: Worthy Keeler on 10/21/2019 14:12:45 [name] ([id number]) -------------------------------------------------------------------------------- SuperBill Details Patient Name: [name] Date of Service: 10/21/2019 Medical Record Number: [id number] Patient Account Number: [id number] Date of Birth/Sex: 06-14-1932 (87  y.o. F) Treating RN: Army Melia Primary Care Provider: Otilio Miu Other Clinician: Referring Provider: Otilio Miu Treating Provider/Extender: STONE III, Librada Castronovo Weeks  in Treatment: 4 Diagnosis Coding ICD-10 Codes Code Description S80.11XA Contusion of right lower leg, initial encounter S81.801A Unspecified open wound, right lower leg, initial encounter I10 Essential (primary) hypertension Facility Procedures CPT4 Code: 11941740 Description: 99213 - WOUND CARE VISIT-LEV 3 EST PT Modifier: Quantity: 1 Physician Procedures CPT4 Code: 8144818 Description: 56314 - WC PHYS LEVEL 2 - EST PT Modifier: Quantity: 1 CPT4 Code: Description: ICD-10 Diagnosis Description S80.11XA Contusion of right lower leg, initial encounter S81.801A Unspecified open wound, right lower leg, initial encounter I10 Essential (primary) hypertension Modifier: Quantity: Electronic Signature(s) Signed: 10/21/2019 2:13:18 PM By: Lenda Kelp PA-C Previous Signature: 10/21/2019 2:13:04 PM Version By: Lenda Kelp PA-C Entered By: Lenda Kelp on 10/21/2019 14:13:17

## 2019-10-30 DIAGNOSIS — M1712 Unilateral primary osteoarthritis, left knee: Secondary | ICD-10-CM | POA: Diagnosis not present

## 2019-12-05 ENCOUNTER — Ambulatory Visit (INDEPENDENT_AMBULATORY_CARE_PROVIDER_SITE_OTHER): Payer: Medicare Other

## 2019-12-05 ENCOUNTER — Ambulatory Visit
Admission: EM | Admit: 2019-12-05 | Discharge: 2019-12-05 | Disposition: A | Discharge status: Home / Self Care | Payer: Medicare Other | Attending: Emergency Medicine | Admitting: Emergency Medicine

## 2019-12-05 ENCOUNTER — Other Ambulatory Visit: Payer: Self-pay

## 2019-12-05 ENCOUNTER — Encounter: Payer: Self-pay | Admitting: Emergency Medicine

## 2019-12-05 DIAGNOSIS — S41111A Laceration without foreign body of right upper arm, initial encounter: Secondary | ICD-10-CM

## 2019-12-05 DIAGNOSIS — S51811A Laceration without foreign body of right forearm, initial encounter: Secondary | ICD-10-CM | POA: Diagnosis not present

## 2019-12-05 DIAGNOSIS — W19XXXA Unspecified fall, initial encounter: Secondary | ICD-10-CM

## 2019-12-05 DIAGNOSIS — S40021A Contusion of right upper arm, initial encounter: Secondary | ICD-10-CM | POA: Diagnosis not present

## 2019-12-05 MED ORDER — MUPIROCIN 2 % EX OINT: TOPICAL_OINTMENT | CUTANEOUS | 0 refills | Status: DC

## 2019-12-05 MED ORDER — CEPHALEXIN 500 MG PO CAPS: 500.0000 mg | ORAL_CAPSULE | Freq: Three times a day (TID) | ORAL | 0 refills | Status: AC

## 2019-12-05 NOTE — ED Triage Notes (Signed)
Patient c/o falling down 3 steps today hitting her right arm on the brick steps. Skin tear to right arm.

## 2019-12-05 NOTE — ED Provider Notes (Signed)
[facility] ____________________________________________  Time seen: Approximately 5:43 PM  I have reviewed the triage vital signs and the nursing notes.   HISTORY  Chief Complaint Fall and Arm Injury  HPI Karen Hooper is a 86 y.o. female presenting with son at bedside for evaluation of right arm skin tear.  Patient reports that 2 hours ago she was carrying empty drink bottles outside, and as she was going down the 5 outside steps she dropped a bottle and caused her to trip.  Reports that she fell landing on her right arm, approximately 3 steps.  Denies head injury or loss of consciousness.  Denies any other pain or injury.  Denies syncope, chest pain, shortness of breath.  Also reports has been dealing with chronic knee pain which contributes to her falling, denies any acute knee pain from today.  Reports tetanus immunization is up-to-date, last approximately 2 months ago.  States no pain at this time.  States had some pain to right arm earlier.  Reports large skin tear prompting them to come in tonight.  Reports otherwise doing well.  Denies renal insufficiency.  Juline Patch, MD : PCP   Past Medical History:  Diagnosis Date  . Allergy   . Anxiety   . Hyperlipidemia   . Hypertension     Patient Active Problem List   Diagnosis Date Noted  . Epigastric abdominal pain   . Acute peptic ulcer of stomach   . Postpyloric ulcer   . Abnormal CT scan, small bowel   . CKD (chronic kidney disease) stage 3, GFR 30-59 ml/min 06/25/2015  . 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 Allergic rhinitis 11/13/2014  . Anxiety 11/13/2014  . Essential hypertension 11/13/2014  . Routine general medical examination at a health care facility 11/13/2014  . Hot flash, menopausal 11/13/2014  . Screening for depression 11/13/2014    Past Surgical History:  Procedure Laterality Date  . CATARACT EXTRACTION, BILATERAL    . ESOPHAGOGASTRODUODENOSCOPY (EGD)  WITH PROPOFOL N/A 09/07/2018   Procedure: ESOPHAGOGASTRODUODENOSCOPY (EGD) WITH PROPOFOL;  Surgeon: Virgel Manifold, MD;  Location: [facility] END[facility]COPY;  Service: Endoscopy;  Laterality: N/A;  . EYE SURGERY    . VAGINAL HYSTERECTOMY       No current facility-administered medications for this encounter.  Current Outpatient Medications:  .  lisinopril (ZESTRIL) 10 MG tablet, Take 1 tablet (10 mg total) by mouth daily., Disp: 90 tablet, Rfl: 1 .  metoprolol succinate (TOPROL-XL) 50 MG 24 hr tablet, TAKE (1) TABLET BY MOUTH EVERY DAY, Disp: 90 tablet, Rfl: 1 .  mirtazapine (REMERON) 15 MG tablet, Take 0.5 tablets (7.5 mg total) by mouth at bedtime., Disp: 15 tablet, Rfl: 5 .  Omega 3 1000 MG CAPS, Take 2 capsules (2,000 mg total) by mouth 2 (two) times daily., Disp: , Rfl:  .  polyethylene glycol (MIRALAX) packet, Take 17 g by mouth daily., Disp: 14 each, Rfl: 0 .  calcium carbonate (OS-CAL - DOSED IN MG OF ELEMENTAL CALCIUM) 1250 (500 Ca) MG tablet, Take 1 tablet by mouth., Disp: , Rfl:  .  cephALEXin (KEFLEX) 500 MG capsule, Take 1 capsule (500 mg total) by mouth 3 (three) times daily for 7 days., Disp: 21 capsule, Rfl: 0 .  mupirocin ointment (BACTROBAN) 2 %, Apply 1-2 times a day for 7 days., Disp: 22 g, Rfl: 0  Allergies Atorvastatin, Etodolac, and Ezetimibe  Family History  Problem Relation Age of Onset  . Breast cancer Sister 63  . Breast cancer Daughter  64    Social History Social History   Tobacco Use  . Smoking status: Never Smoker  . Smokeless tobacco: Never Used  Substance Use Topics  . Alcohol use: No    Alcohol/week: 0.0 standard drinks  . Drug use: Never    Review of Systems Constitutional: No fever/chills Eyes: No visual changes. ENT: No sore throat. Cardiovascular: Denies chest pain. Respiratory: Denies shortness of breath. Musculoskeletal: Negative for neck or back pain. Skin: Positive laceration. Neurological: Negative for headaches, focal weakness or  numbness. ____________________________________________   PHYSICAL EXAM:  VITAL SIGNS: ED Triage Vitals  Enc Vitals Group     BP 12/05/19 1629 (!) 131/47     Pulse Rate 12/05/19 1629 60     Resp 12/05/19 1629 18     Temp 12/05/19 1629 97.8 F (36.6 C)     Temp Source 12/05/19 1629 Oral     SpO2 12/05/19 1629 97 %     Weight 12/05/19 1627 152 lb (68.9 kg)     Height 12/05/19 1623 5\' 4"  (1.626 m)     Head Circumference --      Peak Flow --      Pain Score 12/05/19 1622 5     Pain Loc --      Pain Edu? --      Excl. in GC? --     Constitutional: Alert and oriented. Well appearing and in no acute distress. Eyes: Conjunctivae are normal.  ENT      Head: Normocephalic and atraumatic. Cardiovascular: Normal rate, regular rhythm. Grossly normal heart sounds.  Good peripheral circulation. Respiratory: Normal respiratory effort without tachypnea nor retractions. Breath sounds are clear and equal bilaterally. No wheezes, rales, rhonchi. Musculoskeletal: No midline cervical, thoracic or lumbar tenderness palpation. Neurologic:  Normal speech and language. No gross focal neurologic deficits are appreciated. Speech is normal. No gait instability.  Skin:  Skin is warm, dry  Except: Right lateral proximal forearm 9 cm flap-like laceration, some superficial dirt, no tendon visualized, no bone visualized, minimal tenderness, right upper extremity with full range of motion present, no elbow pain, able to pronate and supinate the elbow as well as wrist, wrist nontender, hand grip strong. Psychiatric: Mood and affect are normal. Speech and behavior are normal. Patient exhibits appropriate insight and judgment   ___________________________________________   LABS (all labs ordered are listed, but only abnormal results are displayed)  Labs Reviewed - No data to display  RADIOLOGY  DG Forearm Right  Result Date: 12/05/2019 CLINICAL DATA:  Fall, pain EXAM: RIGHT FOREARM - 2 VIEW COMPARISON:   None. FINDINGS: No fracture or malalignment. No significant elbow effusion. Gas within the dorsal tissues of the proximal forearm consistent with history of laceration. Punctate densities overlying the skin may reflect small superficial foreign body. Widened scapholunate interval at the wrist suggestive of possible ligament abnormality IMPRESSION: 1. No acute osseous abnormality 2. Laceration at the proximal forearm. Punctate densities at the skin surface could reflect small foreign bodies. Electronically Signed   By: 12/07/2019 M.D.   On: 12/05/2019 17:59   ____________________________________________   PROCEDURES Procedures   Procedure(s) performed:  Procedure explained and verbal consent obtained. Consent: Verbal consent obtained. Written consent not obtained. Risks and benefits: risks, benefits and alternatives were discussed including infection, retained foreign body. Patient identity confirmed: verbally with patient  Consent given by: patient   Laceration Repair Location: Right forearm Length: None centimeters Foreign bodies: Superficial dirt debris removed Tendon involvement: none Nerve involvement: none Preparation:  Patient was prepped and draped in the usual sterile fashion. Anesthesia with 1% Lidocaine with epi 5 mls Cleaned with Betadine Irrigation solution: Sterile water Irrigation method: jet lavage Amount of cleaning: copious Repaired with 5-0 nylon Number of sutures: 15 Technique: simple interrupted  Approximation: loose Patient tolerate well. Wound well approximated post repair.  Antibiotic ointment and dressing applied.  Wound care instructions provided.  Observe for any signs of infection or other problems.      INITIAL IMPRESSION / ASSESSMENT AND PLAN / ED COURSE  Pertinent labs & imaging results that were available during my care of the patient were reviewed by me and considered in my medical decision making (see chart for details).  Well-appearing  patient.  Son at bedside.  Patient reports mechanical fall on front brick steps injuring right forearm.  Declines other injuries.  No head injury.  Right forearm x-ray as above, no acute osseous abnormality, radiology mention possible ligament abnormality at wrist, wrist nontender and with full range of motion present.  X-ray also did pick the laceration with possible superficial circumflex foreign bodies.  Foreign bodies irrigated and removed, no retained visualized.  Wound repaired as above.  Encouraged wound check in 3 days.  Prophylactic Keflex and topical Bactroban.  Encourage elevation, ice, especially tonight.  Discussed wound care and cleaning.  Suture removal 10 days.Discussed indication, risks and benefits of medications with patient, son and daughter.  Discussed follow up with Primary care physician this week. Discussed follow up and return parameters including no resolution or any worsening concerns. Patient verbalized understanding and agreed to plan.   ____________________________________________   FINAL CLINICAL IMPRESSION(S) / ED DIAGNOSES  Final diagnoses:  Arm laceration, right, initial encounter  Arm contusion, right, initial encounter  Fall, initial encounter     ED Discharge Orders         Ordered    cephALEXin (KEFLEX) 500 MG capsule  3 times daily     12/05/19 1846    mupirocin ointment (BACTROBAN) 2 %     12/05/19 1846           Note: This dictation was prepared with Dragon dictation along with smaller phrase technology. Any transcriptional errors that result from this process are unintentional.         Renford Dills, NP 12/05/19 1945

## 2019-12-05 NOTE — Discharge Instructions (Addendum)
Take medication as prescribed. Rest the area. Ice. Elevate. Keep clean. Allow open to air time.   Wound recheck in 3 days at urgent care or primary care.  Suture removal in 10 days at urgent care or primary care.  Follow up with your primary care physician this week as needed. Return to Urgent care for new or worsening concerns.

## 2019-12-09 ENCOUNTER — Ambulatory Visit
Admission: EM | Admit: 2019-12-09 | Discharge: 2019-12-09 | Disposition: A | Discharge status: Home / Self Care | Payer: Medicare Other | Attending: Family Medicine | Admitting: Family Medicine

## 2019-12-09 ENCOUNTER — Other Ambulatory Visit: Payer: Self-pay

## 2019-12-09 DIAGNOSIS — Z5189 Encounter for other specified aftercare: Secondary | ICD-10-CM

## 2019-12-09 NOTE — ED Triage Notes (Addendum)
Pt here for wound check to laceration repair to right forearm. Has been on Keflex, denies fever or drainage. Daughter concerned with increasing confusion

## 2019-12-09 NOTE — ED Provider Notes (Signed)
[facility] ____________________________________________  Time seen: Approximately 9:57 AM  I have reviewed the triage vital signs and the nursing notes.   HISTORY  Chief Complaint Wound Check   HPI Karen Hooper is a 86 y.o. female presenting with daughter at bedside for right forearm wound check.  Patient was seen this past Thursday after a fall hitting her right arm and had large laceration.  Patient laceration was repaired and patient has been on Bactroban and Keflex.  Patient and daughter reports she has been tolerating these medications well.  States wound is healing well and they have been allowing open air time.  Denies fevers, drainage or pain.  States had some soreness initially the day after but none continuing.  Continues to have full range of motion present.  States that the wound is healing well.  Denies immunizations up-to-date.  Daughter initially reported concern of mother's confusion, however in further discussion, daughter reports mother has dementia.  States this is happened in the past when she had an injury.  Daughter states that Friday Saturday her mother was her normal, but reports last night she was up more than normal and confused.  Daughter states that this has happened in the past multiple times.  Mother states that she has been planning to talk to patient's primary regarding dementia diagnoses.  Conversation had with patient and daughter, any concerns of head injury from recent fall, and they denied any head injury or loss of consciousness.  Patient denies any headache, vision changes, dizziness, weakness, unilateral weakness.  Daughter denies further imaging needed for mother at this time, stating no head trauma from fall.  Duanne Limerick, MD : PCP    Past Medical History:  Diagnosis Date  . Allergy   . Anxiety   . Hyperlipidemia   . Hypertension     Patient Active Problem List   Diagnosis Date Noted  . Epigastric abdominal pain   .  Acute peptic ulcer of stomach   . Postpyloric ulcer   . Abnormal CT scan, small bowel   . CKD (chronic kidney disease) stage 3, GFR 30-59 ml/min 06/25/2015  . 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 Allergic rhinitis 11/13/2014  . Anxiety 11/13/2014  . Essential hypertension 11/13/2014  . Routine general medical examination at a health care facility 11/13/2014  . Hot flash, menopausal 11/13/2014  . Screening for depression 11/13/2014    Past Surgical History:  Procedure Laterality Date  . CATARACT EXTRACTION, BILATERAL    . ESOPHAGOGASTRODUODENOSCOPY (EGD) WITH PROPOFOL N/A 09/07/2018   Procedure: ESOPHAGOGASTRODUODENOSCOPY (EGD) WITH PROPOFOL;  Surgeon: Pasty Spillers, MD;  Location: [facility] ENDOS[facility];  Service: Endoscopy;  Laterality: N/A;  . EYE SURGERY    . VAGINAL HYSTERECTOMY       No current facility-administered medications for this encounter.  Current Outpatient Medications:  .  calcium carbonate (OS-CAL - DOSED IN MG OF ELEMENTAL CALCIUM) 1250 (500 Ca) MG tablet, Take 1 tablet by mouth., Disp: , Rfl:  .  cephALEXin (KEFLEX) 500 MG capsule, Take 1 capsule (500 mg total) by mouth 3 (three) times daily for 7 days., Disp: 21 capsule, Rfl: 0 .  lisinopril (ZESTRIL) 10 MG tablet, Take 1 tablet (10 mg total) by mouth daily., Disp: 90 tablet, Rfl: 1 .  metoprolol succinate (TOPROL-XL) 50 MG 24 hr tablet, TAKE (1) TABLET BY MOUTH EVERY DAY, Disp: 90 tablet, Rfl: 1 .  mirtazapine (REMERON) 15 MG tablet, Take 0.5 tablets (7.5 mg total) by mouth at bedtime., Disp:  15 tablet, Rfl: 5 .  mupirocin ointment (BACTROBAN) 2 %, Apply 1-2 times a day for 7 days., Disp: 22 g, Rfl: 0 .  Omega 3 1000 MG CAPS, Take 2 capsules (2,000 mg total) by mouth 2 (two) times daily., Disp: , Rfl:  .  polyethylene glycol (MIRALAX) packet, Take 17 g by mouth daily., Disp: 14 each, Rfl: 0  Allergies Atorvastatin, Etodolac, and Ezetimibe  Family History  Problem Relation Age of  Onset  . Breast cancer Sister 41  . Breast cancer Daughter 45    Social History Social History   Tobacco Use  . Smoking status: Never Smoker  . Smokeless tobacco: Never Used  Substance Use Topics  . Alcohol use: No    Alcohol/week: 0.0 standard drinks  . Drug use: Never    Review of Systems Constitutional: No fever Cardiovascular: Denies chest pain. Respiratory: Denies shortness of breath. Gastrointestinal: No abdominal pain. Musculoskeletal: Negative for back pain. Skin: positive skin changes.  Neurological: Negative for headaches, focal weakness or numbness.   ____________________________________________   PHYSICAL EXAM:  VITAL SIGNS: ED Triage Vitals [12/09/19 0901]  Enc Vitals Group     BP (!) 140/53     Pulse Rate (!) 56     Resp 16     Temp 97.9 F (36.6 C)     Temp src      SpO2 100 %     Weight      Height      Head Circumference      Peak Flow      Pain Score 0     Pain Loc      Pain Edu?      Excl. in GC?     Constitutional: Alert and oriented. Well appearing and in no acute distress. Eyes: Conjunctivae are normal. ENT      Head: Normocephalic and atraumatic. Cardiovascular: Normal rate, regular rhythm. Grossly normal heart sounds.  Good peripheral circulation. Respiratory: Normal respiratory effort without tachypnea nor retractions. Breath sounds are clear and equal bilaterally. No wheezes, rales, rhonchi. Musculoskeletal: Ambulatory with steady gait.  Neurologic:  Normal speech and language. No gross focal neurologic deficits are appreciated. Speech is normal.  Skin:  Skin is warm, dry.  Except: right forearm 9 cm flap laceration well approximated, no discharge or drainage, nontender, no point tenderness, minimal erythema.  Psychiatric: Mood and affect are normal. Speech and behavior are normal. Patient exhibits appropriate insight and judgment   ___________________________________________   LABS (all labs ordered are listed, but only  abnormal results are displayed)  Labs Reviewed - No data to display ____________________________________________   PROCEDURES Procedures   INITIAL IMPRESSION / ASSESSMENT AND PLAN / ED COURSE  Pertinent labs & imaging results that were available during my care of the patient were reviewed by me and considered in my medical decision making (see chart for details).  Well-appearing patient.  Daughter at bedside.  Right forearm wound healing well, continue with current treatment plan of Keflex and topical Bactroban.  Continue wound care.  Follow-up with primary care Friday or Monday for suture removal.  Patient per daughter at baseline currently.  No focal neurological deficit.  Daughter and patient declined any head injury from recent fall or other trauma from recent fall.  Daughter denies need for further imaging of CT of head at this time.  Daughter reports she plans to follow-up with primary care this week regarding mother dementia as it appears that this is chronic.  Supportive care. Monitor.  Discussed follow up with Primary care physician this week. Discussed follow up and return parameters including no resolution or any worsening concerns. Patient verbalized understanding and agreed to plan.   ____________________________________________   FINAL CLINICAL IMPRESSION(S) / ED DIAGNOSES  Final diagnoses:  Visit for wound check     ED Discharge Orders    None       Note: This dictation was prepared with Dragon dictation along with smaller phrase technology. Any transcriptional errors that result from this process are unintentional.         Marylene Land, NP 12/09/19 1049

## 2019-12-10 ENCOUNTER — Other Ambulatory Visit: Payer: Self-pay

## 2019-12-10 ENCOUNTER — Telehealth: Payer: Self-pay | Admitting: Family Medicine

## 2019-12-10 DIAGNOSIS — R413 Other amnesia: Secondary | ICD-10-CM

## 2019-12-10 DIAGNOSIS — R4182 Altered mental status, unspecified: Secondary | ICD-10-CM

## 2019-12-10 NOTE — Telephone Encounter (Signed)
Placed referral after conversation with [name]

## 2019-12-10 NOTE — Telephone Encounter (Unsigned)
Copied from CRM [id number]. Topic: Referral - Request for Referral >> Dec 10, 2019  9:21 AM Reggie Pile, NT wrote: Has patient seen PCP for this complaint? yes *If NO, is insurance requiring patient see PCP for this issue before PCP can refer them? Referral for which specialty: neurologist Preferred provider/office: not given Reason for referral: dementia

## 2019-12-10 NOTE — Progress Notes (Unsigned)
Ref neuro placed after speaking to [facility]

## 2019-12-13 ENCOUNTER — Other Ambulatory Visit: Payer: Self-pay

## 2019-12-13 ENCOUNTER — Ambulatory Visit
Admission: EM | Admit: 2019-12-13 | Discharge: 2019-12-13 | Disposition: A | Discharge status: Home / Self Care | Payer: Medicare Other

## 2019-12-13 NOTE — ED Notes (Signed)
Removed 15 sutures from right arm. Patient tolerated well with no complications.

## 2019-12-13 NOTE — ED Triage Notes (Signed)
Patient is here for 15 sutures to be removed.

## 2020-01-02 ENCOUNTER — Other Ambulatory Visit: Payer: Self-pay

## 2020-01-02 ENCOUNTER — Ambulatory Visit: Payer: Medicare Other | Admitting: Family Medicine

## 2020-01-02 ENCOUNTER — Encounter: Payer: Self-pay | Admitting: Family Medicine

## 2020-01-02 VITALS — BP 130/80 | HR 61 | Ht 64.0 in | Wt 156.0 lb

## 2020-01-02 DIAGNOSIS — E7849 Other hyperlipidemia: Secondary | ICD-10-CM | POA: Diagnosis not present

## 2020-01-02 DIAGNOSIS — I10 Essential (primary) hypertension: Secondary | ICD-10-CM | POA: Diagnosis not present

## 2020-01-02 DIAGNOSIS — G4709 Other insomnia: Secondary | ICD-10-CM

## 2020-01-02 MED ORDER — OMEGA 3 1000 MG PO CAPS: 2.0000 | ORAL_CAPSULE | Freq: Two times a day (BID) | ORAL | Status: AC

## 2020-01-02 MED ORDER — LISINOPRIL 10 MG PO TABS: 10.0000 mg | ORAL_TABLET | Freq: Every day | ORAL | 1 refills | Status: DC

## 2020-01-02 MED ORDER — MIRTAZAPINE 15 MG PO TABS: 7.5000 mg | ORAL_TABLET | Freq: Every day | ORAL | 1 refills | Status: DC

## 2020-01-02 MED ORDER — METOPROLOL SUCCINATE ER 50 MG PO TB24: ORAL_TABLET | ORAL | 1 refills | Status: DC

## 2020-01-02 NOTE — Progress Notes (Signed)
Date:  01/02/2020   Name:  Karen Hooper   DOB:  May 31, 1932   MRN:  [id number]   Chief Complaint: Hypertension and Insomnia  Hypertension This is a chronic problem. The current episode started more than 1 year ago. The problem has been gradually improving since onset. The problem is controlled. Pertinent negatives include no anxiety, blurred vision, chest pain, headaches, malaise/fatigue, neck pain, orthopnea, palpitations, peripheral edema, PND, shortness of breath or sweats. There are no associated agents to hypertension. Past treatments include ACE inhibitors and beta blockers. The current treatment provides moderate improvement. There are no compliance problems.  There is no history of angina, kidney disease, CAD/MI, CVA, heart failure, left ventricular hypertrophy, PVD or retinopathy. There is no history of chronic renal disease, a hypertension causing med or renovascular disease.  Insomnia Primary symptoms: no fragmented sleep, no sleep disturbance, no difficulty falling asleep, no somnolence, no frequent awakening, no premature morning awakening, no malaise/fatigue, no napping.   The current episode started more than one year. The problem occurs rarely. The problem has been rapidly improving since onset. The symptoms are relieved by medication (remeron). The treatment provided moderate relief.  Hyperlipidemia This is a chronic problem. The current episode started more than 1 year ago. The problem is controlled. Recent lipid tests were reviewed and are normal. She has no history of chronic renal disease. Pertinent negatives include no chest pain, myalgias or shortness of breath. Treatments tried: omega 3. The current treatment provides moderate improvement of lipids. There are no compliance problems.     Lab Results  Component Value Date   CREATININE 1.14 (H) 07/03/2019   BUN 25 07/03/2019   NA 142 07/03/2019   K 4.6 07/03/2019   CL 105 07/03/2019   CO2 21 07/03/2019   Lab  Results  Component Value Date   CHOL 180 07/03/2019   HDL 65 07/03/2019   LDLCALC 95 07/03/2019   TRIG 115 07/03/2019   CHOLHDL 3.0 12/26/2017   No results found for: TSH No results found for: HGBA1C Lab Results  Component Value Date   WBC 8.3 08/27/2018   HGB 13.0 08/27/2018   HCT 37.9 08/27/2018   MCV 95.2 08/27/2018   PLT 217 08/27/2018   Lab Results  Component Value Date   ALT 15 07/03/2019   AST 29 07/03/2019   ALKPHOS 83 07/03/2019   BILITOT 0.5 07/03/2019     Review of Systems  Constitutional: Negative.  Negative for chills, fatigue, fever, malaise/fatigue and unexpected weight change.  HENT: Negative for congestion, ear discharge, ear pain, rhinorrhea, sinus pressure, sneezing and sore throat.   Eyes: Negative for blurred vision, photophobia, pain, discharge, redness and itching.  Respiratory: Negative for cough, shortness of breath, wheezing and stridor.   Cardiovascular: Negative for chest pain, palpitations, orthopnea and PND.  Gastrointestinal: Negative for abdominal pain, blood in stool, constipation, diarrhea, nausea and vomiting.  Endocrine: Negative for cold intolerance, heat intolerance, polydipsia, polyphagia and polyuria.  Genitourinary: Negative for dysuria, flank pain, frequency, hematuria, menstrual problem, pelvic pain, urgency, vaginal bleeding and vaginal discharge.  Musculoskeletal: Negative for arthralgias, back pain, myalgias and neck pain.  Skin: Negative for rash.  Allergic/Immunologic: Negative for environmental allergies and food allergies.  Neurological: Negative for dizziness, weakness, light-headedness, numbness and headaches.  Hematological: Negative for adenopathy. Does not bruise/bleed easily.  Psychiatric/Behavioral: Negative for dysphoric mood and sleep disturbance. The patient has insomnia. The patient is not nervous/anxious.     Patient Active Problem List  Diagnosis Date Noted  . Epigastric abdominal pain   . Acute peptic  ulcer of stomach   . Postpyloric ulcer   . Abnormal CT scan, small bowel   . CKD (chronic kidney disease) stage 3, GFR 30-59 ml/min 06/25/2015  . 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 Allergic rhinitis 11/13/2014  . Anxiety 11/13/2014  . Essential hypertension 11/13/2014  . Routine general medical examination at a health care facility 11/13/2014  . Hot flash, menopausal 11/13/2014  . Screening for depression 11/13/2014    Allergies  Allergen Reactions  . Atorvastatin   . Etodolac   . Ezetimibe     Past Surgical History:  Procedure Laterality Date  . CATARACT EXTRACTION, BILATERAL    . ESOPHAGOGASTRODUODENOSCOPY (EGD) WITH PROPOFOL N/A 09/07/2018   Procedure: ESOPHAGOGASTRODUODENOSCOPY (EGD) WITH PROPOFOL;  Surgeon: Pasty Spillers, MD;  Location: [facility];  Service: Endoscopy;  Laterality: N/A;  . EYE SURGERY    . VAGINAL HYSTERECTOMY      Social History   Tobacco Use  . Smoking status: Never Smoker  . Smokeless tobacco: Never Used  Vaping Use  . Vaping Use: Never used  Substance Use Topics  . Alcohol use: No    Alcohol/week: 0.0 standard drinks  . Drug use: Never     Medication list has been reviewed and updated.  Current Meds  Medication Sig  . calcium carbonate (OS-CAL - DOSED IN MG OF ELEMENTAL CALCIUM) 1250 (500 Ca) MG tablet Take 1 tablet by mouth.  Marland Kitchen lisinopril (ZESTRIL) 10 MG tablet Take 1 tablet (10 mg total) by mouth daily.  . metoprolol succinate (TOPROL-XL) 50 MG 24 hr tablet TAKE (1) TABLET BY MOUTH EVERY DAY  . mirtazapine (REMERON) 15 MG tablet Take 0.5 tablets (7.5 mg total) by mouth at bedtime.  . Omega 3 1000 MG CAPS Take 2 capsules (2,000 mg total) by mouth 2 (two) times daily.  . polyethylene glycol (MIRALAX) packet Take 17 g by mouth daily.    PHQ 2/9 Scores 01/02/2020 07/31/2019 07/03/2019 12/31/2018  PHQ - 2 Score 0 0 0 0  PHQ- 9 Score 0 - 0 4    GAD 7 : Generalized Anxiety Score 01/02/2020 07/03/2019    Nervous, Anxious, on Edge 0 0  Control/stop worrying 0 0  Worry too much - different things 0 0  Trouble relaxing 0 0  Restless 0 0  Easily annoyed or irritable 0 0  Afraid - awful might happen 0 0  Total GAD 7 Score 0 0  Anxiety Difficulty Not difficult at all -    BP Readings from Last 3 Encounters:  01/02/20 130/80  12/13/19 (!) 122/52  12/09/19 (!) 140/53    Physical Exam Vitals and nursing note reviewed.  Constitutional:      Appearance: She is well-developed.  HENT:     Head: Normocephalic.     Right Ear: Tympanic membrane and external ear normal.     Left Ear: Tympanic membrane and external ear normal.     Nose: Nose normal.  Eyes:     General: Lids are everted, no foreign bodies appreciated. No scleral icterus.       Left eye: No foreign body or hordeolum.     Conjunctiva/sclera: Conjunctivae normal.     Right eye: Right conjunctiva is not injected.     Left eye: Left conjunctiva is not injected.     Pupils: Pupils are equal, round, and reactive to light.  Neck:     Thyroid: No thyromegaly.  Vascular: No JVD.     Trachea: No tracheal deviation.  Cardiovascular:     Rate and Rhythm: Normal rate and regular rhythm.     Chest Wall: PMI is not displaced.     Pulses: Normal pulses.     Heart sounds: Normal heart sounds, S1 normal and S2 normal. No murmur heard.  No systolic murmur is present.  No diastolic murmur is present.  No friction rub. No gallop. No S3 or S4 sounds.   Pulmonary:     Effort: Pulmonary effort is normal. No respiratory distress.     Breath sounds: Normal breath sounds. No decreased breath sounds, wheezing, rhonchi or rales.  Abdominal:     General: Bowel sounds are normal.     Palpations: Abdomen is soft. There is no mass.     Tenderness: There is no abdominal tenderness. There is no guarding or rebound.  Musculoskeletal:        General: No tenderness. Normal range of motion.     Cervical back: Normal range of motion and neck supple.      Right lower leg: No edema.     Left lower leg: No edema.  Lymphadenopathy:     Cervical: No cervical adenopathy.  Skin:    General: Skin is warm.     Findings: No rash.     Comments: Arm healing  Neurological:     Mental Status: She is alert and oriented to person, place, and time.     Cranial Nerves: No cranial nerve deficit.     Deep Tendon Reflexes: Reflexes normal.  Psychiatric:        Mood and Affect: Mood is not anxious or depressed.     Wt Readings from Last 3 Encounters:  01/02/20 156 lb (70.8 kg)  12/05/19 152 lb (68.9 kg)  09/20/19 154 lb (69.9 kg)    BP 130/80   Pulse 61   Ht 5\' 4"  (1.626 m)   Wt 156 lb (70.8 kg)   SpO2 98%   BMI 26.78 kg/m   Assessment and Plan: 1. Essential hypertension Chronic.  Controlled.  Stable.  Continue metoprolol XL 50 mg once a day and lisinopril 10 mg once a day.  Will check renal function panel. - Renal Function Panel - metoprolol succinate (TOPROL-XL) 50 MG 24 hr tablet; TAKE (1) TABLET BY MOUTH EVERY DAY  Dispense: 90 tablet; Refill: 1 - lisinopril (ZESTRIL) 10 MG tablet; Take 1 tablet (10 mg total) by mouth daily.  Dispense: 90 tablet; Refill: 1  2. Familial multiple lipoprotein-type hyperlipidemia Chronic.  Controlled.  Stable.  Continue omega-3 take 2 capsules 2 g twice a day.  Review of lipid panel with an good range and will recheck in 6 months - Omega 3 1000 MG CAPS; Take 2 capsules (2,000 mg total) by mouth 2 (two) times daily.  Dispense: 60 capsule  3. Other insomnia .  Controlled.  Stable.  Presently on Remeron 1/2 tablet 7.5 mg q. evening.  We will continue at this time since it is doing a very good job maintaining a good sleep pattern. - mirtazapine (REMERON) 15 MG tablet; Take 0.5 tablets (7.5 mg total) by mouth at bedtime.  Dispense: 90 tablet; Refill: 1

## 2020-01-03 LAB — RENAL FUNCTION PANEL
Albumin: 4.1 g/dL (ref 3.6–4.6)
BUN/Creatinine Ratio: 23 (ref 12–28)
BUN: 25 mg/dL (ref 8–27)
CO2: 22 mmol/L (ref 20–29)
Calcium: 10.5 mg/dL — ABNORMAL HIGH (ref 8.7–10.3)
Chloride: 109 mmol/L — ABNORMAL HIGH (ref 96–106)
Creatinine, Ser: 1.07 mg/dL — ABNORMAL HIGH (ref 0.57–1.00)
GFR calc Af Amer: 55 mL/min/{1.73_m2} — ABNORMAL LOW (ref >59)
GFR calc non Af Amer: 47 mL/min/{1.73_m2} — ABNORMAL LOW (ref >59)
Glucose: 103 mg/dL — ABNORMAL HIGH (ref 65–99)
Phosphorus: 3 mg/dL (ref 3.0–4.3)
Potassium: 4.1 mmol/L (ref 3.5–5.2)
Sodium: 144 mmol/L (ref 134–144)

## 2020-01-10 ENCOUNTER — Telehealth: Payer: Self-pay | Admitting: Family Medicine

## 2020-01-10 NOTE — Telephone Encounter (Signed)
Faxed prescription to [phone]. Dx I34.37

## 2020-01-10 NOTE — Telephone Encounter (Signed)
Patients daughter wanted PCP to send in RX for rolling walker to: [facility]. [employer]. [phone]

## 2020-01-24 ENCOUNTER — Other Ambulatory Visit: Payer: Self-pay | Admitting: Neurology

## 2020-01-24 DIAGNOSIS — E538 Deficiency of other specified B group vitamins: Secondary | ICD-10-CM | POA: Diagnosis not present

## 2020-01-24 DIAGNOSIS — R413 Other amnesia: Secondary | ICD-10-CM

## 2020-01-24 DIAGNOSIS — E519 Thiamine deficiency, unspecified: Secondary | ICD-10-CM | POA: Diagnosis not present

## 2020-01-24 DIAGNOSIS — F5104 Psychophysiologic insomnia: Secondary | ICD-10-CM | POA: Diagnosis not present

## 2020-01-28 DIAGNOSIS — R32 Unspecified urinary incontinence: Secondary | ICD-10-CM | POA: Diagnosis not present

## 2020-01-28 DIAGNOSIS — Z9181 History of falling: Secondary | ICD-10-CM | POA: Diagnosis not present

## 2020-01-28 DIAGNOSIS — F0281 Dementia in other diseases classified elsewhere with behavioral disturbance: Secondary | ICD-10-CM | POA: Diagnosis not present

## 2020-01-28 DIAGNOSIS — G4700 Insomnia, unspecified: Secondary | ICD-10-CM | POA: Diagnosis not present

## 2020-01-28 DIAGNOSIS — E519 Thiamine deficiency, unspecified: Secondary | ICD-10-CM | POA: Diagnosis not present

## 2020-01-28 DIAGNOSIS — E538 Deficiency of other specified B group vitamins: Secondary | ICD-10-CM | POA: Diagnosis not present

## 2020-01-28 DIAGNOSIS — Z9183 Wandering in diseases classified elsewhere: Secondary | ICD-10-CM | POA: Diagnosis not present

## 2020-01-28 DIAGNOSIS — I129 Hypertensive chronic kidney disease with stage 1 through stage 4 chronic kidney disease, or unspecified chronic kidney disease: Secondary | ICD-10-CM | POA: Diagnosis not present

## 2020-01-28 DIAGNOSIS — F419 Anxiety disorder, unspecified: Secondary | ICD-10-CM | POA: Diagnosis not present

## 2020-01-28 DIAGNOSIS — R159 Full incontinence of feces: Secondary | ICD-10-CM | POA: Diagnosis not present

## 2020-01-28 DIAGNOSIS — N183 Chronic kidney disease, stage 3 unspecified: Secondary | ICD-10-CM | POA: Diagnosis not present

## 2020-01-28 DIAGNOSIS — R296 Repeated falls: Secondary | ICD-10-CM | POA: Diagnosis not present

## 2020-01-28 DIAGNOSIS — J309 Allergic rhinitis, unspecified: Secondary | ICD-10-CM | POA: Diagnosis not present

## 2020-01-28 DIAGNOSIS — M25562 Pain in left knee: Secondary | ICD-10-CM | POA: Diagnosis not present

## 2020-01-28 DIAGNOSIS — M150 Primary generalized (osteo)arthritis: Secondary | ICD-10-CM | POA: Diagnosis not present

## 2020-01-28 DIAGNOSIS — Z90710 Acquired absence of both cervix and uterus: Secondary | ICD-10-CM | POA: Diagnosis not present

## 2020-01-30 ENCOUNTER — Telehealth: Payer: Self-pay | Admitting: Family Medicine

## 2020-01-30 ENCOUNTER — Other Ambulatory Visit: Payer: Self-pay

## 2020-01-30 DIAGNOSIS — N3000 Acute cystitis without hematuria: Secondary | ICD-10-CM

## 2020-01-30 MED ORDER — CEPHALEXIN 500 MG PO CAPS: 500.0000 mg | ORAL_CAPSULE | Freq: Three times a day (TID) | ORAL | 0 refills | Status: DC

## 2020-01-30 NOTE — Telephone Encounter (Signed)
Sent in keflex to [facility]

## 2020-01-30 NOTE — Progress Notes (Unsigned)
Sent in Keflex for UTI noted by Dr Sherryll Burger

## 2020-01-30 NOTE — Telephone Encounter (Signed)
Copied from CRM [id number]. Topic: General - Other >> Jan 30, 2020  4:05 PM Herby Abraham C wrote: Reason for CRM: pt's daughter called in to follow up. She says that pt was seen by urologist and Dx pt with UTI. Daughter would like to know if PCP would send in Rx ? She states that pt was told to contact PCP. Urologist, Dr. Miquel Dunn office is faxing over results per daughter.   Pharmacy: [facility] - [place], [place] - [address]  Phone:  [phone] Fax:  [phone]

## 2020-02-11 ENCOUNTER — Ambulatory Visit: Payer: Medicare Other

## 2020-02-14 ENCOUNTER — Emergency Department: Payer: Medicare Other

## 2020-02-14 ENCOUNTER — Other Ambulatory Visit: Payer: Self-pay

## 2020-02-14 ENCOUNTER — Emergency Department
Admission: EM | Admit: 2020-02-14 | Discharge: 2020-02-14 | Disposition: A | Discharge status: Home / Self Care | Payer: Medicare Other | Attending: Emergency Medicine | Admitting: Emergency Medicine

## 2020-02-14 ENCOUNTER — Ambulatory Visit
Admission: RE | Admit: 2020-02-14 | Discharge: 2020-02-14 | Disposition: A | Discharge status: Home / Self Care | Payer: Medicare Other | Source: Ambulatory Visit | Attending: Neurology | Admitting: Neurology

## 2020-02-14 DIAGNOSIS — I6200 Nontraumatic subdural hemorrhage, unspecified: Secondary | ICD-10-CM | POA: Diagnosis not present

## 2020-02-14 DIAGNOSIS — R9389 Abnormal findings on diagnostic imaging of other specified body structures: Secondary | ICD-10-CM | POA: Diagnosis present

## 2020-02-14 DIAGNOSIS — G319 Degenerative disease of nervous system, unspecified: Secondary | ICD-10-CM | POA: Diagnosis not present

## 2020-02-14 DIAGNOSIS — W19XXXA Unspecified fall, initial encounter: Secondary | ICD-10-CM | POA: Insufficient documentation

## 2020-02-14 DIAGNOSIS — Y9289 Other specified places as the place of occurrence of the external cause: Secondary | ICD-10-CM | POA: Insufficient documentation

## 2020-02-14 DIAGNOSIS — Y999 Unspecified external cause status: Secondary | ICD-10-CM | POA: Insufficient documentation

## 2020-02-14 DIAGNOSIS — S065XAA Traumatic subdural hemorrhage with loss of consciousness status unknown, initial encounter: Secondary | ICD-10-CM

## 2020-02-14 DIAGNOSIS — I129 Hypertensive chronic kidney disease with stage 1 through stage 4 chronic kidney disease, or unspecified chronic kidney disease: Secondary | ICD-10-CM | POA: Insufficient documentation

## 2020-02-14 DIAGNOSIS — Z79899 Other long term (current) drug therapy: Secondary | ICD-10-CM | POA: Insufficient documentation

## 2020-02-14 DIAGNOSIS — N183 Chronic kidney disease, stage 3 unspecified: Secondary | ICD-10-CM | POA: Diagnosis not present

## 2020-02-14 DIAGNOSIS — Y9389 Activity, other specified: Secondary | ICD-10-CM | POA: Diagnosis not present

## 2020-02-14 DIAGNOSIS — R413 Other amnesia: Secondary | ICD-10-CM

## 2020-02-14 DIAGNOSIS — S065X0A Traumatic subdural hemorrhage without loss of consciousness, initial encounter: Secondary | ICD-10-CM | POA: Insufficient documentation

## 2020-02-14 DIAGNOSIS — S065X9A Traumatic subdural hemorrhage with loss of consciousness of unspecified duration, initial encounter: Secondary | ICD-10-CM

## 2020-02-14 DIAGNOSIS — G9389 Other specified disorders of brain: Secondary | ICD-10-CM | POA: Diagnosis not present

## 2020-02-14 LAB — CBC
HCT: 36.2 % (ref 36.0–46.0)
Hemoglobin: 12.5 g/dL (ref 12.0–15.0)
MCH: 33 pg (ref 26.0–34.0)
MCHC: 34.5 g/dL (ref 30.0–36.0)
MCV: 95.5 fL (ref 80.0–100.0)
Platelets: 117 10*3/uL — ABNORMAL LOW (ref 150–400)
RBC: 3.79 MIL/uL — ABNORMAL LOW (ref 3.87–5.11)
RDW: 12.1 % (ref 11.5–15.5)
WBC: 5.7 10*3/uL (ref 4.0–10.5)
nRBC: 0 % (ref 0.0–0.2)

## 2020-02-14 LAB — BASIC METABOLIC PANEL
Anion gap: 7 (ref 5–15)
BUN: 32 mg/dL — ABNORMAL HIGH (ref 8–23)
CO2: 24 mmol/L (ref 22–32)
Calcium: 10.3 mg/dL (ref 8.9–10.3)
Chloride: 109 mmol/L (ref 98–111)
Creatinine, Ser: 1.11 mg/dL — ABNORMAL HIGH (ref 0.44–1.00)
GFR calc Af Amer: 52 mL/min — ABNORMAL LOW (ref >60)
GFR calc non Af Amer: 45 mL/min — ABNORMAL LOW (ref >60)
Glucose, Bld: 105 mg/dL — ABNORMAL HIGH (ref 70–99)
Potassium: 4.2 mmol/L (ref 3.5–5.1)
Sodium: 140 mmol/L (ref 135–145)

## 2020-02-14 NOTE — Discharge Instructions (Addendum)
Please return for any worsening.  Otherwise please follow-up with Dr. Gearlean Alf, [facility].  Phone number to call for appointment is [phone].  Give them a call in the morning.  Let them know that you were in the ER and that the ER doctor had spoken with Dr. Gearlean Alf and you were supposed to follow-up with him.

## 2020-02-14 NOTE — ED Triage Notes (Addendum)
Pt to ED from outpatient MRI which was read as abnormal. Pt poor historian as to why she had MRI. Declines any pain, injury.   Daughter, [name] reports that pt has had multiple falls recently and was referred for MRI due to memory impairments since falls, last major fall was May of this year.

## 2020-02-14 NOTE — ED Provider Notes (Signed)
[facility] Emergency Department Provider Note   ____________________________________________   None    (approximate)  I have reviewed the triage vital signs and the nursing notes.   HISTORY  Chief Complaint Abnormal MRI    HPI Karen Hooper is a 86 y.o. female who comes here with family after having had an abnormal MRI.  The MRI shows a mixed attenuation subdural hematoma with 3 mm of midline shift.  This was done because of memory problems the patient was having.  Patient is here with her family member.  Family member reports patient has had a number of falls over the years because of a bad left knee.  Patient had a big fall in January and several more falls since then.  Memory is been getting worse.  She has no other problems neurologically per her report.  She does have what sounds like some sundowning when it gets dark.  Patient's not having any hit headache or vomiting.         Past Medical History:  Diagnosis Date  . Allergy   . Anxiety   . Hyperlipidemia   . Hypertension     Patient Active Problem List   Diagnosis Date Noted  . Epigastric abdominal pain   . Acute peptic ulcer of stomach   . Postpyloric ulcer   . Abnormal CT scan, small bowel   . CKD (chronic kidney disease) stage 3, GFR 30-59 ml/min 06/25/2015  . 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 Allergic rhinitis 11/13/2014  . Anxiety 11/13/2014  . Essential hypertension 11/13/2014  . Routine general medical examination at a health care facility 11/13/2014  . Hot flash, menopausal 11/13/2014  . Screening for depression 11/13/2014    Past Surgical History:  Procedure Laterality Date  . CATARACT EXTRACTION, BILATERAL    . ESOPHAGOGASTRODUODENOSCOPY (EGD) WITH PROPOFOL N/A 09/07/2018   Procedure: ESOPHAGOGASTRODUODENOSCOPY (EGD) WITH PROPOFOL;  Surgeon: Pasty Spillers, MD;  Location: [facility] ENDOSCOPY;  Service: Endoscopy;  Laterality: N/A;  .  EYE SURGERY    . VAGINAL HYSTERECTOMY      Prior to Admission medications   Medication Sig Start Date End Date Taking? Authorizing Provider  calcium carbonate (OS-CAL - DOSED IN MG OF ELEMENTAL CALCIUM) 1250 (500 Ca) MG tablet Take 1 tablet by mouth.    [provider]  cephALEXin (KEFLEX) 500 MG capsule Take 1 capsule (500 mg total) by mouth 3 (three) times daily. 01/30/20   Duanne Limerick, MD  lisinopril (ZESTRIL) 10 MG tablet Take 1 tablet (10 mg total) by mouth daily. 01/02/20   Duanne Limerick, MD  metoprolol succinate (TOPROL-XL) 50 MG 24 hr tablet TAKE (1) TABLET BY MOUTH EVERY DAY 01/02/20   Duanne Limerick, MD  mirtazapine (REMERON) 15 MG tablet Take 0.5 tablets (7.5 mg total) by mouth at bedtime. 01/02/20   Duanne Limerick, MD  Omega 3 1000 MG CAPS Take 2 capsules (2,000 mg total) by mouth 2 (two) times daily. 01/02/20   Duanne Limerick, MD  polyethylene glycol [facility]) packet Take 17 g by mouth daily. 08/27/18   Jeanmarie Plant, MD    Allergies Atorvastatin, Etodolac, and Ezetimibe  Family History  Problem Relation Age of Onset  . Breast cancer Sister 29  . Breast cancer Daughter 2    Social History Social History   Tobacco Use  . Smoking status: Never Smoker  . Smokeless tobacco: Never Used  Vaping Use  . Vaping Use: Never used  Substance  Use Topics  . Alcohol use: No    Alcohol/week: 0.0 standard drinks  . Drug use: Never    Review of Systems  Constitutional: No fever/chills Eyes: No visual changes. ENT: No sore throat. Cardiovascular: Denies chest pain. Respiratory: Denies shortness of breath. Gastrointestinal: No abdominal pain.  No nausea, no vomiting.  No diarrhea.  No constipation. Genitourinary: Negative for dysuria. Musculoskeletal: Negative for back pain. Skin: Negative for rash. Neurological: Negative for headaches, focal weakness except for the old weakness in the left knee ____________________________________________   PHYSICAL  EXAM:  VITAL SIGNS: ED Triage Vitals [02/14/20 1352]  Enc Vitals Group     BP (!) 146/60     Pulse Rate (!) 59     Resp 18     Temp 98.2 F (36.8 C)     Temp Source Oral     SpO2 99 %     Weight 150 lb (68 kg)     Height 5\' 4"  (1.626 m)     Head Circumference      Peak Flow      Pain Score 0     Pain Loc      Pain Edu?      Excl. in GC?     Constitutional: Alert and oriented to person and emergency room. Well appearing and in no acute distress. Eyes: Conjunctivae are normal. PER. EOMI. Head: Atraumatic. Nose: No congestion/rhinnorhea. Mouth/Throat: Mucous membranes are moist.   Neck: No stridor.   Cardiovascular: peripheral circulation. Respiratory: Normal respiratory effort.  No retractions.  Gastrointestinal:No distention.  Musculoskeletal: No lower extremity tenderness except from palpation to left knee which is chronic nor edema.   Neurologic:  Normal speech and language. No gross focal neurologic deficits are appreciated.  Cranial nerves II through XII are intact although visual fields were not checked cerebellar finger-to-nose is normal motor strength is 5/5 throughout patient does not report any numbness Skin:  Skin is warm, dry and intact. No rash noted.   ____________________________________________   LABS (all labs ordered are listed, but only abnormal results are displayed)  Labs Reviewed  CBC - Abnormal; Notable for the following components:      Result Value   RBC 3.79 (*)    Platelets 117 (*)    All other components within normal limits  BASIC METABOLIC PANEL - Abnormal; Notable for the following components:   Glucose, Bld 105 (*)    BUN 32 (*)    Creatinine, Ser 1.11 (*)    GFR calc non Af Amer 45 (*)    GFR calc Af Amer 52 (*)    All other components within normal limits   ____________________________________________  EKG   ____________________________________________  RADIOLOGY  ED MD interpretation MRI and CT scan show a mixed  attenuation subdural with 3 mm midline shift.  Mixed attenuation suggest that there may be some acute bleeding but most of the subdural appears to be subacute.  Official radiology report(s): CT Head Wo Contrast  Result Date: 02/14/2020 CLINICAL DATA:  6 hour follow-up scan. Subdural hematoma demonstrated on prior imaging. EXAM: CT HEAD WITHOUT CONTRAST TECHNIQUE: Contiguous axial images were obtained from the base of the skull through the vertex without intravenous contrast. COMPARISON:  CT 02/14/2020.  MRI 02/14/2020 FINDINGS: Brain: A mixed density right anterior frontal subdural hematoma is again demonstrated. Maximal depth is about 1.7 cm. Size and appearance are similar to the previous study. No definite progression. There is associated mass effect with effacement of the  underlying sulci and lateral ventricle. Mild right to left midline shift of about 2 mm. Basal cisterns are not effaced. Gray-white matter junctions are distinct. Underlying diffuse cerebral atrophy. Ventricular dilatation likely due to central atrophy. Low-attenuation changes in the deep white matter consistent with small vessel ischemic change. Vascular: Intracranial arterial calcifications. Skull: The calvarium appears intact. Sinuses/Orbits: No acute finding. Other: None. IMPRESSION: 1. Stable mixed density right anterior frontal subdural hematoma with associated mass effect and 2 mm right to left midline shift. 2. Chronic atrophy and small vessel ischemic changes. Electronically Signed   By: Burman Nieves M.D.   On: 02/14/2020 20:33   CT Head Wo Contrast  Result Date: 02/14/2020 CLINICAL DATA:  Follow-up right subdural hematoma EXAM: CT HEAD WITHOUT CONTRAST TECHNIQUE: Contiguous axial images were obtained from the base of the skull through the vertex without intravenous contrast. COMPARISON:  MRI from earlier in the same day. FINDINGS: Brain: Mixed attenuation right subdural hematoma is again identified measuring 19 mm in  thickness similar to that seen on prior MRI examination. The mixed attenuation suggests some recent hemorrhage although the overall appearance is subacute. Atrophic changes are noted. Very mild midline shift from right to left is noted this is also stable from the prior CT examination. No subarachnoid hemorrhage is noted. No mass lesion is seen. Vascular: No hyperdense vessel or unexpected calcification. Skull: Normal. Negative for fracture or focal lesion. Sinuses/Orbits: No acute finding. Other: None. IMPRESSION: Mixed attenuation subdural hematoma consistent with a subacute nature. Mild midline shift is noted similar to that seen on recent MRI. Mild atrophic changes are seen. Electronically Signed   By: Alcide Clever M.D.   On: 02/14/2020 14:21   MR BRAIN WO CONTRAST  Addendum Date: 02/14/2020   ADDENDUM REPORT: 02/14/2020 13:25 ADDENDUM: These results were called by telephone at the time of interpretation on 02/14/2020 at 1:21 pm to provider PA Stepp, who verbally acknowledged these results. The patient is being transported to the emergency department at this time for further evaluation. The [facility] emergency department was contacted and made aware of the patient's impending arrival. Electronically Signed   By: Jackey Loge DO   On: 02/14/2020 13:25   Result Date: 02/14/2020 CLINICAL DATA:  Memory loss for 3 years, progressively worsening fall in January 2020. EXAM: MRI HEAD WITHOUT CONTRAST TECHNIQUE: Multiplanar, multiecho pulse sequences of the brain and surrounding structures were obtained without intravenous contrast. COMPARISON:  No pertinent prior exams are available for comparison. FINDINGS: Brain: Intermittently motion degraded examination. Most notably, there is mild/moderate motion degradation of the axial T2/FLAIR sequence. Moderate generalized parenchymal atrophy of the brain. There is a T2/FLAIR hyperintense and T1 intermediate to high signal subdural hematoma overlying  the right frontal lobe which measures up to 2.1 cm in thickness. There is suggestion of internal septations within the hematoma. Additionally, there is SWI signal loss at this site. There is mass effect upon the underlying right frontal lobe. 3 mm leftward midline shift at the level of the septum pellucidum. Additionally, there is subtle FLAIR hyperintensity within the right frontal lobe sulci, deep to the subdural hematoma, which may reflect a small amount of subarachnoid hemorrhage (series 9, image 35). Minimal scattered T2/FLAIR hyperintensity within the cerebral white matter is nonspecific, but consistent with chronic small vessel ischemic disease. There is no acute infarct. No evidence of intracranial mass. Vascular: Expected proximal arterial flow voids. Skull and upper cervical spine: No focal marrow lesion. Sinuses/Orbits: Visualized orbits show no acute finding.  Mild ethmoid sinus mucosal thickening. Right middle ear/mastoid effusion. A small left mastoid effusion is also present. IMPRESSION: Motion degraded examination as described. Subdural hematoma overlying the right frontal lobe as described and measuring up to 2.1 cm in thickness. At least some of the blood products within the hematoma are subacute. However, non-contrast head CT is recommended to better assess for acute components. Associated mass effect upon the right frontal lobe with 3 mm leftward midline shift. Subtle FLAIR hyperintensity within right frontal lobe sulci, deep to the subdural hematoma, suspicious for subarachnoid hemorrhage. Moderate generalized parenchymal atrophy with mild chronic small vessel ischemic disease. Mild ethmoid sinus mucosal thickening. Right middle ear/mastoid effusion. A small left mastoid effusion is also present. Electronically Signed: By: Jackey LogeKyle  Golden DO On: 02/14/2020 13:10    ____________________________________________   PROCEDURES  Procedure(s) performed (including Critical  Care):  Procedures   ____________________________________________   INITIAL IMPRESSION / ASSESSMENT AND PLAN / ED COURSE  [facility].  He has looked at the MRI and CT.  He feels if the repeat CT in 6 hours does not show any change she can go and follow-up in clinic.  If the CT looks worse she will have to go there to [facility] that is tonight.  The number to call for appointment is [phone].    ----------------------------------------- 8:38 PM on 02/14/2020 -----------------------------------------  Repeat CT scan shows no change from prior.  We will discharge the patient and [facility]          ____________________________________________   FINAL CLINICAL IMPRESSION(S) / ED DIAGNOSES  Final diagnoses:  Subdural hematoma [facility](H[facility])     ED Discharge Orders    None       Note:  This document was prepared using Dragon voice recognition software and may include unintentional dictation errors.    Arnaldo NatalMalinda, Xyon Lukasik F, MD 02/14/20 2040

## 2020-02-14 NOTE — ED Notes (Signed)
Pt is sittingin waiting area with daughter denies any pain.

## 2020-02-18 DIAGNOSIS — G301 Alzheimer's disease with late onset: Secondary | ICD-10-CM | POA: Diagnosis not present

## 2020-02-18 DIAGNOSIS — F5104 Psychophysiologic insomnia: Secondary | ICD-10-CM | POA: Diagnosis not present

## 2020-02-18 DIAGNOSIS — F0280 Dementia in other diseases classified elsewhere without behavioral disturbance: Secondary | ICD-10-CM | POA: Diagnosis not present

## 2020-03-12 DIAGNOSIS — M1712 Unilateral primary osteoarthritis, left knee: Secondary | ICD-10-CM | POA: Diagnosis not present

## 2020-03-12 DIAGNOSIS — S065X9A Traumatic subdural hemorrhage with loss of consciousness of unspecified duration, initial encounter: Secondary | ICD-10-CM | POA: Diagnosis not present

## 2020-03-12 DIAGNOSIS — W1839XA Other fall on same level, initial encounter: Secondary | ICD-10-CM | POA: Diagnosis not present

## 2020-04-02 ENCOUNTER — Encounter: Payer: Self-pay | Admitting: Family Medicine

## 2020-04-02 ENCOUNTER — Other Ambulatory Visit: Payer: Self-pay

## 2020-04-02 ENCOUNTER — Ambulatory Visit: Payer: Medicare Other | Admitting: Family Medicine

## 2020-04-02 VITALS — BP 130/64 | HR 76 | Ht 64.0 in | Wt 152.0 lb

## 2020-04-02 DIAGNOSIS — S81802A Unspecified open wound, left lower leg, initial encounter: Secondary | ICD-10-CM | POA: Diagnosis not present

## 2020-04-02 DIAGNOSIS — L03116 Cellulitis of left lower limb: Secondary | ICD-10-CM

## 2020-04-02 DIAGNOSIS — Z23 Encounter for immunization: Secondary | ICD-10-CM

## 2020-04-02 MED ORDER — CEPHALEXIN 500 MG PO CAPS: 500.0000 mg | ORAL_CAPSULE | Freq: Three times a day (TID) | ORAL | 0 refills | Status: DC

## 2020-04-02 MED ORDER — MUPIROCIN 2 % EX OINT: 1.0000 "application " | TOPICAL_OINTMENT | Freq: Two times a day (BID) | CUTANEOUS | 0 refills | Status: DC

## 2020-04-02 NOTE — Progress Notes (Signed)
Date:  04/02/2020   Name:  Karen Hooper   DOB:  1932/01/05   MRN:  [id number]   Chief Complaint: Abrasion (L) leg- hit on car door 1-2 weeks. Red and swollen, sore) and Flu Vaccine  Patient is a 86 year old female who presents for a avulsion/wound exam. The patient reports the following problems: avulsion. Health maintenance has been reviewed    Lab Results  Component Value Date   CREATININE 1.11 (H) 02/14/2020   BUN 32 (H) 02/14/2020   NA 140 02/14/2020   K 4.2 02/14/2020   CL 109 02/14/2020   CO2 24 02/14/2020   Lab Results  Component Value Date   CHOL 180 07/03/2019   HDL 65 07/03/2019   LDLCALC 95 07/03/2019   TRIG 115 07/03/2019   CHOLHDL 3.0 12/26/2017   No results found for: TSH No results found for: HGBA1C Lab Results  Component Value Date   WBC 5.7 02/14/2020   HGB 12.5 02/14/2020   HCT 36.2 02/14/2020   MCV 95.5 02/14/2020   PLT 117 (L) 02/14/2020   Lab Results  Component Value Date   ALT 15 07/03/2019   AST 29 07/03/2019   ALKPHOS 83 07/03/2019   BILITOT 0.5 07/03/2019     Review of Systems  Constitutional: Negative.  Negative for chills, fatigue, fever and unexpected weight change.  HENT: Negative for congestion, ear discharge, ear pain, rhinorrhea, sinus pressure, sneezing and sore throat.   Eyes: Negative for photophobia, pain, discharge, redness and itching.  Respiratory: Negative for cough, shortness of breath, wheezing and stridor.   Gastrointestinal: Negative for abdominal pain, blood in stool, constipation, diarrhea, nausea and vomiting.  Endocrine: Negative for cold intolerance, heat intolerance, polydipsia, polyphagia and polyuria.  Genitourinary: Negative for dysuria, flank pain, frequency, hematuria, menstrual problem, pelvic pain, urgency, vaginal bleeding and vaginal discharge.  Musculoskeletal: Negative for arthralgias, back pain and myalgias.  Skin: Negative for rash.  Allergic/Immunologic: Negative for environmental allergies  and food allergies.  Neurological: Negative for dizziness, weakness, light-headedness, numbness and headaches.  Hematological: Negative for adenopathy. Does not bruise/bleed easily.  Psychiatric/Behavioral: Negative for dysphoric mood. The patient is not nervous/anxious.     Patient Active Problem List   Diagnosis Date Noted   Epigastric abdominal pain    Acute peptic ulcer of stomach    Postpyloric ulcer    Abnormal CT scan, small bowel    CKD (chronic kidney disease) stage 3, GFR 30-59 ml/min 06/25/2015   Familial multiple lipoprotein-type hyperlipidemia 11/13/2014   Allergic rhinitis 11/13/2014   Anxiety 11/13/2014   Essential hypertension 11/13/2014   Routine general medical examination at a health care facility 11/13/2014   Hot flash, menopausal 11/13/2014   Screening for depression 11/13/2014    Allergies  Allergen Reactions   Atorvastatin    Etodolac    Ezetimibe     Past Surgical History:  Procedure Laterality Date   CATARACT EXTRACTION, BILATERAL     ESOPHAGOGASTRODUODENOSCOPY (EGD) WITH PROPOFOL N/A 09/07/2018   Procedure: ESOPHAGOGASTRODUODENOSCOPY (EGD) WITH PROPOFOL;  Surgeon: Pasty Spillers, MD;  Location: [facility] ENDOSCOPY;  Service: Endoscopy;  Laterality: N/A;   EYE SURGERY     VAGINAL HYSTERECTOMY      Social History   Tobacco Use   Smoking status: Never Smoker   Smokeless tobacco: Never Used  Vaping Use   Vaping Use: Never used  Substance Use Topics   Alcohol use: No    Alcohol/week: 0.0 standard drinks   Drug use: Never  Medication list has been reviewed and updated.  Current Meds  Medication Sig   calcium carbonate (OS-CAL - DOSED IN MG OF ELEMENTAL CALCIUM) 1250 (500 Ca) MG tablet Take 1 tablet by mouth.   lisinopril (ZESTRIL) 10 MG tablet Take 1 tablet (10 mg total) by mouth daily.   metoprolol succinate (TOPROL-XL) 50 MG 24 hr tablet TAKE (1) TABLET BY MOUTH EVERY DAY   mirtazapine (REMERON) 15 MG  tablet Take 0.5 tablets (7.5 mg total) by mouth at bedtime. (Patient taking differently: Take 15 mg by mouth at bedtime. )   Omega 3 1000 MG CAPS Take 2 capsules (2,000 mg total) by mouth 2 (two) times daily.    PHQ 2/9 Scores 04/02/2020 01/02/2020 07/31/2019 07/03/2019  PHQ - 2 Score 0 0 0 0  PHQ- 9 Score 0 0 - 0    GAD 7 : Generalized Anxiety Score 04/02/2020 01/02/2020 07/03/2019  Nervous, Anxious, on Edge 0 0 0  Control/stop worrying 0 0 0  Worry too much - different things 0 0 0  Trouble relaxing 0 0 0  Restless 0 0 0  Easily annoyed or irritable 0 0 0  Afraid - awful might happen 0 0 0  Total GAD 7 Score 0 0 0  Anxiety Difficulty - Not difficult at all -    BP Readings from Last 3 Encounters:  04/02/20 130/64  02/14/20 96/63  01/02/20 130/80    Physical Exam Vitals and nursing note reviewed.  Skin:    Findings: Erythema, signs of injury and wound present.     Comments: Lateral aspect of left leg a 2 x 2 x 2 cm triangular avulsion wound.  Devitalized tissue was removed and underlying viral tissue was gently cleaned with antibacterial solution.  This was rinsed.  Then pat dry.  Telfa pad was applied.  Roll gauze was applied.  And netting was applied.     Wt Readings from Last 3 Encounters:  04/02/20 152 lb (68.9 kg)  02/14/20 150 lb (68 kg)  01/02/20 156 lb (70.8 kg)    BP 130/64    Pulse 76    Ht 5\' 4"  (1.626 m)    Wt 152 lb (68.9 kg)    BMI 26.09 kg/m   Assessment and Plan: 1. Wound of left leg, initial encounter New onset.  Persistent for 2 weeks.  Wound is noted to have devitalized tissue overlying with surrounding erythema.  We will treat with Keflex 500 mg 3 times a day for 10 days and Bactroban apply twice a day.  Patient has been treated with removal of the devitalized tissue and dressed with triple antibiotic ointment and Telfa pad.  This is been shown step-by-step to the family and they will continue to do so for twice a day and we will recheck in 1 week. -  cephALEXin (KEFLEX) 500 MG capsule; Take 1 capsule (500 mg total) by mouth 3 (three) times daily.  Dispense: 30 capsule; Refill: 0 - mupirocin ointment (BACTROBAN) 2 %; Apply 1 application topically 2 (two) times daily.  Dispense: 22 g; Refill: 0  2. Need for immunization against influenza Discussed and administered. - Flu Vaccine QUAD High Dose(Fluad)  3. Cellulitis of left lower extremity Patient has an area of cellulitis surrounding the wound we will initiate cephalexin 500 mg 3 times a day for 10 days. - cephALEXin (KEFLEX) 500 MG capsule; Take 1 capsule (500 mg total) by mouth 3 (three) times daily.  Dispense: 30 capsule; Refill: 0

## 2020-04-09 ENCOUNTER — Encounter: Payer: Self-pay | Admitting: Family Medicine

## 2020-04-09 ENCOUNTER — Other Ambulatory Visit: Payer: Self-pay

## 2020-04-09 ENCOUNTER — Ambulatory Visit (INDEPENDENT_AMBULATORY_CARE_PROVIDER_SITE_OTHER): Payer: Medicare Other | Admitting: Family Medicine

## 2020-04-09 VITALS — BP 120/60 | HR 72 | Ht 64.0 in | Wt 154.0 lb

## 2020-04-09 DIAGNOSIS — S81802D Unspecified open wound, left lower leg, subsequent encounter: Secondary | ICD-10-CM

## 2020-04-09 NOTE — Progress Notes (Signed)
Date:  04/09/2020   Name:  Karen Hooper   DOB:  Jan 05, 1932   MRN:  [id number]   Chief Complaint: Follow-up (recheck wound)  Patient is a 85 year old female who presents for a wound care exam. The patient reports the following problems: none. Health maintenance has been reviewed up to date.   Lab Results  Component Value Date   CREATININE 1.11 (H) 02/14/2020   BUN 32 (H) 02/14/2020   NA 140 02/14/2020   K 4.2 02/14/2020   CL 109 02/14/2020   CO2 24 02/14/2020   Lab Results  Component Value Date   CHOL 180 07/03/2019   HDL 65 07/03/2019   LDLCALC 95 07/03/2019   TRIG 115 07/03/2019   CHOLHDL 3.0 12/26/2017   No results found for: TSH No results found for: HGBA1C Lab Results  Component Value Date   WBC 5.7 02/14/2020   HGB 12.5 02/14/2020   HCT 36.2 02/14/2020   MCV 95.5 02/14/2020   PLT 117 (L) 02/14/2020   Lab Results  Component Value Date   ALT 15 07/03/2019   AST 29 07/03/2019   ALKPHOS 83 07/03/2019   BILITOT 0.5 07/03/2019     Review of Systems  Constitutional: Negative.  Negative for chills, fatigue, fever and unexpected weight change.  HENT: Negative for congestion, ear discharge, ear pain, rhinorrhea, sinus pressure, sneezing and sore throat.   Eyes: Negative for photophobia, pain, discharge, redness and itching.  Respiratory: Negative for cough, shortness of breath, wheezing and stridor.   Cardiovascular: Negative for chest pain, palpitations and leg swelling.  Gastrointestinal: Negative for abdominal pain, blood in stool, constipation, diarrhea, nausea and vomiting.  Endocrine: Negative for cold intolerance, heat intolerance, polydipsia, polyphagia and polyuria.  Genitourinary: Negative for dysuria, flank pain, frequency, hematuria, menstrual problem, pelvic pain, urgency, vaginal bleeding and vaginal discharge.  Musculoskeletal: Negative for arthralgias, back pain and myalgias.  Skin: Negative for rash.  Allergic/Immunologic: Negative for  environmental allergies and food allergies.  Neurological: Negative for dizziness, weakness, light-headedness, numbness and headaches.  Hematological: Negative for adenopathy. Does not bruise/bleed easily.  Psychiatric/Behavioral: Negative for dysphoric mood. The patient is not nervous/anxious.     Patient Active Problem List   Diagnosis Date Noted  . Epigastric abdominal pain   . Acute peptic ulcer of stomach   . Postpyloric ulcer   . Abnormal CT scan, small bowel   . CKD (chronic kidney disease) stage 3, GFR 30-59 ml/min 06/25/2015  . 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 Allergic rhinitis 11/13/2014  . Anxiety 11/13/2014  . Essential hypertension 11/13/2014  . Routine general medical examination at a health care facility 11/13/2014  . Hot flash, menopausal 11/13/2014  . Screening for depression 11/13/2014    Allergies  Allergen Reactions  . Atorvastatin   . Etodolac   . Ezetimibe     Past Surgical History:  Procedure Laterality Date  . CATARACT EXTRACTION, BILATERAL    . ESOPHAGOGASTRODUODENOSCOPY (EGD) WITH PROPOFOL N/A 09/07/2018   Procedure: ESOPHAGOGASTRODUODENOSCOPY (EGD) WITH PROPOFOL;  Surgeon: Pasty Spillers, MD;  Location: [facility];  Service: Endoscopy;  Laterality: N/A;  . EYE SURGERY    . VAGINAL HYSTERECTOMY      Social History   Tobacco Use  . Smoking status: Never Smoker  . Smokeless tobacco: Never Used  Vaping Use  . Vaping Use: Never used  Substance Use Topics  . Alcohol use: No    Alcohol/week: 0.0 standard drinks  . Drug use: Never  Medication list has been reviewed and updated.  Current Meds  Medication Sig  . calcium carbonate (OS-CAL - DOSED IN MG OF ELEMENTAL CALCIUM) 1250 (500 Ca) MG tablet Take 1 tablet by mouth.  . cephALEXin (KEFLEX) 500 MG capsule Take 1 capsule (500 mg total) by mouth 3 (three) times daily.  Marland Kitchen lisinopril (ZESTRIL) 10 MG tablet Take 1 tablet (10 mg total) by mouth daily.    . metoprolol succinate (TOPROL-XL) 50 MG 24 hr tablet TAKE (1) TABLET BY MOUTH EVERY DAY  . mirtazapine (REMERON) 15 MG tablet Take 0.5 tablets (7.5 mg total) by mouth at bedtime. (Patient taking differently: Take 15 mg by mouth at bedtime. )  . mupirocin ointment (BACTROBAN) 2 % Apply 1 application topically 2 (two) times daily.  . Omega 3 1000 MG CAPS Take 2 capsules (2,000 mg total) by mouth 2 (two) times daily.  . polyethylene glycol (MIRALAX) packet Take 17 g by mouth daily.    PHQ 2/9 Scores 04/02/2020 01/02/2020 07/31/2019 07/03/2019  PHQ - 2 Score 0 0 0 0  PHQ- 9 Score 0 0 - 0    GAD 7 : Generalized Anxiety Score 04/02/2020 01/02/2020 07/03/2019  Nervous, Anxious, on Edge 0 0 0  Control/stop worrying 0 0 0  Worry too much - different things 0 0 0  Trouble relaxing 0 0 0  Restless 0 0 0  Easily annoyed or irritable 0 0 0  Afraid - awful might happen 0 0 0  Total GAD 7 Score 0 0 0  Anxiety Difficulty - Not difficult at all -    BP Readings from Last 3 Encounters:  04/09/20 120/60  04/02/20 130/64  02/14/20 96/63    Physical Exam Vitals and nursing note reviewed.  Constitutional:      Appearance: She is well-developed.  HENT:     Head: Normocephalic.     Right Ear: Tympanic membrane and external ear normal.     Left Ear: Tympanic membrane and external ear normal.     Nose: Nose normal.     Mouth/Throat:     Mouth: Mucous membranes are moist.  Eyes:     General: Lids are everted, no foreign bodies appreciated. No scleral icterus.       Left eye: No foreign body or hordeolum.     Conjunctiva/sclera: Conjunctivae normal.     Right eye: Right conjunctiva is not injected.     Left eye: Left conjunctiva is not injected.     Pupils: Pupils are equal, round, and reactive to light.  Neck:     Thyroid: No thyromegaly.     Vascular: No JVD.     Trachea: No tracheal deviation.  Cardiovascular:     Rate and Rhythm: Normal rate and regular rhythm.     Heart sounds: Normal  heart sounds. No murmur heard.  No friction rub. No gallop.   Pulmonary:     Effort: Pulmonary effort is normal. No respiratory distress.     Breath sounds: Normal breath sounds. No wheezing, rhonchi or rales.  Abdominal:     General: Bowel sounds are normal.     Palpations: Abdomen is soft. There is no mass.     Tenderness: There is no abdominal tenderness. There is no guarding or rebound.  Musculoskeletal:        General: No tenderness. Normal range of motion.     Cervical back: Normal range of motion and neck supple.  Lymphadenopathy:     Cervical: No cervical adenopathy.  Skin:    General: Skin is warm.     Findings: No rash.  Neurological:     Mental Status: She is alert and oriented to person, place, and time.     Cranial Nerves: No cranial nerve deficit.     Deep Tendon Reflexes: Reflexes normal.  Psychiatric:        Mood and Affect: Mood is not anxious or depressed.     Wt Readings from Last 3 Encounters:  04/09/20 154 lb (69.9 kg)  04/02/20 152 lb (68.9 kg)  02/14/20 150 lb (68 kg)    BP 120/60   Pulse 72   Ht 5\' 4"  (1.626 m)   Wt 154 lb (69.9 kg)   BMI 26.43 kg/m   Assessment and Plan:  1. Wound of left leg, subsequent encounter Wound of left leg on recheck is healing well with reepithelialization noted.  We will continue current regimen of wound care and will recheck in 1 week.

## 2020-04-16 ENCOUNTER — Ambulatory Visit: Payer: Medicare Other | Admitting: Family Medicine

## 2020-04-16 ENCOUNTER — Other Ambulatory Visit: Payer: Self-pay

## 2020-04-16 ENCOUNTER — Encounter: Payer: Self-pay | Admitting: Family Medicine

## 2020-04-16 VITALS — BP 124/70 | HR 64 | Ht 64.0 in | Wt 154.0 lb

## 2020-04-16 DIAGNOSIS — S81802D Unspecified open wound, left lower leg, subsequent encounter: Secondary | ICD-10-CM

## 2020-04-16 DIAGNOSIS — S81802A Unspecified open wound, left lower leg, initial encounter: Secondary | ICD-10-CM

## 2020-04-16 MED ORDER — MUPIROCIN 2 % EX OINT: 1.0000 "application " | TOPICAL_OINTMENT | Freq: Two times a day (BID) | CUTANEOUS | 0 refills | Status: DC

## 2020-04-16 NOTE — Progress Notes (Signed)
Date:  04/16/2020   Name:  Karen Hooper   DOB:  1932/04/17   MRN:  [id number]   Chief Complaint: Follow-up (wound on leg)  Patient is a 86 year old  who presents for a wound care  exam. The patient reports the following problems: currently. Health maintenance has been reviewed up to date.   Lab Results  Component Value Date   CREATININE 1.11 (H) 02/14/2020   BUN 32 (H) 02/14/2020   NA 140 02/14/2020   K 4.2 02/14/2020   CL 109 02/14/2020   CO2 24 02/14/2020   Lab Results  Component Value Date   CHOL 180 07/03/2019   HDL 65 07/03/2019   LDLCALC 95 07/03/2019   TRIG 115 07/03/2019   CHOLHDL 3.0 12/26/2017   No results found for: TSH No results found for: HGBA1C Lab Results  Component Value Date   WBC 5.7 02/14/2020   HGB 12.5 02/14/2020   HCT 36.2 02/14/2020   MCV 95.5 02/14/2020   PLT 117 (L) 02/14/2020   Lab Results  Component Value Date   ALT 15 07/03/2019   AST 29 07/03/2019   ALKPHOS 83 07/03/2019   BILITOT 0.5 07/03/2019     Review of Systems  Constitutional: Negative.  Negative for chills, fatigue, fever and unexpected weight change.  HENT: Negative for congestion, ear discharge, ear pain, rhinorrhea, sinus pressure, sneezing and sore throat.   Eyes: Negative for photophobia, pain, discharge, redness and itching.  Respiratory: Negative for cough, shortness of breath, wheezing and stridor.   Gastrointestinal: Negative for abdominal pain, blood in stool, constipation, diarrhea, nausea and vomiting.  Endocrine: Negative for cold intolerance, heat intolerance, polydipsia, polyphagia and polyuria.  Genitourinary: Negative for dysuria, flank pain, frequency, hematuria, menstrual problem, pelvic pain, urgency, vaginal bleeding and vaginal discharge.  Musculoskeletal: Negative for arthralgias, back pain and myalgias.  Skin: Negative for rash.  Allergic/Immunologic: Negative for environmental allergies and food allergies.  Neurological: Negative for  dizziness, weakness, light-headedness, numbness and headaches.  Hematological: Negative for adenopathy. Does not bruise/bleed easily.  Psychiatric/Behavioral: Negative for dysphoric mood. The patient is not nervous/anxious.     Patient Active Problem List   Diagnosis Date Noted  . Epigastric abdominal pain   . Acute peptic ulcer of stomach   . Postpyloric ulcer   . Abnormal CT scan, small bowel   . CKD (chronic kidney disease) stage 3, GFR 30-59 ml/min (HCC) 06/25/2015  . 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 Allergic rhinitis 11/13/2014  . Anxiety 11/13/2014  . Essential hypertension 11/13/2014  . Routine general medical examination at a health care facility 11/13/2014  . Hot flash, menopausal 11/13/2014  . Screening for depression 11/13/2014    Allergies  Allergen Reactions  . Atorvastatin   . Etodolac   . Ezetimibe     Past Surgical History:  Procedure Laterality Date  . CATARACT EXTRACTION, BILATERAL    . ESOPHAGOGASTRODUODENOSCOPY (EGD) WITH PROPOFOL N/A 09/07/2018   Procedure: ESOPHAGOGASTRODUODENOSCOPY (EGD) WITH PROPOFOL;  Surgeon: Pasty Spillers, MD;  Location: [facility];  Service: Endoscopy;  Laterality: N/A;  . EYE SURGERY    . VAGINAL HYSTERECTOMY      Social History   Tobacco Use  . Smoking status: Never Smoker  . Smokeless tobacco: Never Used  Vaping Use  . Vaping Use: Never used  Substance Use Topics  . Alcohol use: No    Alcohol/week: 0.0 standard drinks  . Drug use: Never     Medication list has been  reviewed and updated.  Current Meds  Medication Sig  . calcium carbonate (OS-CAL - DOSED IN MG OF ELEMENTAL CALCIUM) 1250 (500 Ca) MG tablet Take 1 tablet by mouth.  Marland Kitchen lisinopril (ZESTRIL) 10 MG tablet Take 1 tablet (10 mg total) by mouth daily.  . metoprolol succinate (TOPROL-XL) 50 MG 24 hr tablet TAKE (1) TABLET BY MOUTH EVERY DAY  . mirtazapine (REMERON) 15 MG tablet Take 0.5 tablets (7.5 mg total) by mouth  at bedtime. (Patient taking differently: Take 15 mg by mouth at bedtime. )  . mupirocin ointment (BACTROBAN) 2 % Apply 1 application topically 2 (two) times daily.  . Omega 3 1000 MG CAPS Take 2 capsules (2,000 mg total) by mouth 2 (two) times daily.  . polyethylene glycol (MIRALAX) packet Take 17 g by mouth daily.    PHQ 2/9 Scores 04/02/2020 01/02/2020 07/31/2019 07/03/2019  PHQ - 2 Score 0 0 0 0  PHQ- 9 Score 0 0 - 0    GAD 7 : Generalized Anxiety Score 04/02/2020 01/02/2020 07/03/2019  Nervous, Anxious, on Edge 0 0 0  Control/stop worrying 0 0 0  Worry too much - different things 0 0 0  Trouble relaxing 0 0 0  Restless 0 0 0  Easily annoyed or irritable 0 0 0  Afraid - awful might happen 0 0 0  Total GAD 7 Score 0 0 0  Anxiety Difficulty - Not difficult at all -    BP Readings from Last 3 Encounters:  04/16/20 124/70  04/09/20 120/60  04/02/20 130/64    Physical Exam  Wt Readings from Last 3 Encounters:  04/16/20 154 lb (69.9 kg)  04/09/20 154 lb (69.9 kg)  04/02/20 152 lb (68.9 kg)    BP 124/70   Pulse 64   Ht 5\' 4"  (1.626 m)   Wt 154 lb (69.9 kg)   BMI 26.43 kg/m   Assessment and Plan: 1. Wound of left leg, subsequent encounter Patient here for wound care check for involves the leg laceration several weeks ago.  Patient is doing well on the subsequent exam with a clean wound.  We will redress and we will continue with present regimen for an another 1 week and then will go to a Band-Aid over the area and will recheck patient in 2 to 3 weeks.

## 2020-04-16 NOTE — Addendum Note (Signed)
Addended by: Everitt Amber on: 04/16/2020 12:38 PM   Modules accepted: Orders

## 2020-04-17 DIAGNOSIS — T8522XD Displacement of intraocular lens, subsequent encounter: Secondary | ICD-10-CM | POA: Diagnosis not present

## 2020-05-07 ENCOUNTER — Encounter: Payer: Self-pay | Admitting: Family Medicine

## 2020-05-07 ENCOUNTER — Ambulatory Visit: Payer: Medicare Other | Admitting: Family Medicine

## 2020-05-07 ENCOUNTER — Other Ambulatory Visit: Payer: Self-pay

## 2020-05-07 VITALS — BP 130/80 | HR 68 | Ht 64.0 in | Wt 154.0 lb

## 2020-05-07 DIAGNOSIS — S81802D Unspecified open wound, left lower leg, subsequent encounter: Secondary | ICD-10-CM | POA: Diagnosis not present

## 2020-05-07 NOTE — Progress Notes (Signed)
Date:  05/07/2020   Name:  Karen Hooper   DOB:  1932/07/04   MRN:  [id number]   Chief Complaint: Follow-up (leg)  Patient is a 86 year old female who presents for a wound care exam. The patient reports the following problems: s/p avulsive laceration. Health maintenance has been reviewed up to date.   Lab Results  Component Value Date   CREATININE 1.11 (H) 02/14/2020   BUN 32 (H) 02/14/2020   NA 140 02/14/2020   K 4.2 02/14/2020   CL 109 02/14/2020   CO2 24 02/14/2020   Lab Results  Component Value Date   CHOL 180 07/03/2019   HDL 65 07/03/2019   LDLCALC 95 07/03/2019   TRIG 115 07/03/2019   CHOLHDL 3.0 12/26/2017   No results found for: TSH No results found for: HGBA1C Lab Results  Component Value Date   WBC 5.7 02/14/2020   HGB 12.5 02/14/2020   HCT 36.2 02/14/2020   MCV 95.5 02/14/2020   PLT 117 (L) 02/14/2020   Lab Results  Component Value Date   ALT 15 07/03/2019   AST 29 07/03/2019   ALKPHOS 83 07/03/2019   BILITOT 0.5 07/03/2019     Review of Systems  Constitutional: Negative.  Negative for chills, fatigue, fever and unexpected weight change.  HENT: Negative for congestion, ear discharge, ear pain, rhinorrhea, sinus pressure, sneezing and sore throat.   Eyes: Negative for photophobia, pain, discharge, redness and itching.  Respiratory: Negative for cough, shortness of breath, wheezing and stridor.   Gastrointestinal: Negative for abdominal pain, blood in stool, constipation, diarrhea, nausea and vomiting.  Endocrine: Negative for cold intolerance, heat intolerance, polydipsia, polyphagia and polyuria.  Genitourinary: Negative for dysuria, flank pain, frequency, hematuria, menstrual problem, pelvic pain, urgency, vaginal bleeding and vaginal discharge.  Musculoskeletal: Negative for arthralgias, back pain and myalgias.  Skin: Negative for rash.  Allergic/Immunologic: Negative for environmental allergies and food allergies.  Neurological: Negative  for dizziness, weakness, light-headedness, numbness and headaches.  Hematological: Negative for adenopathy. Does not bruise/bleed easily.  Psychiatric/Behavioral: Negative for dysphoric mood. The patient is not nervous/anxious.     Patient Active Problem List   Diagnosis Date Noted  . Epigastric abdominal pain   . Acute peptic ulcer of stomach   . Postpyloric ulcer   . Abnormal CT scan, small bowel   . CKD (chronic kidney disease) stage 3, GFR 30-59 ml/min (HCC) 06/25/2015  . 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 Allergic rhinitis 11/13/2014  . Anxiety 11/13/2014  . Essential hypertension 11/13/2014  . Routine general medical examination at a health care facility 11/13/2014  . Hot flash, menopausal 11/13/2014  . Screening for depression 11/13/2014    Allergies  Allergen Reactions  . Atorvastatin   . Etodolac   . Ezetimibe     Past Surgical History:  Procedure Laterality Date  . CATARACT EXTRACTION, BILATERAL    . ESOPHAGOGASTRODUODENOSCOPY (EGD) WITH PROPOFOL N/A 09/07/2018   Procedure: ESOPHAGOGASTRODUODENOSCOPY (EGD) WITH PROPOFOL;  Surgeon: Pasty Spillers, MD;  Location: [facility];  Service: Endoscopy;  Laterality: N/A;  . EYE SURGERY    . VAGINAL HYSTERECTOMY      Social History   Tobacco Use  . Smoking status: Never Smoker  . Smokeless tobacco: Never Used  Vaping Use  . Vaping Use: Never used  Substance Use Topics  . Alcohol use: No    Alcohol/week: 0.0 standard drinks  . Drug use: Never     Medication list has been reviewed  and updated.  Current Meds  Medication Sig  . calcium carbonate (OS-CAL - DOSED IN MG OF ELEMENTAL CALCIUM) 1250 (500 Ca) MG tablet Take 1 tablet by mouth.  Marland Kitchen lisinopril (ZESTRIL) 10 MG tablet Take 1 tablet (10 mg total) by mouth daily.  . metoprolol succinate (TOPROL-XL) 50 MG 24 hr tablet TAKE (1) TABLET BY MOUTH EVERY DAY  . mirtazapine (REMERON) 15 MG tablet Take 0.5 tablets (7.5 mg total) by  mouth at bedtime. (Patient taking differently: Take 15 mg by mouth at bedtime. )  . mupirocin ointment (BACTROBAN) 2 % Apply 1 application topically 2 (two) times daily.  . Omega 3 1000 MG CAPS Take 2 capsules (2,000 mg total) by mouth 2 (two) times daily.  . polyethylene glycol (MIRALAX) packet Take 17 g by mouth daily.    PHQ 2/9 Scores 04/02/2020 01/02/2020 07/31/2019 07/03/2019  PHQ - 2 Score 0 0 0 0  PHQ- 9 Score 0 0 - 0    GAD 7 : Generalized Anxiety Score 04/02/2020 01/02/2020 07/03/2019  Nervous, Anxious, on Edge 0 0 0  Control/stop worrying 0 0 0  Worry too much - different things 0 0 0  Trouble relaxing 0 0 0  Restless 0 0 0  Easily annoyed or irritable 0 0 0  Afraid - awful might happen 0 0 0  Total GAD 7 Score 0 0 0  Anxiety Difficulty - Not difficult at all -    BP Readings from Last 3 Encounters:  05/07/20 130/80  04/16/20 124/70  04/09/20 120/60    Physical Exam Vitals and nursing note reviewed.  Cardiovascular:     Rate and Rhythm: Normal rate.     Heart sounds: Normal heart sounds, S1 normal and S2 normal. No murmur heard.  No systolic murmur is present.  No diastolic murmur is present.  No gallop. No S3 or S4 sounds.   Musculoskeletal:     Right lower leg: No edema.     Left lower leg: No edema.     Wt Readings from Last 3 Encounters:  05/07/20 154 lb (69.9 kg)  04/16/20 154 lb (69.9 kg)  04/09/20 154 lb (69.9 kg)    BP 130/80   Pulse 68   Ht 5\' 4"  (1.626 m)   Wt 154 lb (69.9 kg)   BMI 26.43 kg/m   Assessment and Plan:  1. Wound of left leg, subsequent encounter Acute.  Subsequent visit.  Controlled healing.  Stable.  Patient is doing well with current regimen of wet-to-dry.  Patient will continue daily dressing changes and will not need to be seen for subsequent visits.

## 2020-05-16 IMAGING — CR DG CERVICAL SPINE COMPLETE 4+V
8 series · 8 of 8 positions shown · non-contrast
Comparison: None.

CLINICAL DATA: Bilateral neck pain following a fall 2 days ago.

EXAM:
CERVICAL SPINE - COMPLETE 4+ VIEW

[c-spine lat]
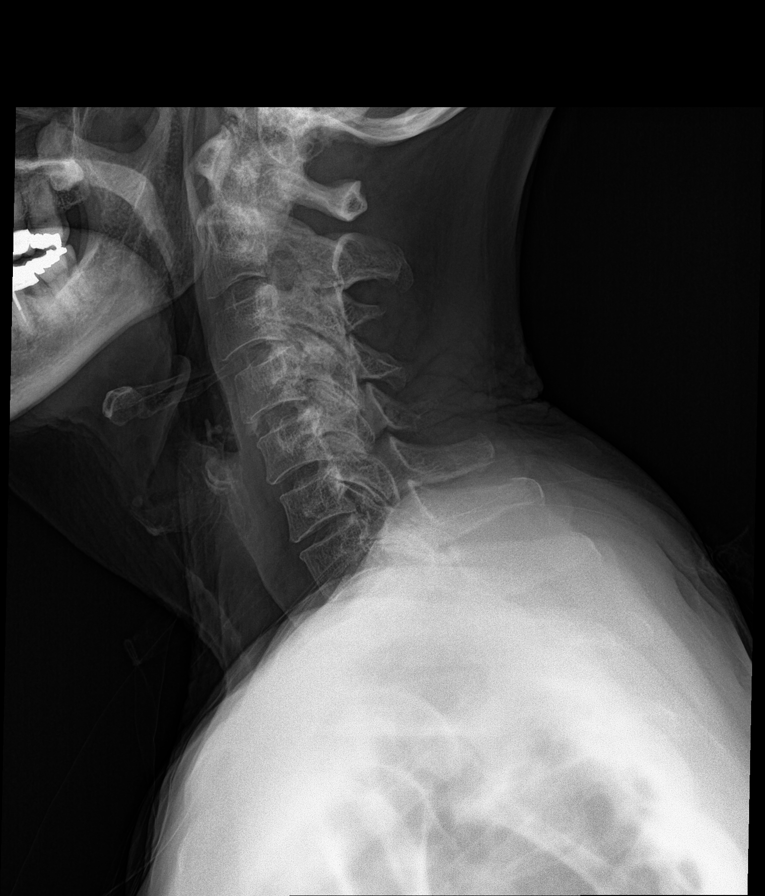

[c-spine obl (1 of 2)]
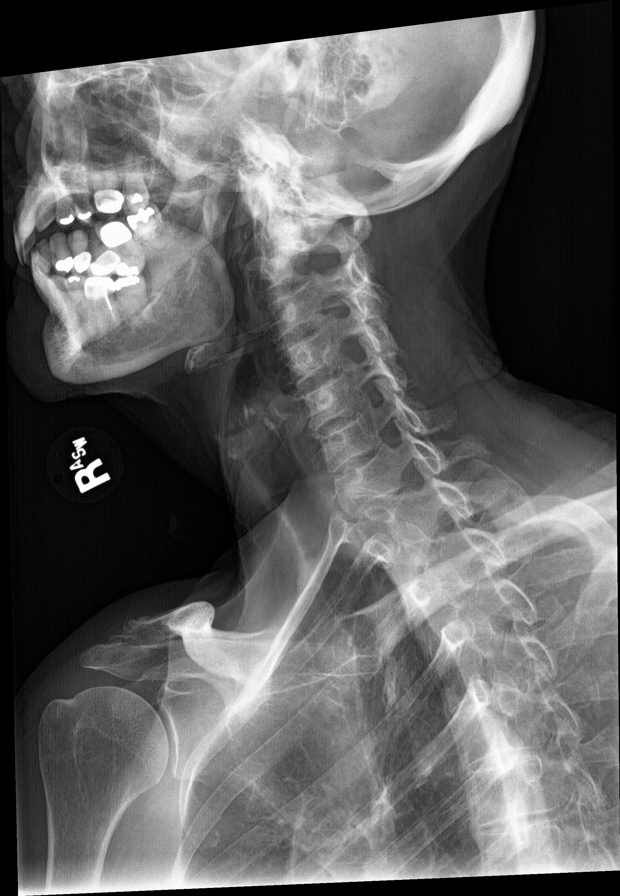

[c-spine ap]
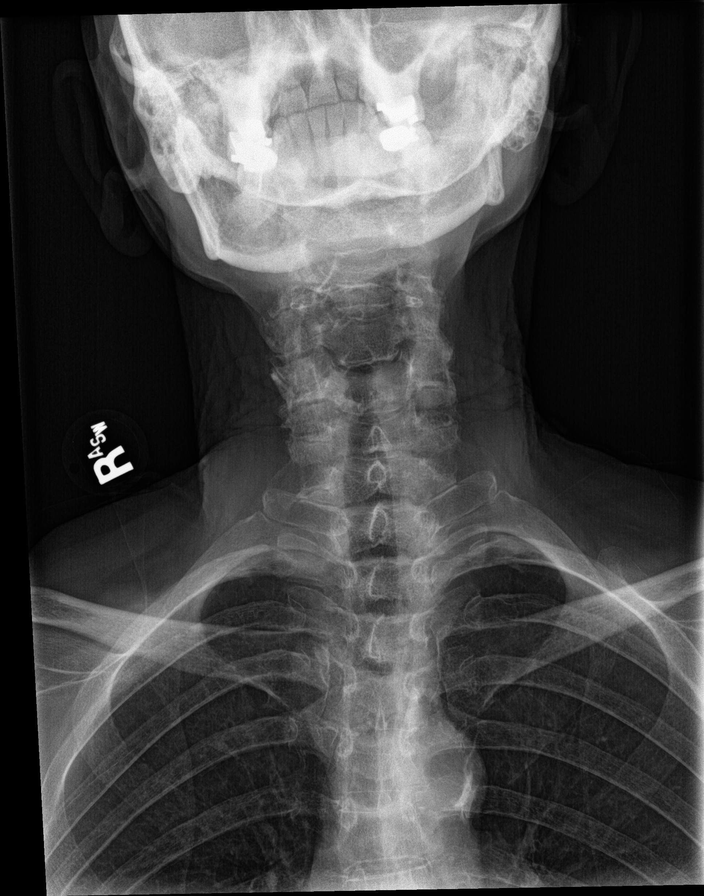

[c-spine open mouth (1 of 2)]
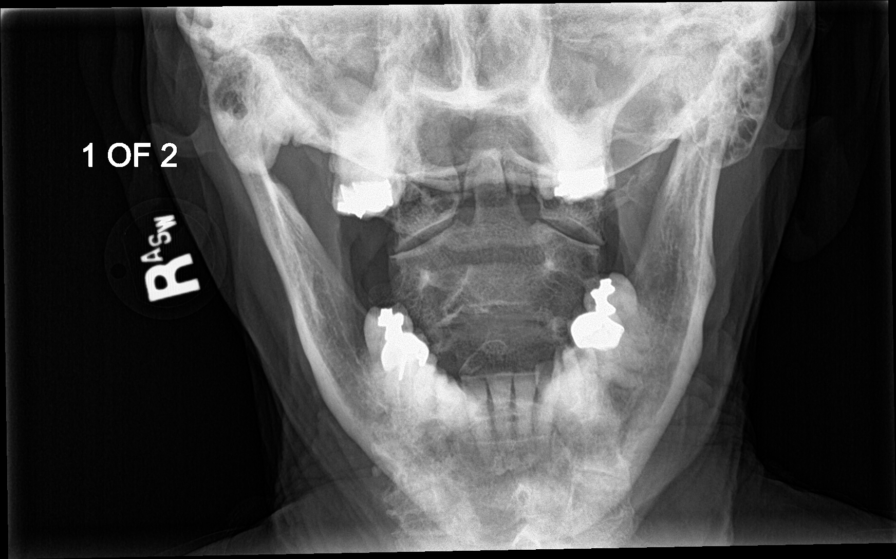

[ct-spine swimmers (1 of 2)]
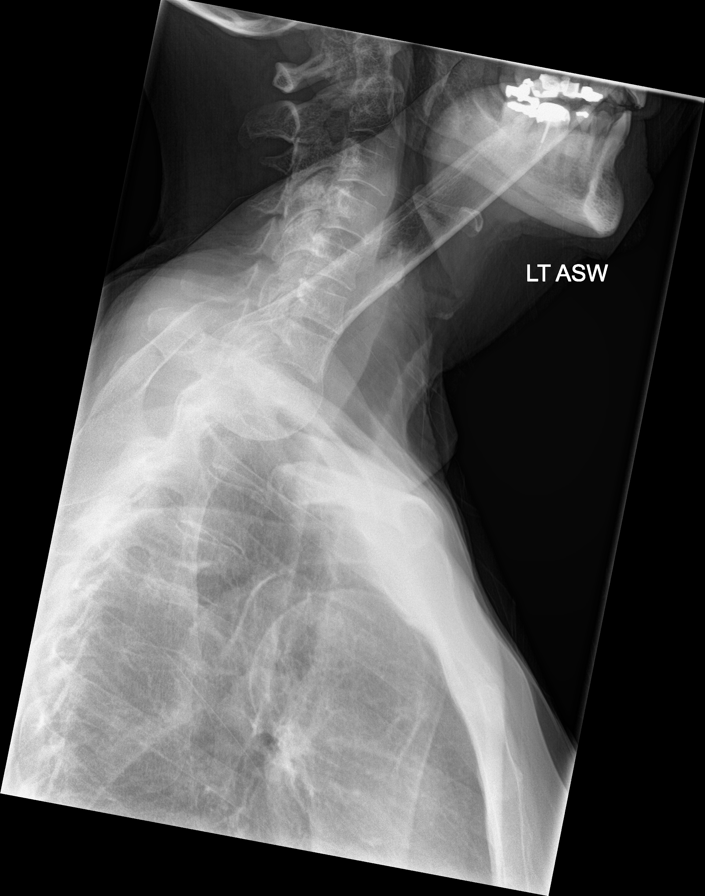

[ct-spine swimmers (2 of 2)]
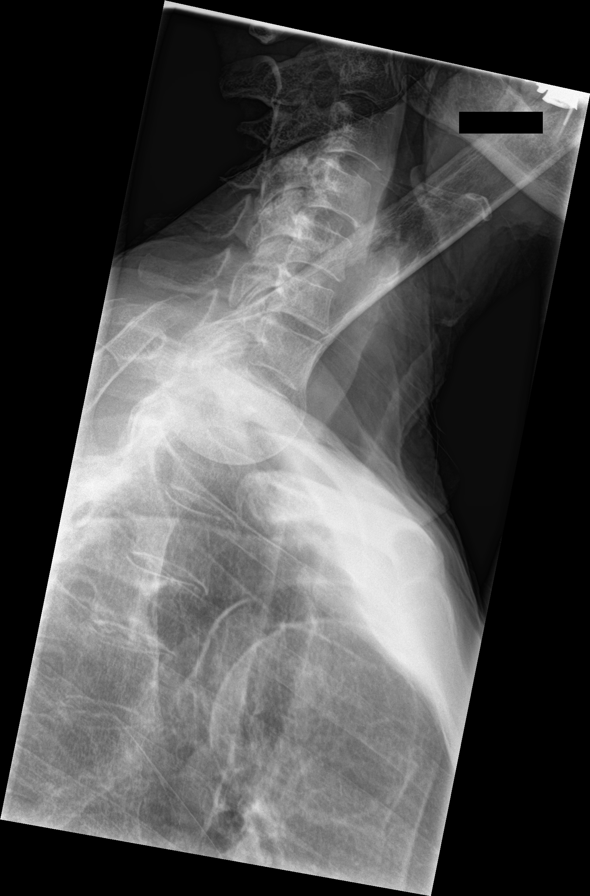

[c-spine obl (2 of 2)]
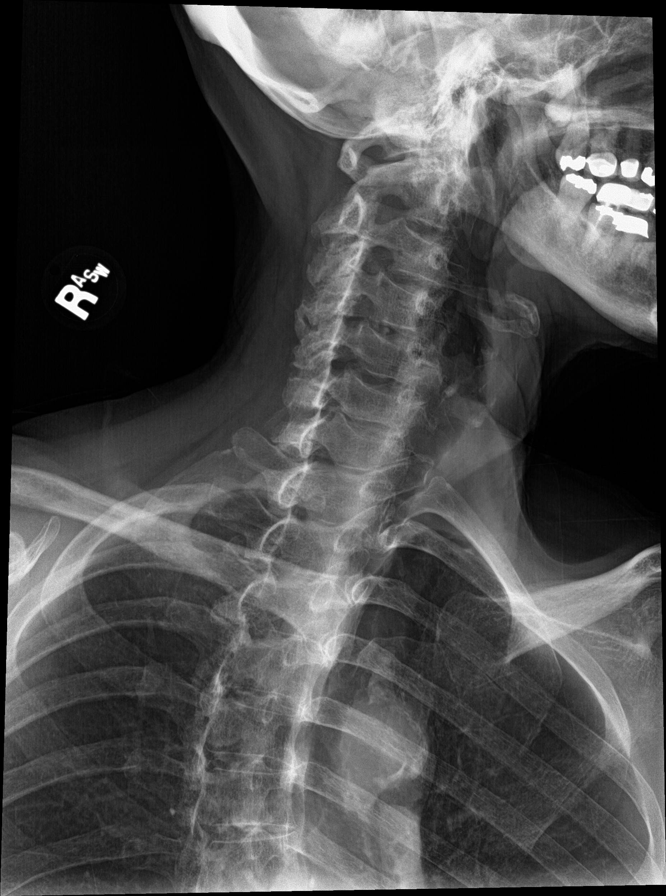

[c-spine open mouth (2 of 2)]
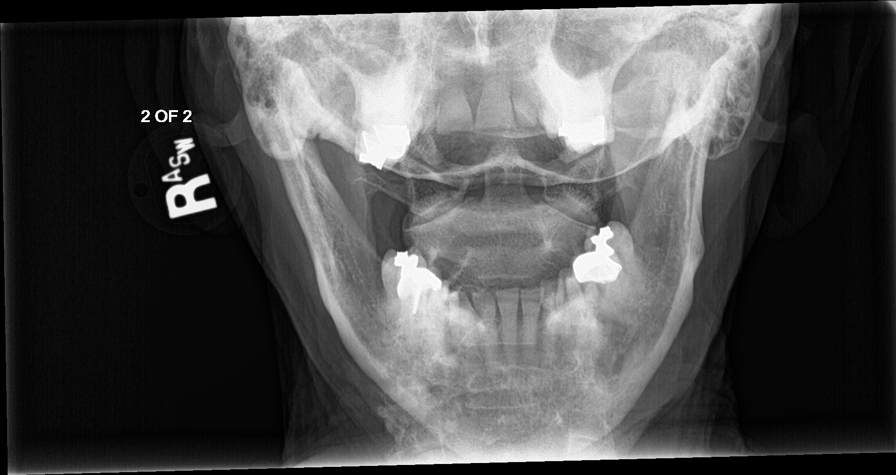

[8 of 8 positions shown; findings below may reference images not displayed]

FINDINGS: Multilevel facet degenerative changes. Minimal anterior spur
formation at the C6-7 level. No prevertebral soft tissue swelling,
fractures or subluxations. Mild foraminal stenosis on the left at
the C5-6 level.
IMPRESSION: 1. No fracture or subluxation.
2. Degenerative changes.

## 2020-05-20 IMAGING — CR DG ABDOMEN 2V
2 series · 2 of 2 positions shown · non-contrast
Comparison: None.

CLINICAL DATA: Lower abdominal pain and constipation for the past 2
weeks.

EXAM:
ABDOMEN - 2 VIEW

[abdomen erect]
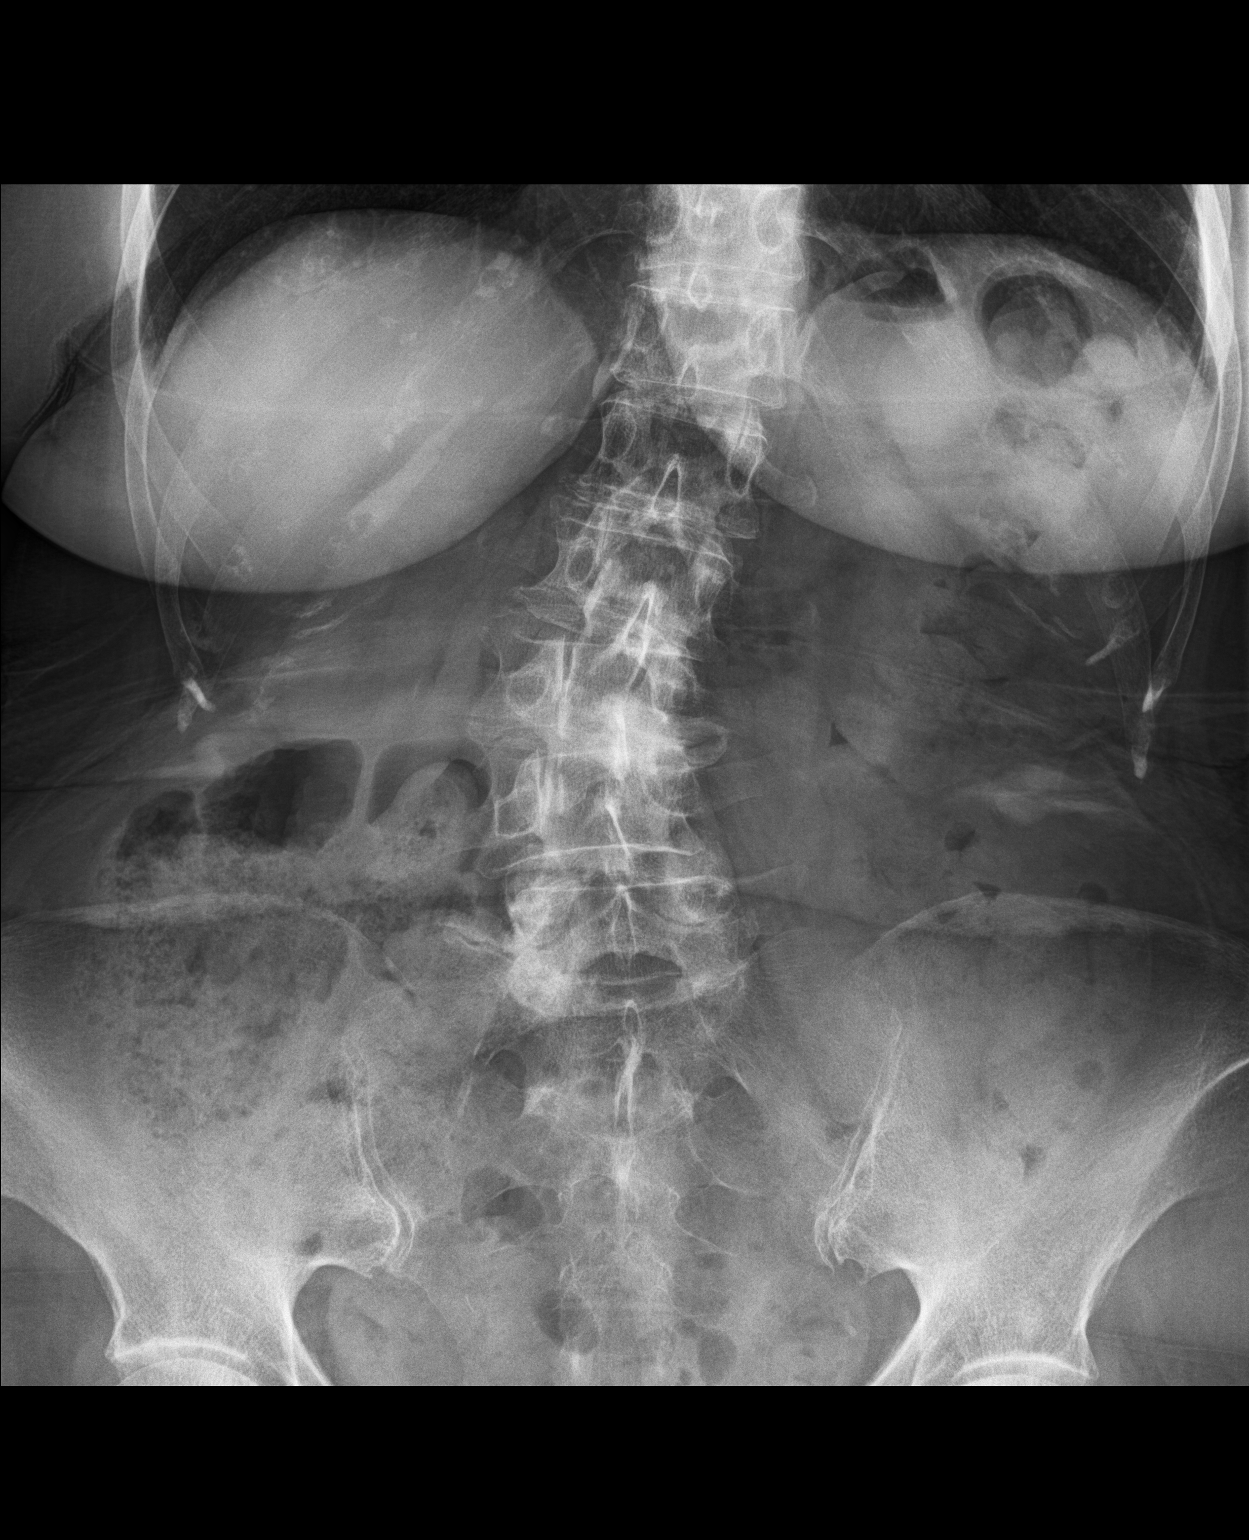

[abdomen supine]
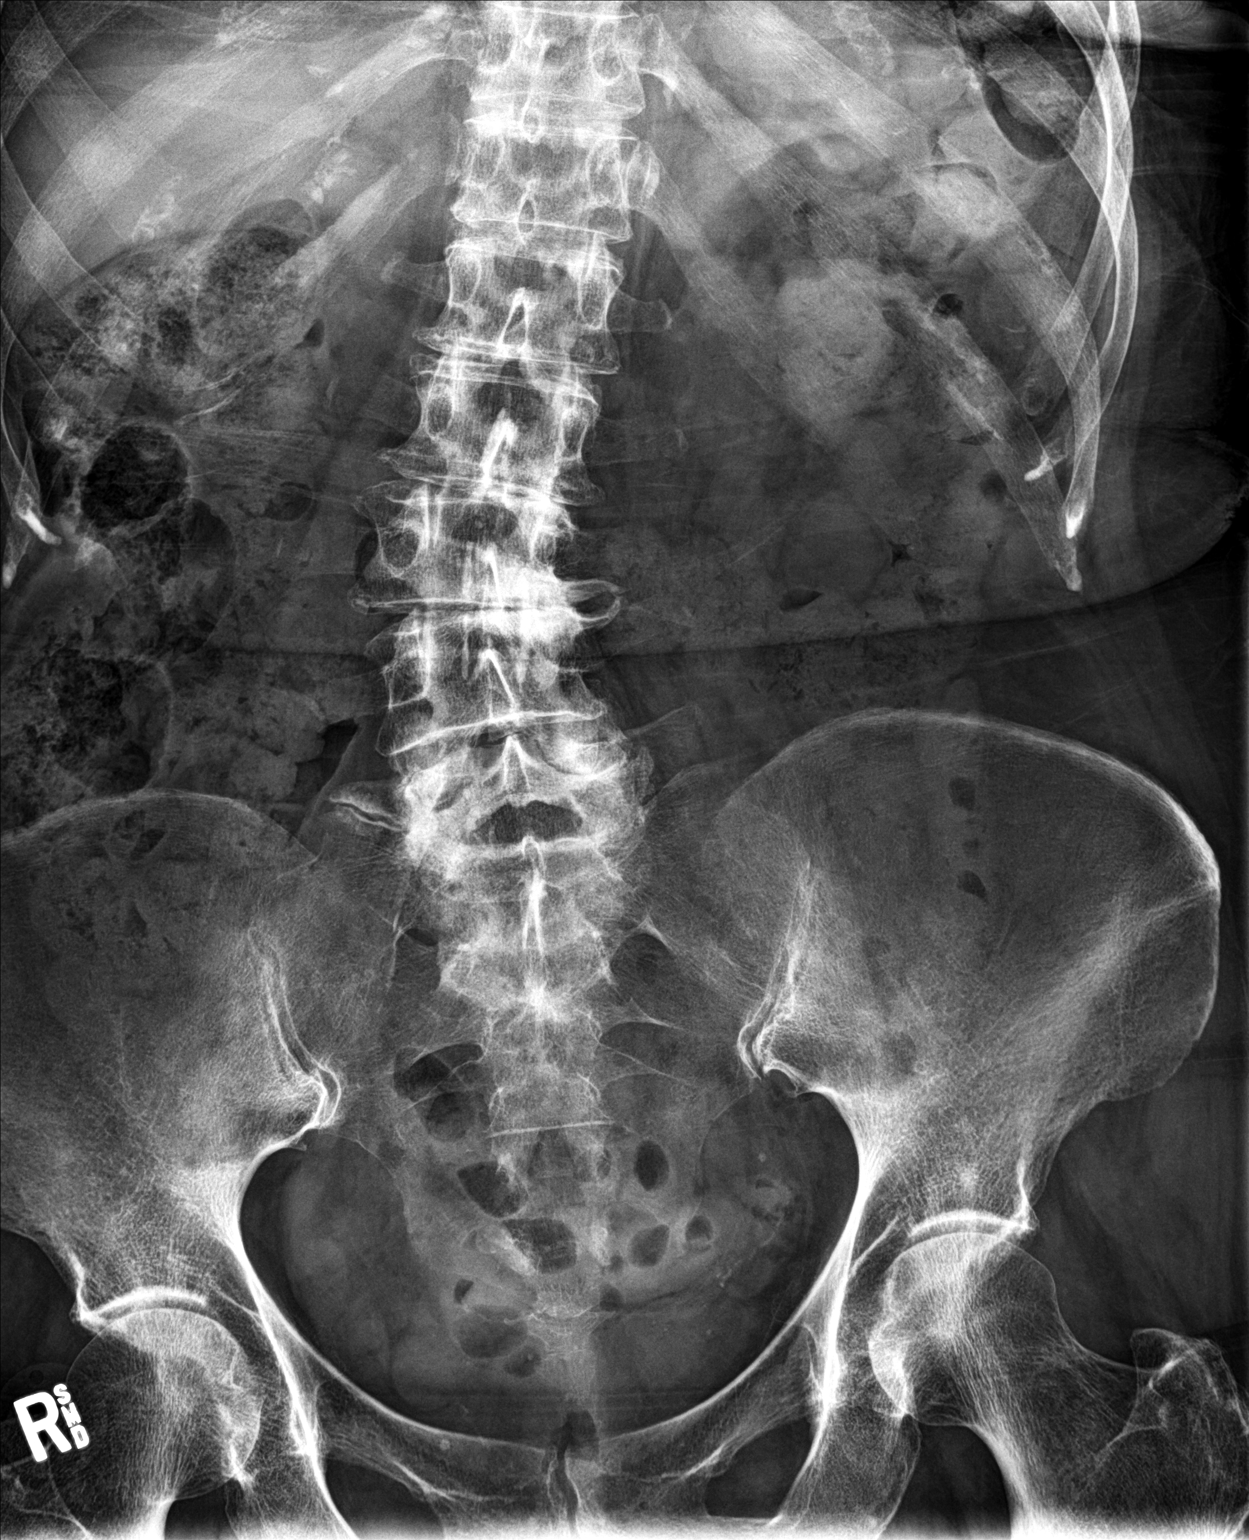

[2 of 2 positions shown; findings below may reference images not displayed]

FINDINGS: The bowel gas pattern is normal. Mild to moderate increased colonic
stool burden. There is no evidence of free air. No radio-opaque
calculi or other significant radiographic abnormality is seen. No
acute osseous abnormality. Lumbar dextroscoliosis.
IMPRESSION: 1. No acute findings.
2. Mild to moderate increased colonic stool burden.

These results will be called to the ordering clinician or
representative by the [HOSPITAL] at the imaging location.

## 2020-07-06 ENCOUNTER — Encounter: Payer: Self-pay | Admitting: Family Medicine

## 2020-07-06 ENCOUNTER — Ambulatory Visit: Payer: Medicare Other | Admitting: Family Medicine

## 2020-07-06 ENCOUNTER — Other Ambulatory Visit: Payer: Self-pay

## 2020-07-06 VITALS — BP 134/82 | HR 61 | Ht 64.0 in | Wt 168.0 lb

## 2020-07-06 DIAGNOSIS — K130 Diseases of lips: Secondary | ICD-10-CM | POA: Diagnosis not present

## 2020-07-06 DIAGNOSIS — G4709 Other insomnia: Secondary | ICD-10-CM

## 2020-07-06 DIAGNOSIS — E7849 Other hyperlipidemia: Secondary | ICD-10-CM

## 2020-07-06 DIAGNOSIS — I10 Essential (primary) hypertension: Secondary | ICD-10-CM | POA: Diagnosis not present

## 2020-07-06 MED ORDER — LISINOPRIL 10 MG PO TABS: 10.0000 mg | ORAL_TABLET | Freq: Every day | ORAL | 1 refills | Status: AC

## 2020-07-06 MED ORDER — CLOTRIMAZOLE-BETAMETHASONE 1-0.05 % EX CREA: 1.0000 "application " | TOPICAL_CREAM | Freq: Two times a day (BID) | CUTANEOUS | 0 refills | Status: AC

## 2020-07-06 MED ORDER — MIRTAZAPINE 15 MG PO TABS: 15.0000 mg | ORAL_TABLET | Freq: Every day | ORAL | 1 refills | Status: AC

## 2020-07-06 MED ORDER — METOPROLOL SUCCINATE ER 50 MG PO TB24: ORAL_TABLET | ORAL | 1 refills | Status: AC

## 2020-07-06 NOTE — Progress Notes (Signed)
Date:  07/06/2020   Name:  Karen Hooper   DOB:  1932-02-14   MRN:  [id number]   Chief Complaint: Hypertension and Hyperlipidemia  Hypertension This is a chronic problem. The current episode started more than 1 year ago. The problem has been gradually improving since onset. The problem is controlled. Associated symptoms include malaise/fatigue. Pertinent negatives include no anxiety, blurred vision, chest pain, headaches, neck pain, orthopnea, palpitations, peripheral edema, PND, shortness of breath or sweats. There are no associated agents to hypertension. Risk factors for coronary artery disease include dyslipidemia. Past treatments include beta blockers and ACE inhibitors. The current treatment provides moderate improvement. There are no compliance problems.  There is no history of angina, kidney disease, CAD/MI, CVA, heart failure, left ventricular hypertrophy, PVD or retinopathy. There is no history of chronic renal disease, a hypertension causing med or renovascular disease.  Hyperlipidemia This is a chronic problem. The current episode started more than 1 year ago. The problem is controlled. Recent lipid tests were reviewed and are normal. She has no history of chronic renal disease. Pertinent negatives include no chest pain, myalgias or shortness of breath. Current antihyperlipidemic treatment includes diet change (omega 3).  Insomnia Primary symptoms: no fragmented sleep, no sleep disturbance, no difficulty falling asleep, no somnolence, no frequent awakening, no premature morning awakening, malaise/fatigue, napping.   The onset quality is gradual. The treatment provided mild relief.    Lab Results  Component Value Date   CREATININE 1.11 (H) 02/14/2020   BUN 32 (H) 02/14/2020   NA 140 02/14/2020   K 4.2 02/14/2020   CL 109 02/14/2020   CO2 24 02/14/2020   Lab Results  Component Value Date   CHOL 180 07/03/2019   HDL 65 07/03/2019   LDLCALC 95 07/03/2019   TRIG 115  07/03/2019   CHOLHDL 3.0 12/26/2017   No results found for: TSH No results found for: HGBA1C Lab Results  Component Value Date   WBC 5.7 02/14/2020   HGB 12.5 02/14/2020   HCT 36.2 02/14/2020   MCV 95.5 02/14/2020   PLT 117 (L) 02/14/2020   Lab Results  Component Value Date   ALT 15 07/03/2019   AST 29 07/03/2019   ALKPHOS 83 07/03/2019   BILITOT 0.5 07/03/2019     Review of Systems  Constitutional: Positive for malaise/fatigue. Negative for chills, fatigue, fever and unexpected weight change.  HENT: Negative for congestion, ear discharge, ear pain, rhinorrhea, sinus pressure, sneezing and sore throat.   Eyes: Negative for blurred vision, double vision, photophobia, pain, discharge, redness and itching.  Respiratory: Negative for cough, shortness of breath, wheezing and stridor.   Cardiovascular: Negative for chest pain, palpitations, orthopnea and PND.  Gastrointestinal: Negative for abdominal pain, blood in stool, constipation, diarrhea, nausea and vomiting.  Endocrine: Negative for cold intolerance, heat intolerance, polydipsia, polyphagia and polyuria.  Genitourinary: Negative for dysuria, flank pain, frequency, hematuria, menstrual problem, pelvic pain, urgency, vaginal bleeding and vaginal discharge.  Musculoskeletal: Negative for arthralgias, back pain, myalgias and neck pain.  Skin: Negative for rash.  Allergic/Immunologic: Negative for environmental allergies and food allergies.  Neurological: Negative for dizziness, weakness, light-headedness, numbness and headaches.  Hematological: Negative for adenopathy. Does not bruise/bleed easily.  Psychiatric/Behavioral: Negative for dysphoric mood and sleep disturbance. The patient has insomnia. The patient is not nervous/anxious.     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   Allergic rhinitis 11/13/2014   Anxiety 11/13/2014   Essential hypertension 11/13/2014   Routine general medical examination at a health care facility 11/13/2014   Hot flash, menopausal 11/13/2014   Screening for depression 11/13/2014    Allergies  Allergen Reactions   Atorvastatin    Etodolac    Ezetimibe     Past Surgical History:  Procedure Laterality Date   CATARACT EXTRACTION, BILATERAL     ESOPHAGOGASTRODUODENOSCOPY (EGD) WITH PROPOFOL N/A 09/07/2018   Procedure: ESOPHAGOGASTRODUODENOSCOPY (EGD) WITH PROPOFOL;  Surgeon: Pasty Spillers, MD;  Location: [facility] ENDOSCOPY;  Service: Endoscopy;  Laterality: N/A;   EYE SURGERY     VAGINAL HYSTERECTOMY      Social History   Tobacco Use   Smoking status: Never Smoker   Smokeless tobacco: Never Used  Vaping Use   Vaping Use: Never used  Substance Use Topics   Alcohol use: No    Alcohol/week: 0.0 standard drinks   Drug use: Never     Medication list has been reviewed and updated.  Current Meds  Medication Sig   calcium carbonate (OS-CAL - DOSED IN MG OF ELEMENTAL CALCIUM) 1250 (500 Ca) MG tablet Take 1 tablet by mouth.   cholecalciferol (VITAMIN D3) 25 MCG (1000 UNIT) tablet Take 1,000 Units by mouth daily.   lisinopril (ZESTRIL) 10 MG tablet Take 1 tablet (10 mg total) by mouth daily.   metoprolol succinate (TOPROL-XL) 50 MG 24 hr tablet TAKE (1) TABLET BY MOUTH EVERY DAY   mirtazapine (REMERON) 15 MG tablet Take 0.5 tablets (7.5 mg total) by mouth at bedtime. (Patient taking differently: Take 15 mg by mouth at bedtime.)   mupirocin ointment (BACTROBAN) 2 % Apply 1 application topically 2 (two) times daily.   Omega 3 1000 MG CAPS Take 2 capsules (2,000 mg total) by mouth 2 (two) times daily.   vitamin B-12 (CYANOCOBALAMIN) 500 MCG tablet Take 500 mcg by mouth daily.     PHQ 2/9 Scores 07/06/2020 04/02/2020 01/02/2020 07/31/2019  PHQ - 2 Score 0 0 0 0  PHQ- 9 Score 0 0 0 -    GAD 7 : Generalized Anxiety Score 07/06/2020 04/02/2020 01/02/2020 07/03/2019  Nervous, Anxious, on Edge 0 0 0 0  Control/stop worrying 0 0 0 0  Worry too much - different things 0 0 0 0  Trouble relaxing 0 0 0 0  Restless 0 0 0 0  Easily annoyed or irritable 0 0 0 0  Afraid - awful might happen 0 0 0 0  Total GAD 7 Score 0 0 0 0  Anxiety Difficulty Not difficult at all - Not difficult at all -    BP Readings from Last 3 Encounters:  07/06/20 134/82  05/07/20 130/80  04/16/20 124/70    Physical Exam Vitals and nursing note reviewed.  Constitutional:      General: She is not in acute distress.    Appearance: She is not diaphoretic.  HENT:     Head: Normocephalic and atraumatic.     Right Ear: External ear normal.     Left Ear: External ear normal.     Nose: Nose normal.     Mouth/Throat:     Mouth: Oropharynx is clear and moist. Mucous membranes are moist.     Pharynx: No oropharyngeal exudate or posterior oropharyngeal  erythema.  Eyes:     General:        Right eye: No discharge.        Left eye: No discharge.     Extraocular Movements: EOM normal.     Conjunctiva/sclera: Conjunctivae normal.     Pupils: Pupils are equal, round, and reactive to light.  Neck:     Thyroid: No thyromegaly.     Vascular: No JVD.  Cardiovascular:     Rate and Rhythm: Normal rate and regular rhythm.     Pulses: Intact distal pulses.     Heart sounds: Normal heart sounds. No murmur heard. No friction rub. No gallop.   Pulmonary:     Effort: Pulmonary effort is normal.     Breath sounds: Normal breath sounds. No wheezing or rhonchi.  Abdominal:     General: Bowel sounds are normal.     Palpations: Abdomen is soft. There is no mass.     Tenderness: There is no abdominal tenderness. There is no guarding or rebound.  Musculoskeletal:        General: No edema. Normal range of  motion.     Cervical back: Normal range of motion and neck supple.  Lymphadenopathy:     Cervical: No cervical adenopathy.  Skin:    General: Skin is warm and dry.  Neurological:     Mental Status: She is alert.     Deep Tendon Reflexes: Reflexes are normal and symmetric.     Wt Readings from Last 3 Encounters:  07/06/20 168 lb (76.2 kg)  05/07/20 154 lb (69.9 kg)  04/16/20 154 lb (69.9 kg)    BP 134/82    Pulse 61    Ht 5\' 4"  (1.626 m)    Wt 168 lb (76.2 kg)    SpO2 98%    BMI 28.84 kg/m   Assessment and Plan:  1. Essential hypertension Chronic. Controlled. Stable. Blood pressure is 134/82. We'll continue lisinopril 10 mg once a day and metoprolol XL 50 mg once a day. BMP was reviewed and for the most part patient GFR and electrolytes were in acceptable range we'll recheck in 6 months. - lisinopril (ZESTRIL) 10 MG tablet; Take 1 tablet (10 mg total) by mouth daily.  Dispense: 90 tablet; Refill: 1 - metoprolol succinate (TOPROL-XL) 50 MG 24 hr tablet; TAKE (1) TABLET BY MOUTH EVERY DAY  Dispense: 90 tablet; Refill: 1  2. Other insomnia Chronic. Controlled. Stable. Continue mirtazapine 15 mg once at night. - mirtazapine (REMERON) 15 MG tablet; Take 1 tablet (15 mg total) by mouth at bedtime.  Dispense: 90 tablet; Refill: 1  3. Familial multiple lipoprotein-type hyperlipidemia . Controlled. Stable. Patient will continue omega-3 for reduction of lipids and encourage moderation intake of cholesterol and lipid in diet.  4. Angular cheilitis New onset. Episodic. Stable. Patient is having some angular cheilitis noted particularly after sleeping at night. Patient information has been given concerning cleaning the area some angular areas and application of Lotrisone on a as needed basis. - clotrimazole-betamethasone (LOTRISONE) cream; Apply 1 application topically 2 (two) times daily.  Dispense: 30 g; Refill: 0

## 2020-07-31 DIAGNOSIS — F0281 Dementia in other diseases classified elsewhere with behavioral disturbance: Secondary | ICD-10-CM | POA: Diagnosis not present

## 2020-07-31 DIAGNOSIS — G301 Alzheimer's disease with late onset: Secondary | ICD-10-CM | POA: Diagnosis not present

## 2020-08-03 ENCOUNTER — Ambulatory Visit (INDEPENDENT_AMBULATORY_CARE_PROVIDER_SITE_OTHER): Payer: Medicare Other

## 2020-08-03 DIAGNOSIS — Z0000 Encounter for general adult medical examination without abnormal findings: Secondary | ICD-10-CM | POA: Diagnosis not present

## 2020-08-03 NOTE — Patient Instructions (Signed)
Karen Hooper , Thank you for taking time to come for your Medicare Wellness Visit. I appreciate your ongoing commitment to your health goals. Please review the following plan we discussed and let me know if I can assist you in the future.   Screening recommendations/referrals: Colonoscopy: no longer required Mammogram: no longer required Bone Density: done 01/24/19 Recommended yearly ophthalmology/optometry visit for glaucoma screening and checkup Recommended yearly dental visit for hygiene and checkup  Vaccinations: Influenza vaccine: done 04/02/20 Pneumococcal vaccine: done 12/23/15 Tdap vaccine: done 09/20/19 Shingles vaccine: Shingrix discussed. Please contact your pharmacy for coverage information.  Covid-19: done 07/29/19, 08/19/19 & 04/24/20  Conditions/risks identified: Recommend drinking 6-8 glasses of water per day   Next appointment: Follow up in one year for your annual wellness visit    Preventive Care 65 Years and Older, Female Preventive care refers to lifestyle choices and visits with your health care provider that can promote health and wellness. What does preventive care include?  A yearly physical exam. This is also called an annual well check.  Dental exams once or twice a year.  Routine eye exams. Ask your health care provider how often you should have your eyes checked.  Personal lifestyle choices, including:  Daily care of your teeth and gums.  Regular physical activity.  Eating a healthy diet.  Avoiding tobacco and drug use.  Limiting alcohol use.  Practicing safe sex.  Taking low-dose aspirin every day.  Taking vitamin and mineral supplements as recommended by your health care provider. What happens during an annual well check? The services and screenings done by your health care provider during your annual well check will depend on your age, overall health, lifestyle risk factors, and family history of disease. Counseling  Your health care provider  may ask you questions about your:  Alcohol use.  Tobacco use.  Drug use.  Emotional well-being.  Home and relationship well-being.  Sexual activity.  Eating habits.  History of falls.  Memory and ability to understand (cognition).  Work and work Astronomer.  Reproductive health. Screening  You may have the following tests or measurements:  Height, weight, and BMI.  Blood pressure.  Lipid and cholesterol levels. These may be checked every 5 years, or more frequently if you are over 50 years old.  Skin check.  Lung cancer screening. You may have this screening every year starting at age 35 if you have a 30-pack-year history of smoking and currently smoke or have quit within the past 15 years.  Fecal occult blood test (FOBT) of the stool. You may have this test every year starting at age 67.  Flexible sigmoidoscopy or colonoscopy. You may have a sigmoidoscopy every 5 years or a colonoscopy every 10 years starting at age 28.  Hepatitis C blood test.  Hepatitis B blood test.  Sexually transmitted disease (STD) testing.  Diabetes screening. This is done by checking your blood sugar (glucose) after you have not eaten for a while (fasting). You may have this done every 1-3 years.  Bone density scan. This is done to screen for osteoporosis. You may have this done starting at age 39.  Mammogram. This may be done every 1-2 years. Talk to your health care provider about how often you should have regular mammograms. Talk with your health care provider about your test results, treatment options, and if necessary, the need for more tests. Vaccines  Your health care provider may recommend certain vaccines, such as:  Influenza vaccine. This is recommended every year.  Tetanus, diphtheria, and acellular pertussis (Tdap, Td) vaccine. You may need a Td booster every 10 years.  Zoster vaccine. You may need this after age 3.  Pneumococcal 13-valent conjugate (PCV13) vaccine.  One dose is recommended after age 82.  Pneumococcal polysaccharide (PPSV23) vaccine. One dose is recommended after age 60. Talk to your health care provider about which screenings and vaccines you need and how often you need them. This information is not intended to replace advice given to you by your health care provider. Make sure you discuss any questions you have with your health care provider. Document Released: 07/24/2015 Document Revised: 03/16/2016 Document Reviewed: 04/28/2015 Elsevier Interactive Patient Education  2017 [place] Prevention in the Home Falls can cause injuries. They can happen to people of all ages. There are many things you can do to make your home safe and to help prevent falls. What can I do on the outside of my home?  Regularly fix the edges of walkways and driveways and fix any cracks.  Remove anything that might make you trip as you walk through a door, such as a raised step or threshold.  Trim any bushes or trees on the path to your home.  Use bright outdoor lighting.  Clear any walking paths of anything that might make someone trip, such as rocks or tools.  Regularly check to see if handrails are loose or broken. Make sure that both sides of any steps have handrails.  Any raised decks and porches should have guardrails on the edges.  Have any leaves, snow, or ice cleared regularly.  Use sand or salt on walking paths during winter.  Clean up any spills in your garage right away. This includes oil or grease spills. What can I do in the bathroom?  Use night lights.  Install grab bars by the toilet and in the tub and shower. Do not use towel bars as grab bars.  Use non-skid mats or decals in the tub or shower.  If you need to sit down in the shower, use a plastic, non-slip stool.  Keep the floor dry. Clean up any water that spills on the floor as soon as it happens.  Remove soap buildup in the tub or shower regularly.  Attach bath  mats securely with double-sided non-slip rug tape.  Do not have throw rugs and other things on the floor that can make you trip. What can I do in the bedroom?  Use night lights.  Make sure that you have a light by your bed that is easy to reach.  Do not use any sheets or blankets that are too big for your bed. They should not hang down onto the floor.  Have a firm chair that has side arms. You can use this for support while you get dressed.  Do not have throw rugs and other things on the floor that can make you trip. What can I do in the kitchen?  Clean up any spills right away.  Avoid walking on wet floors.  Keep items that you use a lot in easy-to-reach places.  If you need to reach something above you, use a strong step stool that has a grab bar.  Keep electrical cords out of the way.  Do not use floor polish or wax that makes floors slippery. If you must use wax, use non-skid floor wax.  Do not have throw rugs and other things on the floor that can make you trip. What can I do  with my stairs?  Do not leave any items on the stairs.  Make sure that there are handrails on both sides of the stairs and use them. Fix handrails that are broken or loose. Make sure that handrails are as long as the stairways.  Check any carpeting to make sure that it is firmly attached to the stairs. Fix any carpet that is loose or worn.  Avoid having throw rugs at the top or bottom of the stairs. If you do have throw rugs, attach them to the floor with carpet tape.  Make sure that you have a light switch at the top of the stairs and the bottom of the stairs. If you do not have them, ask someone to add them for you. What else can I do to help prevent falls?  Wear shoes that:  Do not have high heels.  Have rubber bottoms.  Are comfortable and fit you well.  Are closed at the toe. Do not wear sandals.  If you use a stepladder:  Make sure that it is fully opened. Do not climb a closed  stepladder.  Make sure that both sides of the stepladder are locked into place.  Ask someone to hold it for you, if possible.  Clearly mark and make sure that you can see:  Any grab bars or handrails.  First and last steps.  Where the edge of each step is.  Use tools that help you move around (mobility aids) if they are needed. These include:  Canes.  Walkers.  Scooters.  Crutches.  Turn on the lights when you go into a dark area. Replace any light bulbs as soon as they burn out.  Set up your furniture so you have a clear path. Avoid moving your furniture around.  If any of your floors are uneven, fix them.  If there are any pets around you, be aware of where they are.  Review your medicines with your doctor. Some medicines can make you feel dizzy. This can increase your chance of falling. Ask your doctor what other things that you can do to help prevent falls. This information is not intended to replace advice given to you by your health care provider. Make sure you discuss any questions you have with your health care provider. Document Released: 04/23/2009 Document Revised: 12/03/2015 Document Reviewed: 08/01/2014 Elsevier Interactive Patient Education  2017 [employer].

## 2020-08-03 NOTE — Progress Notes (Signed)
Subjective:   Karen Hooper is a 87 y.o. female who presents for Medicare Annual (Subsequent) preventive examination.  Virtual Visit via Telephone Note  I connected with  [name] on 08/03/20 at  3:20 PM EST by telephone and verified that I am speaking with the correct person using two identifiers.  Location: Patient: home Provider: [facility] Persons participating in the virtual visit: patient & daughter [name] [facility] Health Advisor   I discussed the limitations, risks, security and privacy concerns of performing an evaluation and management service by telephone and the availability of in person appointments. The patient expressed understanding and agreed to proceed.  Interactive audio and video telecommunications were attempted between this nurse and patient, however failed, due to patient having technical difficulties OR patient did not have access to video capability.  We continued and completed visit with audio only.  Some vital signs may be absent or patient reported.   [name], LPN    Review of Systems     Cardiac Risk Factors include: advanced age (>37men, >51 women);hypertension     Objective:    There were no vitals filed for this visit. There is no height or weight on file to calculate BMI.  Advanced Directives 08/03/2020 02/14/2020 12/13/2019 07/31/2019 09/07/2018 08/27/2018 02/17/2015  Does Patient Have a Medical Advance Directive? Unable to assess, patient is non-responsive or altered mental status No No No No No Yes  Type of Advance Directive - - - - - - Living will  Would patient like information on creating a medical advance directive? - - - No - Patient declined - - -    Current Medications (verified) Outpatient Encounter Medications as of 08/03/2020  Medication Sig  . acetaminophen (TYLENOL) 325 MG tablet Take 325 mg by mouth at bedtime.  . calcium carbonate (OS-CAL - DOSED IN MG OF ELEMENTAL CALCIUM) 1250 (500 Ca) MG tablet Take 1 tablet by mouth.  .  cholecalciferol (VITAMIN D3) 25 MCG (1000 UNIT) tablet Take 1,000 Units by mouth daily.  Marland Kitchen lisinopril (ZESTRIL) 10 MG tablet Take 1 tablet (10 mg total) by mouth daily.  . metoprolol succinate (TOPROL-XL) 50 MG 24 hr tablet TAKE (1) TABLET BY MOUTH EVERY DAY  . mirtazapine (REMERON) 15 MG tablet Take 1 tablet (15 mg total) by mouth at bedtime.  . Omega 3 1000 MG CAPS Take 2 capsules (2,000 mg total) by mouth 2 (two) times daily.  . vitamin B-12 (CYANOCOBALAMIN) 500 MCG tablet Take 500 mcg by mouth daily.  . clotrimazole-betamethasone (LOTRISONE) cream Apply 1 application topically 2 (two) times daily. (Patient not taking: Reported on 08/03/2020)  . [DISCONTINUED] mupirocin ointment (BACTROBAN) 2 % Apply 1 application topically 2 (two) times daily.   No facility-administered encounter medications on file as of 08/03/2020.    Allergies (verified) Atorvastatin, Etodolac, and Ezetimibe   History: Past Medical History:  Diagnosis Date  . Allergy   . Anxiety   . Dementia (HCC)   . Hyperlipidemia   . Hypertension    Past Surgical History:  Procedure Laterality Date  . CATARACT EXTRACTION, BILATERAL    . ESOPHAGOGASTRODUODENOSCOPY (EGD) WITH PROPOFOL N/A 09/07/2018   Procedure: ESOPHAGOGASTRODUODENOSCOPY (EGD) WITH PROPOFOL;  Surgeon: Pasty Spillers, MD;  Location: [facility] ENDOSCOPY;  Service: Endoscopy;  Laterality: N/A;  . EYE SURGERY    . VAGINAL HYSTERECTOMY     Family History  Problem Relation Age of Onset  . Breast cancer Sister 23  . Breast cancer Daughter 53   Social History  Socioeconomic History  . Marital status: Married    Spouse name: Not on file  . Number of children: 2  . Years of education: Not on file  . Highest education level: Not on file  Occupational History  . Not on file  Tobacco Use  . Smoking status: Never Smoker  . Smokeless tobacco: Never Used  Vaping Use  . Vaping Use: Never used  Substance and Sexual Activity  . Alcohol use: No     Alcohol/week: 0.0 standard drinks  . Drug use: Never  . Sexual activity: Not Currently    Birth control/protection: Abstinence  Other Topics Concern  . Not on file  Social History Narrative  . Not on file   Social Determinants of Health   Financial Resource Strain: Low Risk   . Difficulty of Paying Living Expenses: Not hard at all  Food Insecurity: No Food Insecurity  . Worried About Programme researcher, broadcasting/film/video in the Last Year: Never true  . Ran Out of Food in the Last Year: Never true  Transportation Needs: No Transportation Needs  . Lack of Transportation (Medical): No  . Lack of Transportation (Non-Medical): No  Physical Activity: Inactive  . Days of Exercise per Week: 0 days  . Minutes of Exercise per Session: 0 min  Stress: No Stress Concern Present  . Feeling of Stress : Only a little  Social Connections: Moderately Isolated  . Frequency of Communication with Friends and Family: Never  . Frequency of Social Gatherings with Friends and Family: More than three times a week  . Attends Religious Services: Never  . Active Member of Clubs or Organizations: No  . Attends Banker Meetings: Never  . Marital Status: Married    Tobacco Counseling Counseling given: Not Answered   Clinical Intake:  Pre-visit preparation completed: Yes  Pain : No/denies pain     Nutritional Risks: None Diabetes: No  How often do you need to have someone help you when you read instructions, pamphlets, or other written materials from your doctor or pharmacy?: 5 - Always    Interpreter Needed?: No  Information entered by :: [name] LPN   Activities of Daily Living In your present state of health, do you have any difficulty performing the following activities: 08/03/2020 07/06/2020  Hearing? N N  Comment declines hearing aids -  Vision? N N  Difficulty concentrating or making decisions? Y N  Walking or climbing stairs? Y Y  Dressing or bathing? Y Y  Doing errands,  shopping? [name]  Preparing Food and eating ? Y -  Comment does not cook -  Using the Toilet? N -  In the past six months, have you accidently leaked urine? Y -  Do you have problems with loss of bowel control? Y -  Managing your Medications? Y -  Managing your Finances? Y -  Housekeeping or managing your Housekeeping? Y -  Some recent data might be hidden    Patient Care Team: Duanne Limerick, MD as PCP - General (Family Medicine)  Indicate any recent Medical Services you may have received from other than [facility] providers in the past year (date may be approximate).     Assessment:   This is a routine wellness examination for Makaylyn.  Hearing/Vision screen  Hearing Screening   125Hz  250Hz  500Hz  1000Hz  2000Hz  3000Hz  4000Hz  6000Hz  8000Hz   Right ear:           Left ear:  Comments: Pt denies hearing difficulty  Vision Screening Comments: [facility] screenings [facility] Dr. Melanie Crazier  Dietary issues and exercise activities discussed: Current Exercise Habits: The patient does not participate in regular exercise at present, Exercise limited by: orthopedic condition(s);neurologic condition(s)  Goals    . DIET - INCREASE WATER INTAKE     Recommend drinking 6-8 glasses of water per day       Depression Screen PHQ 2/9 Scores 08/03/2020 07/06/2020 04/02/2020 01/02/2020 07/31/2019 07/03/2019 12/31/2018  PHQ - 2 Score 0 0 0 0 0 0 0  PHQ- 9 Score - 0 0 0 - 0 4    Fall Risk Fall Risk  08/03/2020 07/06/2020 04/02/2020 01/02/2020 07/31/2019  Falls in the past year? 0 0 1 1 0  Number falls in past yr: 0 1 1 0 0  Injury with Fall? 0 0 1 1 0  Risk for fall due to : History of fall(s);Impaired balance/gait History of fall(s) - - Impaired balance/gait  Follow up Falls prevention discussed Falls evaluation completed Falls evaluation completed Falls evaluation completed Falls prevention discussed    FALL RISK PREVENTION PERTAINING TO THE HOME:  Any stairs in or around the  home? No  If so, are there any without handrails? No  Home free of loose throw rugs in walkways, pet beds, electrical cords, etc? Yes  Adequate lighting in your home to reduce risk of falls? Yes   ASSISTIVE DEVICES UTILIZED TO PREVENT FALLS:  Life alert? No  Use of a cane, walker or w/c? Yes  Grab bars in the bathroom? Yes  Shower chair or bench in shower? No  Elevated toilet seat or a handicapped toilet? Yes   TIMED UP AND GO:  Was the test performed? No . Telephonic visit.   Cognitive Function: Patient has current diagnosis of cognitive impairment. Patient is followed by neurology for ongoing assessment.      6CIT Screen 07/31/2019  What Year? 0 points  What month? 0 points  What time? 0 points  Count back from 20 0 points  Months in reverse 2 points  Repeat phrase 4 points  Total Score 6    Immunizations Immunization History  Administered Date(s) Administered  . Fluad Quad(high Dose 65+) 03/26/2019, 04/02/2020  . Influenza, High Dose Seasonal PF 04/10/2017, 03/30/2018  . Influenza, Seasonal, Injecte, Preservative Fre 04/27/2011  . Influenza,inj,Quad PF,6+ Mos 04/12/2013, 04/14/2014, 04/21/2015, 06/23/2016  . Influenza-Unspecified 04/14/2014  . PFIZER(Purple Top)SARS-COV-2 Vaccination 07/29/2019, 08/19/2019, 04/24/2020  . Pneumococcal Conjugate-13 12/23/2015  . Pneumococcal Polysaccharide-23 04/08/2011  . Tdap 09/20/2019    TDAP status: Up to date  Flu Vaccine status: Up to date  Pneumococcal vaccine status: Up to date  Covid-19 vaccine status: Completed vaccines  Qualifies for Shingles Vaccine? Yes   Zostavax completed No   Shingrix Completed?: No.    Education has been provided regarding the importance of this vaccine. Patient has been advised to call insurance company to determine out of pocket expense if they have not yet received this vaccine. Advised may also receive vaccine at local pharmacy or Health Dept. Verbalized acceptance and  understanding.  Screening Tests Health Maintenance  Topic Date Due  . COVID-19 Vaccine (4 - Booster for Pfizer series) 10/23/2020  . TETANUS/TDAP  09/19/2029  . INFLUENZA VACCINE  Completed  . DEXA SCAN  Completed  . PNA vac Low Risk Adult  Completed    Health Maintenance  There are no preventive care reminders to display for this patient.  Colorectal cancer screening:  No longer required.   Mammogram status: No longer required due to age.  Bone Density status: Completed 01/24/19. Results reflect: Bone density results: OSTEOPENIA. Repeat every 2 years.  Lung Cancer Screening: (Low Dose CT Chest recommended if Age 56-80 years, 30 pack-year currently smoking OR have quit w/in 15years.) does not qualify.   Additional Screening:  Hepatitis C Screening: does not qualify  Vision Screening: Recommended annual ophthalmology exams for early detection of glaucoma and other disorders of the eye. Is the patient up to date with their annual eye exam?  Yes  Who is the provider or what is the name of the office in which the patient attends annual eye exams? [facility] Dr. Druscilla BrowniePorfilio  Dental Screening: Recommended annual dental exams for proper oral hygiene  Community Resource Referral / Chronic Care Management: CRR required this visit?  No   CCM required this visit?  No      Plan:     I have personally reviewed and noted the following in the patient's chart:   . Medical and social history . Use of alcohol, tobacco or illicit drugs  . Current medications and supplements . Functional ability and status . Nutritional status . Physical activity . Advanced directives . List of other physicians . Hospitalizations, surgeries, and ER visits in previous 12 months . Vitals . Screenings to include cognitive, depression, and falls . Referrals and appointments  In addition, I have reviewed and discussed with patient certain preventive protocols, quality metrics, and best practice  recommendations. A written personalized care plan for preventive services as well as general preventive health recommendations were provided to patient.     Reather LittlerKasey Archie Atilano, LPN   4/09/81191/24/2022   Nurse Notes: neuro ordered physical therapy at home that is anticipated to start within the next week.  Per patient's daughter Lupita LeashDonna pt has had a significant decline with dementia over the past few months but patiet's husband is still in the home and Lupita LeashDonna and her brother are in and out frequently for support system.

## 2020-08-21 ENCOUNTER — Encounter: Payer: Self-pay | Admitting: Family Medicine

## 2020-08-21 ENCOUNTER — Ambulatory Visit (INDEPENDENT_AMBULATORY_CARE_PROVIDER_SITE_OTHER): Payer: Medicare Other | Admitting: Family Medicine

## 2020-08-21 ENCOUNTER — Other Ambulatory Visit: Payer: Self-pay

## 2020-08-21 VITALS — BP 134/64 | HR 64 | Ht 64.0 in | Wt 170.0 lb

## 2020-08-21 DIAGNOSIS — Z5320 Procedure and treatment not carried out because of patient's decision for unspecified reasons: Secondary | ICD-10-CM

## 2020-08-21 DIAGNOSIS — R4189 Other symptoms and signs involving cognitive functions and awareness: Secondary | ICD-10-CM

## 2020-08-21 DIAGNOSIS — Z515 Encounter for palliative care: Secondary | ICD-10-CM

## 2020-08-21 NOTE — Progress Notes (Signed)
Date:  08/21/2020   Name:  Karen Hooper   DOB:  24-Jun-1932   MRN:  [id number]   Chief Complaint: face to face for hospice  Karen Hooper is an 87 year old female that presents with a 101 for consideration for hospice placement.  Patient has been having steady decline of her cognitive status and probably has been having some microstrokes that has also contributed to her cognitive and her physical disabilities.  She currently is unable to much other than to stand and pivot and if this is with some difficulty.  We have discussed hospice and incorporation of occupational therapy in order to come up with means of improving access to care and functions.  This includes wheelchair, perhaps a hospital bed, a bedside toilet, and ultimately may be a Hoyer lift for renal that would enable family to be able to help him transfer.   Lab Results  Component Value Date   CREATININE 1.11 (H) 02/14/2020   BUN 32 (H) 02/14/2020   NA 140 02/14/2020   K 4.2 02/14/2020   CL 109 02/14/2020   CO2 24 02/14/2020   Lab Results  Component Value Date   CHOL 180 07/03/2019   HDL 65 07/03/2019   LDLCALC 95 07/03/2019   TRIG 115 07/03/2019   CHOLHDL 3.0 12/26/2017   No results found for: TSH No results found for: HGBA1C Lab Results  Component Value Date   WBC 5.7 02/14/2020   HGB 12.5 02/14/2020   HCT 36.2 02/14/2020   MCV 95.5 02/14/2020   PLT 117 (L) 02/14/2020   Lab Results  Component Value Date   ALT 15 07/03/2019   AST 29 07/03/2019   ALKPHOS 83 07/03/2019   BILITOT 0.5 07/03/2019     Review of Systems  Constitutional: Positive for activity change and appetite change. Negative for chills, diaphoresis, fatigue, fever and unexpected weight change.    Patient Active Problem List   Diagnosis Date Noted  . Epigastric abdominal pain   . Acute peptic ulcer of stomach   . Postpyloric ulcer   . Abnormal CT scan, small bowel   . CKD (chronic kidney disease) stage 3, GFR 30-59 ml/min (HCC)  06/25/2015  . 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 Allergic rhinitis 11/13/2014  . Anxiety 11/13/2014  . Essential hypertension 11/13/2014  . Routine general medical examination at a health care facility 11/13/2014  . Hot flash, menopausal 11/13/2014  . Screening for depression 11/13/2014    Allergies  Allergen Reactions  . Atorvastatin   . Etodolac   . Ezetimibe     Past Surgical History:  Procedure Laterality Date  . CATARACT EXTRACTION, BILATERAL    . ESOPHAGOGASTRODUODENOSCOPY (EGD) WITH PROPOFOL N/A 09/07/2018   Procedure: ESOPHAGOGASTRODUODENOSCOPY (EGD) WITH PROPOFOL;  Surgeon: Pasty Spillers, MD;  Location: [facility] ENDOSCOPY;  Service: Endoscopy;  Laterality: N/A;  . EYE SURGERY    . VAGINAL HYSTERECTOMY      Social History   Tobacco Use  . Smoking status: Never Smoker  . Smokeless tobacco: Never Used  Vaping Use  . Vaping Use: Never used  Substance Use Topics  . Alcohol use: No    Alcohol/week: 0.0 standard drinks  . Drug use: Never     Medication list has been reviewed and updated.  Current Meds  Medication Sig  . acetaminophen (TYLENOL) 325 MG tablet Take 325 mg by mouth at bedtime.  . calcium carbonate (OS-CAL - DOSED IN MG OF ELEMENTAL CALCIUM) 1250 (500 Ca) MG tablet Take  1 tablet by mouth.  . cholecalciferol (VITAMIN D3) 25 MCG (1000 UNIT) tablet Take 1,000 Units by mouth daily.  Marland Kitchen lamoTRIgine (LAMICTAL) 25 MG tablet Take by mouth. neurology  . lisinopril (ZESTRIL) 10 MG tablet Take 1 tablet (10 mg total) by mouth daily.  . metoprolol succinate (TOPROL-XL) 50 MG 24 hr tablet TAKE (1) TABLET BY MOUTH EVERY DAY  . mirtazapine (REMERON) 15 MG tablet Take 1 tablet (15 mg total) by mouth at bedtime.  . Omega 3 1000 MG CAPS Take 2 capsules (2,000 mg total) by mouth 2 (two) times daily.  . vitamin B-12 (CYANOCOBALAMIN) 500 MCG tablet Take 500 mcg by mouth daily.    PHQ 2/9 Scores 08/21/2020 08/03/2020 07/06/2020 04/02/2020  PHQ  - 2 Score 2 0 0 0  PHQ- 9 Score 2 - 0 0    GAD 7 : Generalized Anxiety Score 08/21/2020 07/06/2020 04/02/2020 01/02/2020  Nervous, Anxious, on Edge 0 0 0 0  Control/stop worrying 0 0 0 0  Worry too much - different things 0 0 0 0  Trouble relaxing 0 0 0 0  Restless 1 0 0 0  Easily annoyed or irritable 2 0 0 0  Afraid - awful might happen 0 0 0 0  Total GAD 7 Score 3 0 0 0  Anxiety Difficulty Not difficult at all Not difficult at all - Not difficult at all    BP Readings from Last 3 Encounters:  08/21/20 134/64  07/06/20 134/82  05/07/20 130/80    Physical Exam Vitals and nursing note reviewed.  Constitutional:      Appearance: Normal appearance.  Cardiovascular:     Heart sounds: S1 normal and S2 normal.   No systolic murmur is present.  No diastolic murmur is present. No S3 or S4 sounds.   Pulmonary:     Breath sounds: Normal breath sounds. No decreased breath sounds, wheezing, rhonchi or rales.  Musculoskeletal:     Right lower leg: 1+ Pitting Edema present.     Left lower leg: 1+ Pitting Edema present.  Neurological:     Mental Status: She is alert.     Wt Readings from Last 3 Encounters:  08/21/20 170 lb (77.1 kg)  07/06/20 168 lb (76.2 kg)  05/07/20 154 lb (69.9 kg)    BP 134/64   Pulse 64   Ht 5\' 4"  (1.626 m)   Wt 170 lb (77.1 kg)   BMI 29.18 kg/m   Assessment and Plan: 1. Encounter for admission to hospice care Patient presents for 101 for discussion with family for the possibility of hospice care or palliative care.  We discussed multiple areas of concerns in terms access to facilities and equipment for ongoing decline of patient's status.  2. Cognitive decline There is been an abrupt decrease in cognitive concerns since December with the patient not recognizing myself nor comprehending the circumstances.  I do not think that this is going to be a long process and is likely to be less than 6 months with eventual need for hospice to assist with home care  and eventually the possibility of hospice home care.  3. Assessment of physical health declined There is been a gradual physical decline since December as well.  I would not be surprised that there is some mini stroke activity that may have contributed to this.  We are counseling as best we can with normal medications to assist in the overall care of the patient. Nutrition has been discussed and patient family was  given access to ensure to experiment with blood patient has an acceptability                             We have also discussed the importance of the spouse and his mental and physical needs and this is been duly noted and made aware.                                                                                  I spent 30 minutes with this patient, More than 50% of that time was spent in face to face education, counseling and care coordination.

## 2020-08-24 ENCOUNTER — Encounter: Payer: Self-pay | Admitting: Emergency Medicine

## 2020-08-24 ENCOUNTER — Ambulatory Visit
Admission: EM | Admit: 2020-08-24 | Discharge: 2020-08-24 | Disposition: A | Discharge status: Home / Self Care | Payer: Medicare Other | Attending: Physician Assistant | Admitting: Physician Assistant

## 2020-08-24 ENCOUNTER — Other Ambulatory Visit: Payer: Self-pay

## 2020-08-24 DIAGNOSIS — L0211 Cutaneous abscess of neck: Secondary | ICD-10-CM | POA: Diagnosis not present

## 2020-08-24 MED ORDER — DOXYCYCLINE HYCLATE 100 MG PO CAPS
100.0000 mg | ORAL_CAPSULE | Freq: Two times a day (BID) | ORAL | 0 refills | Status: AC
Start: 2020-08-24 — End: 2020-09-03

## 2020-08-24 NOTE — ED Provider Notes (Signed)
[facility]    CSN: [id number] Arrival date & time: 08/24/20  0844      History   Chief Complaint Chief Complaint  Patient presents with  . Abscess    HPI Karen Hooper is a 87 y.o. female with history of dementia.  Patient is presenting with her daughter today.  Patient's daughter gives much of the history.  Apparently, patient has had a swollen area on the back of her neck for possibly years.  Daughter says over the past 2 to 3 days she has developed increased swelling, redness and tenderness in this area.  Daughter states that it did drain a little bit of pustular material yesterday.  Patient has been told the past that this is probably a "fatty tumor."  No fevers, fatigue or increased weakness.  Overall, feels well.  Does not take anything for pain relief.  No history of recurrent skin infections or MRSA.  No other complaints or concerns today.  HPI  Past Medical History:  Diagnosis Date  . Allergy   . Anxiety   . Dementia (HCC)   . Hyperlipidemia   . Hypertension     Patient Active Problem List   Diagnosis Date Noted  . Epigastric abdominal pain   . Acute peptic ulcer of stomach   . Postpyloric ulcer   . Abnormal CT scan, small bowel   . CKD (chronic kidney disease) stage 3, GFR 30-59 ml/min (HCC) 06/25/2015  . 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 Allergic rhinitis 11/13/2014  . Anxiety 11/13/2014  . Essential hypertension 11/13/2014  . Routine general medical examination at a health care facility 11/13/2014  . Hot flash, menopausal 11/13/2014  . Screening for depression 11/13/2014    Past Surgical History:  Procedure Laterality Date  . CATARACT EXTRACTION, BILATERAL    . ESOPHAGOGASTRODUODENOSCOPY (EGD) WITH PROPOFOL N/A 09/07/2018   Procedure: ESOPHAGOGASTRODUODENOSCOPY (EGD) WITH PROPOFOL;  Surgeon: Pasty Spillersahiliani, Varnita B, MD;  Location: [facility] ENDOSCOPY;  Service: Endoscopy;  Laterality: N/A;  . EYE SURGERY    .  VAGINAL HYSTERECTOMY      OB History   No obstetric history on file.      Home Medications    Prior to Admission medications   Medication Sig Start Date End Date Taking? Authorizing Provider  calcium carbonate (OS-CAL - DOSED IN MG OF ELEMENTAL CALCIUM) 1250 (500 Ca) MG tablet Take 1 tablet by mouth.   Yes [provider]  cholecalciferol (VITAMIN D3) 25 MCG (1000 UNIT) tablet Take 1,000 Units by mouth daily.   Yes [provider]  doxycycline (VIBRAMYCIN) 100 MG capsule Take 1 capsule (100 mg total) by mouth 2 (two) times daily for 10 days. 08/24/20 09/03/20 Yes Shirlee LatchEaves, Jenyfer Trawick B, PA-C  lamoTRIgine (LAMICTAL) 25 MG tablet Take by mouth. neurology 08/06/20 11/04/20 Yes [provider]  lisinopril (ZESTRIL) 10 MG tablet Take 1 tablet (10 mg total) by mouth daily. 07/06/20  Yes Duanne LimerickJones, Deanna C, MD  loratadine (CLARITIN) 10 MG tablet Take 10 mg by mouth daily.   Yes [provider]  metoprolol succinate (TOPROL-XL) 50 MG 24 hr tablet TAKE (1) TABLET BY MOUTH EVERY DAY 07/06/20  Yes Duanne LimerickJones, Deanna C, MD  mirtazapine (REMERON) 15 MG tablet Take 1 tablet (15 mg total) by mouth at bedtime. 07/06/20  Yes Duanne LimerickJones, Deanna C, MD  Omega 3 1000 MG CAPS Take 2 capsules (2,000 mg total) by mouth 2 (two) times daily. 01/02/20  Yes Duanne LimerickJones, Deanna C, MD  vitamin B-12 (CYANOCOBALAMIN) 500  MCG tablet Take 500 mcg by mouth daily.   Yes [provider]  acetaminophen (TYLENOL) 325 MG tablet Take 325 mg by mouth at bedtime.    [provider]  clotrimazole-betamethasone (LOTRISONE) cream Apply 1 application topically 2 (two) times daily. Patient not taking: No sig reported 07/06/20   Duanne Limerick, MD    Family History Family History  Problem Relation Age of Onset  . Breast cancer Sister 54  . Breast cancer Daughter 28    Social History Social History   Tobacco Use  . Smoking status: Never Smoker  . Smokeless tobacco: Never Used  Vaping Use  . Vaping  Use: Never used  Substance Use Topics  . Alcohol use: No    Alcohol/week: 0.0 standard drinks  . Drug use: Never     Allergies   Atorvastatin, Etodolac, and Ezetimibe   Review of Systems Review of Systems  Constitutional: Negative for fatigue and fever.  Musculoskeletal: Negative for arthralgias, neck pain and neck stiffness.  Skin: Positive for color change. Negative for rash and wound.  Neurological: Negative for weakness.  Hematological: Negative for adenopathy.     Physical Exam Triage Vital Signs ED Triage Vitals  Enc Vitals Group     BP 08/24/20 0908 (!) 151/62     Pulse Rate 08/24/20 0908 (!) 58     Resp 08/24/20 0908 18     Temp 08/24/20 0908 98.4 F (36.9 C)     Temp Source 08/24/20 0908 Oral     SpO2 08/24/20 0908 99 %     Weight 08/24/20 0906 169 lb 15.6 oz (77.1 kg)     Height 08/24/20 0906 5\' 4"  (1.626 m)     Head Circumference --      Peak Flow --      Pain Score --      Pain Loc --      Pain Edu? --      Excl. in GC? --    No data found.  Updated Vital Signs BP (!) 151/62 (BP Location: Right Arm)   Pulse (!) 58   Temp 98.4 F (36.9 C) (Oral)   Resp 18   Ht 5\' 4"  (1.626 m)   Wt 169 lb 15.6 oz (77.1 kg)   SpO2 99%   BMI 29.18 kg/m       Physical Exam Vitals and nursing note reviewed.  Constitutional:      General: She is not in acute distress.    Appearance: Normal appearance. She is not ill-appearing or toxic-appearing.  HENT:     Head: Normocephalic and atraumatic.  Eyes:     General: No scleral icterus.       Right eye: No discharge.        Left eye: No discharge.     Conjunctiva/sclera: Conjunctivae normal.  Cardiovascular:     Rate and Rhythm: Normal rate and regular rhythm.     Heart sounds: Normal heart sounds.  Pulmonary:     Effort: Pulmonary effort is normal. No respiratory distress.     Breath sounds: Normal breath sounds.  Musculoskeletal:     Cervical back: Neck supple.  Skin:    General: Skin is dry.      Findings: Erythema and lesion present. Bruising: ~3 cm x 4 cm oval area of induration and erythema posterior neck. Area is diffusely tender, mild fluctuance. No drainage.  Neurological:     General: No focal deficit present.     Mental Status: She is  alert. Mental status is at baseline.     Motor: No weakness.     Gait: Gait normal.  Psychiatric:        Mood and Affect: Mood normal.        Behavior: Behavior normal.      UC Treatments / Results  Labs (all labs ordered are listed, but only abnormal results are displayed) Labs Reviewed  AEROBIC/ANAEROBIC CULTURE W GRAM STAIN (SURGICAL/DEEP WOUND)    EKG   Radiology No results found.  Procedures Incision and Drainage  Date/Time: 08/24/2020 10:05 AM Performed by: Shirlee Latch, PA-C Authorized by: Shirlee Latch, PA-C   Consent:    Consent obtained:  Verbal   Consent given by:  Patient   Risks discussed:  Bleeding, incomplete drainage, pain and damage to other organs   Alternatives discussed:  No treatment Universal protocol:    Procedure explained and questions answered to patient or proxy's satisfaction: yes     Relevant documents present and verified: yes     Test results available : yes     Imaging studies available: yes     Required blood products, implants, devices, and special equipment available: yes     Site/side marked: yes     Immediately prior to procedure, a time out was called: yes     Patient identity confirmed:  Verbally with patient Location:    Type:  Abscess   Size:  3 cm x4 cm Pre-procedure details:    Skin preparation:  Povidone-iodine Anesthesia:    Anesthesia method:  Local infiltration   Local anesthetic:  Lidocaine 1% WITH epi Procedure type:    Complexity:  Simple Procedure details:    Incision types:  Single straight   Incision depth:  Subcutaneous   Scalpel blade:  11   Wound management:  Probed and deloculated and irrigated with saline   Drainage:  Purulent   Drainage amount:   Moderate   Packing materials:  1/2 in gauze Post-procedure details:    Procedure completion:  Tolerated well, no immediate complications   (including critical care time)  Medications Ordered in UC Medications - No data to display  Initial Impression / Assessment and Plan / UC Course  I have reviewed the triage vital signs and the nursing notes.  Pertinent labs & imaging results that were available during my care of the patient were reviewed by me and considered in my medical decision making (see chart for details).   87 year old female with history of dementia presenting with daughter for abscess of neck.  Incision and drainage of the abscess performed.  Purulent material drained.  Abscess fluid sent for culture.  Treating patient at this time with doxycycline.  Advised her to return in 2 days for wound recheck and possible repacking.  Did discuss with patient's daughter the fact that this could be an epidermal/inclusion cyst and she may need referral to dermatology if it does not getting better with the antibiotics after the I&D.  ED precautions reviewed.   Final Clinical Impressions(s) / UC Diagnoses   Final diagnoses:  Abscess of neck     Discharge Instructions     Follow up in 2 days for re-check and possible re-packing. Follow up sooner if increasing pain, swelling, fever. Take full course of antibiotics. May need referral to dermatology from PCP if not improving with antibiotics as I suspect she had a cyst there for quite sometime and they can recur    ED Prescriptions    Medication Sig  Dispense Auth. Provider   doxycycline (VIBRAMYCIN) 100 MG capsule Take 1 capsule (100 mg total) by mouth 2 (two) times daily for 10 days. 20 capsule Shirlee Latch, PA-C     PDMP not reviewed this encounter.   Shirlee Latch, PA-C 08/24/20 1012

## 2020-08-24 NOTE — Discharge Instructions (Signed)
Follow up in 2 days for re-check and possible re-packing. Follow up sooner if increasing pain, swelling, fever. Take full course of antibiotics. May need referral to dermatology from PCP if not improving with antibiotics as I suspect she had a cyst there for quite sometime and they can recur

## 2020-08-24 NOTE — ED Triage Notes (Signed)
Pt has an abscess on the left side of the back of her neck. Drained some last night. She has had this for a while but recently flared up.

## 2020-08-26 ENCOUNTER — Ambulatory Visit
Admission: EM | Admit: 2020-08-26 | Discharge: 2020-08-26 | Disposition: A | Discharge status: Home / Self Care | Payer: Medicare Other

## 2020-08-26 ENCOUNTER — Other Ambulatory Visit: Payer: Self-pay

## 2020-08-26 ENCOUNTER — Encounter: Payer: Self-pay | Admitting: Emergency Medicine

## 2020-08-26 DIAGNOSIS — L723 Sebaceous cyst: Secondary | ICD-10-CM

## 2020-08-26 NOTE — ED Triage Notes (Signed)
Patient here for a recheck on her abscess on her neck.

## 2020-08-26 NOTE — ED Provider Notes (Signed)
[facility]    CSN: [id number] Arrival date & time: 08/26/20  0804      History   Chief Complaint Chief Complaint  Patient presents with  . Abscess    HPI Karen Hooper is a 87 y.o. female who presents with her son for FU posterior neck abscess. Had I&D 2 days ago. Had packing but she pulled it off. There has been moderate drainage on dressing. She is taking her antibiotics. I reviewed the culture and was negative.     Past Medical History:  Diagnosis Date  . Allergy   . Anxiety   . Dementia (HCC)   . Hyperlipidemia   . Hypertension     Patient Active Problem List   Diagnosis Date Noted  . Epigastric abdominal pain   . Acute peptic ulcer of stomach   . Postpyloric ulcer   . Abnormal CT scan, small bowel   . CKD (chronic kidney disease) stage 3, GFR 30-59 ml/min (HCC) 06/25/2015  . 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 Allergic rhinitis 11/13/2014  . Anxiety 11/13/2014  . Essential hypertension 11/13/2014  . Routine general medical examination at a health care facility 11/13/2014  . Hot flash, menopausal 11/13/2014  . Screening for depression 11/13/2014    Past Surgical History:  Procedure Laterality Date  . CATARACT EXTRACTION, BILATERAL    . ESOPHAGOGASTRODUODENOSCOPY (EGD) WITH PROPOFOL N/A 09/07/2018   Procedure: ESOPHAGOGASTRODUODENOSCOPY (EGD) WITH PROPOFOL;  Surgeon: Pasty Spillers, MD;  Location: [facility] END[facility];  Service: Endoscopy;  Laterality: N/A;  . EYE SURGERY    . VAGINAL HYSTERECTOMY      OB History   No obstetric history on file.      Home Medications    Prior to Admission medications   Medication Sig Start Date End Date Taking? Authorizing Provider  acetaminophen (TYLENOL) 325 MG tablet Take 325 mg by mouth at bedtime.   Yes [provider]  calcium carbonate (OS-CAL - DOSED IN MG OF ELEMENTAL CALCIUM) 1250 (500 Ca) MG tablet Take 1 tablet by mouth.   Yes [provider]   cholecalciferol (VITAMIN D3) 25 MCG (1000 UNIT) tablet Take 1,000 Units by mouth daily.   Yes [provider]  clotrimazole-betamethasone (LOTRISONE) cream Apply 1 application topically 2 (two) times daily. 07/06/20  Yes Duanne Limerick, MD  doxycycline (VIBRAMYCIN) 100 MG capsule Take 1 capsule (100 mg total) by mouth 2 (two) times daily for 10 days. 08/24/20 09/03/20 Yes Shirlee Latch, PA-C  lamoTRIgine (LAMICTAL) 25 MG tablet Take by mouth. neurology 08/06/20 11/04/20 Yes [provider]  lisinopril (ZESTRIL) 10 MG tablet Take 1 tablet (10 mg total) by mouth daily. 07/06/20  Yes Duanne Limerick, MD  loratadine (CLARITIN) 10 MG tablet Take 10 mg by mouth daily.   Yes [provider]  metoprolol succinate (TOPROL-XL) 50 MG 24 hr tablet TAKE (1) TABLET BY MOUTH EVERY DAY 07/06/20  Yes Duanne Limerick, MD  mirtazapine (REMERON) 15 MG tablet Take 1 tablet (15 mg total) by mouth at bedtime. 07/06/20  Yes Duanne Limerick, MD  Omega 3 1000 MG CAPS Take 2 capsules (2,000 mg total) by mouth 2 (two) times daily. 01/02/20  Yes Duanne Limerick, MD  vitamin B-12 (CYANOCOBALAMIN) 500 MCG tablet Take 500 mcg by mouth daily.   Yes [provider]    Family History Family History  Problem Relation Age of Onset  . Breast cancer Sister 20  . Breast cancer Daughter 6  Social History Social History   Tobacco Use  . Smoking status: Never Smoker  . Smokeless tobacco: Never Used  Vaping Use  . Vaping Use: Never used  Substance Use Topics  . Alcohol use: No    Alcohol/week: 0.0 standard drinks  . Drug use: Never     Allergies   Atorvastatin, Etodolac, and Ezetimibe   Review of Systems Review of Systems  Skin: Positive for wound.       From abscess on posterior neck     Physical Exam Triage Vital Signs ED Triage Vitals  Enc Vitals Group     BP 08/26/20 0814 (!) 134/57     Pulse Rate 08/26/20 0814 (!) 57     Resp 08/26/20 0814 18     Temp 08/26/20 0814  98.5 F (36.9 C)     Temp Source 08/26/20 0814 Oral     SpO2 08/26/20 0814 98 %     Weight 08/26/20 0812 169 lb 15.6 oz (77.1 kg)     Height 08/26/20 0812 5\' 4"  (1.626 m)     Head Circumference --      Peak Flow --      Pain Score 08/26/20 0812 0     Pain Loc --      Pain Edu? --      Excl. in GC? --    No data found.  Updated Vital Signs BP (!) 134/57 (BP Location: Left Arm)   Pulse (!) 57   Temp 98.5 F (36.9 C) (Oral)   Resp 18   Ht 5\' 4"  (1.626 m)   Wt 169 lb 15.6 oz (77.1 kg)   SpO2 98%   BMI 29.18 kg/m   Visual Acuity Right Eye Distance:   Left Eye Distance:   Bilateral Distance:    Right Eye Near:   Left Eye Near:    Bilateral Near:     Physical Exam Vitals and nursing note reviewed.  Constitutional:      General: She is not in acute distress.    Appearance: She is not toxic-appearing.  HENT:     Right Ear: External ear normal.     Left Ear: External ear normal.  Eyes:     General: No scleral icterus.    Conjunctiva/sclera: Conjunctivae normal.  Pulmonary:     Effort: Pulmonary effort is normal.  Musculoskeletal:     Cervical back: Neck supple.  Skin:    General: Skin is warm and dry.     Comments: NECK- posterior neck with 1x1 cm wound with granulation matter in the center. Area was cleansed with surecleanse and saline and I squeezed the outline area and moderate amount of sebum was drained. New dressing applied  Neurological:     Mental Status: She is alert.  Psychiatric:        Mood and Affect: Mood normal.        Behavior: Behavior normal.      UC Treatments / Results  Labs (all labs ordered are listed, but only abnormal results are displayed) Labs Reviewed - No data to display  EKG   Radiology No results found.  Procedures Procedures (including critical care time)  Medications Ordered in UC Medications - No data to display  Initial Impression / Assessment and Plan / UC Course  I have reviewed the triage vital signs and the  nursing notes. Pertinent labs results that were available during my care of the patient were reviewed by me and considered in my medical decision making (  see chart for details). Inflamed sebaceous cyst posterior neck. Wound care instructions reviewed with her son. She may d/c the antibiotic Final Clinical Impressions(s) / UC Diagnoses   Final diagnoses:  None   Discharge Instructions   None    ED Prescriptions    None     PDMP not reviewed this encounter.   Garey Ham, [place] 08/26/20 [phone]

## 2020-08-27 ENCOUNTER — Telehealth: Payer: Self-pay | Admitting: Family Medicine

## 2020-08-27 NOTE — Telephone Encounter (Signed)
Home Health Verbal Orders - Caller/Agency: [facility] Number: [phone] Requesting OT/PT/Skilled Nursing/Social Work/Speech Therapy: Hospice Care Frequency:   Wants to know if PCP will be the attending MD for hospice orders

## 2020-08-27 NOTE — Telephone Encounter (Signed)
Called and left message to proceed with pt/ ot/ nursing and speech. If it gets to the point of morphine or other meds- hospice facility doctor will have to take over

## 2020-08-29 LAB — AEROBIC/ANAEROBIC CULTURE W GRAM STAIN (SURGICAL/DEEP WOUND): Special Requests: NORMAL

## 2020-09-09 NOTE — Telephone Encounter (Signed)
Pt is having increased lower extremity pain, has been taking tylenol with minimal effectiveness  Jeanine, calling from [facility]   [phone]

## 2020-09-10 NOTE — Telephone Encounter (Signed)
Spoke to [facility]- she will get Dr Dan Humphreys to prescribe pain meds

## 2020-11-06 ENCOUNTER — Ambulatory Visit: Payer: Medicare Other | Admitting: Family Medicine

## 2020-11-08 DEATH — deceased
